# Patient Record
Sex: Male | Born: 1960 | Race: Black or African American | Hispanic: No | Marital: Married | Smoking: Never smoker
Health system: Southern US, Community
[De-identification: ages and names within clinical notes are randomized; demographics above are authoritative.]

## PROBLEM LIST (undated history)

## (undated) DIAGNOSIS — E7800 Pure hypercholesterolemia, unspecified: Secondary | ICD-10-CM

## (undated) DIAGNOSIS — I10 Essential (primary) hypertension: Secondary | ICD-10-CM

---

## 2006-08-16 ENCOUNTER — Ambulatory Visit: Payer: Self-pay | Admitting: Unknown Physician Specialty

## 2007-12-05 ENCOUNTER — Other Ambulatory Visit: Payer: Self-pay

## 2007-12-05 ENCOUNTER — Inpatient Hospital Stay: Payer: Self-pay | Admitting: Internal Medicine

## 2012-11-06 ENCOUNTER — Inpatient Hospital Stay: Payer: Self-pay | Admitting: Student

## 2012-11-06 LAB — CK TOTAL AND CKMB (NOT AT ARMC)
CK, Total: 162 U/L (ref 35–232)
CK, Total: 306 U/L — ABNORMAL HIGH (ref 35–232)
CK-MB: 3.1 ng/mL (ref 0.5–3.6)
CK-MB: 18.5 ng/mL — ABNORMAL HIGH (ref 0.5–3.6)
CK-MB: 23 ng/mL — ABNORMAL HIGH (ref 0.5–3.6)

## 2012-11-06 LAB — APTT
Activated PTT: 138.7 secs — ABNORMAL HIGH (ref 23.6–35.9)
Activated PTT: >160 secs (ref 23.6–35.9)

## 2012-11-06 LAB — CBC WITH DIFFERENTIAL/PLATELET
Basophil #: 0.1 10*3/uL (ref 0.0–0.1)
Basophil %: 1.2 %
Eosinophil %: 1.4 %
HGB: 15 g/dL (ref 13.0–18.0)
Lymphocyte %: 25.3 %
MCH: 30.4 pg (ref 26.0–34.0)
MCV: 88 fL (ref 80–100)
Monocyte #: 0.7 x10 3/mm (ref 0.2–1.0)
Monocyte %: 8.6 %
Neutrophil %: 63.5 %
Platelet: 221 10*3/uL (ref 150–440)
RBC: 4.93 10*6/uL (ref 4.40–5.90)
WBC: 8.5 10*3/uL (ref 3.8–10.6)

## 2012-11-06 LAB — COMPREHENSIVE METABOLIC PANEL
Albumin: 3.7 g/dL (ref 3.4–5.0)
Alkaline Phosphatase: 117 U/L (ref 50–136)
Anion Gap: 8 (ref 7–16)
BUN: 12 mg/dL (ref 7–18)
Chloride: 109 mmol/L — ABNORMAL HIGH (ref 98–107)
Co2: 25 mmol/L (ref 21–32)
EGFR (African American): >60
Glucose: 100 mg/dL — ABNORMAL HIGH (ref 65–99)
Osmolality: 283 (ref 275–301)
Potassium: 3.7 mmol/L (ref 3.5–5.1)
SGOT(AST): 28 U/L (ref 15–37)
SGPT (ALT): 24 U/L (ref 12–78)
Sodium: 142 mmol/L (ref 136–145)
Total Protein: 7.7 g/dL (ref 6.4–8.2)

## 2012-11-06 LAB — CBC
HGB: 15 g/dL (ref 13.0–18.0)
MCH: 29.8 pg (ref 26.0–34.0)
MCV: 88 fL (ref 80–100)
Platelet: 223 10*3/uL (ref 150–440)
RBC: 5.04 10*6/uL (ref 4.40–5.90)
RDW: 13.8 % (ref 11.5–14.5)
WBC: 8.4 10*3/uL (ref 3.8–10.6)

## 2012-11-06 LAB — TROPONIN I: Troponin-I: 2.4 ng/mL — ABNORMAL HIGH

## 2012-11-07 LAB — CBC WITH DIFFERENTIAL/PLATELET
Basophil #: 0.1 10*3/uL (ref 0.0–0.1)
Basophil %: 1.5 %
HCT: 42.1 % (ref 40.0–52.0)
HGB: 14.4 g/dL (ref 13.0–18.0)
Lymphocyte %: 36.8 %
MCHC: 34.1 g/dL (ref 32.0–36.0)
MCV: 90 fL (ref 80–100)
Monocyte %: 8.6 %
Neutrophil #: 4 10*3/uL (ref 1.4–6.5)
WBC: 7.8 10*3/uL (ref 3.8–10.6)

## 2012-11-07 LAB — LIPID PANEL
HDL Cholesterol: 42 mg/dL (ref 40–60)
Ldl Cholesterol, Calc: 149 mg/dL — ABNORMAL HIGH (ref 0–100)

## 2012-11-07 LAB — BASIC METABOLIC PANEL
Calcium, Total: 8.7 mg/dL (ref 8.5–10.1)
Co2: 21 mmol/L (ref 21–32)
Creatinine: 0.7 mg/dL (ref 0.60–1.30)
Glucose: 102 mg/dL — ABNORMAL HIGH (ref 65–99)
Osmolality: 282 (ref 275–301)
Potassium: 4.3 mmol/L (ref 3.5–5.1)
Sodium: 142 mmol/L (ref 136–145)

## 2012-11-07 LAB — APTT: Activated PTT: 98.6 secs — ABNORMAL HIGH (ref 23.6–35.9)

## 2012-11-08 LAB — CK TOTAL AND CKMB (NOT AT ARMC)
CK, Total: 143 U/L (ref 35–232)
CK-MB: 5.5 ng/mL — ABNORMAL HIGH (ref 0.5–3.6)

## 2012-11-08 LAB — BASIC METABOLIC PANEL
Anion Gap: 6 — ABNORMAL LOW (ref 7–16)
Calcium, Total: 8.6 mg/dL (ref 8.5–10.1)
Co2: 26 mmol/L (ref 21–32)
Creatinine: 0.85 mg/dL (ref 0.60–1.30)
EGFR (African American): >60
EGFR (Non-African Amer.): >60
Osmolality: 276 (ref 275–301)
Potassium: 3.7 mmol/L (ref 3.5–5.1)

## 2014-10-01 DIAGNOSIS — I2510 Atherosclerotic heart disease of native coronary artery without angina pectoris: Secondary | ICD-10-CM | POA: Diagnosis present

## 2014-12-10 DIAGNOSIS — E782 Mixed hyperlipidemia: Secondary | ICD-10-CM | POA: Insufficient documentation

## 2015-03-01 ENCOUNTER — Emergency Department: Payer: Self-pay | Admitting: Emergency Medicine

## 2015-04-13 NOTE — H&P (Signed)
PATIENT NAME:  Nathaniel Hobbs, Nathaniel Hobbs MR#:  [id number] DATE OF BIRTH:  1961-09-14  DATE OF ADMISSION:  11/06/2012  PRIMARY CARE PHYSICIAN: [facility], evaluated by [facility] in the past   CHIEF COMPLAINT: Chest pressure.   HISTORY OF PRESENT ILLNESS: The patient is a 54 year old pleasant African American male with a past medical history of acute myocardial infarction status post cardiac cath in the year 2008 by Dr. Gwen Pounds which revealed an ejection fraction of 60%, proximal left anterior descending 40% stenosis, first diagonal 80% stenosis regarding which medical management was recommended in the year 2008. The patient has other medical problems of hypertension and hyperlipidemia. He was in his usual state of health until 6 p.m. yesterday. While he was resting suddenly he started having chest pressure in the midsternal area close to the neck which radiated to right shoulder and shoulder started cramping at around 6 p.m. Eventually he felt like heartburn and he took antacid. Eventually he felt diffuse chest pressure both on the right and left side of the chest and felt like someone was sitting on his chest. This was not associated with any nausea, vomiting, dizziness, shortness of breath, or diaphoresis. Denies any headache or blurry vision. The patient took two 81 mg aspirin following which he felt a little better. The patient's wife drove him to the ER. In the ER the patient was given one sublingual nitroglycerin following which his chest pressure was significantly improved. The patient's cardiac enzymes have revealed a total CK of 162, CPK-MB 3.1, and Troponin-I at 0.35. Initial 12-lead EKG at 1:53 a.m. has revealed 1-1/2 small boxes of ST elevation from leads V1 through V5. The patient was given heparin bolus and heparin drip was initiated. Subsequently, the repeat EKG at 2:06 a.m. has revealed normal sinus rhythm at 72 beats per minute, normal PR interval at 160, and corrected QTc  of 407 ms. ST elevations which were noted on the prior EKG from leads V1 through V5 were resolved. Hospitalist team is called to admit the patient for acute MI. The patient was placed on oxygen. During my examination the patient is reporting that his chest pressure is significantly improved and he started feeling better. The patient felt comfortable to get admitted to [facility] for further management of his heart attack. I had called and notified Dr. Darrold Junker who is on call for [facility]. The patient's wife is at bedside during my examination.   PAST MEDICAL HISTORY:  1. Hypertension. 2. Hyperlipidemia. 3. Acute MI in the year 2008. At that time he had a cardiac cath and medical management was recommended.   PAST SURGICAL HISTORY: Cardiac cath in the year 2008 which was done by Dr. Gwen Pounds, showed ejection fraction of 50%, proximal left anterior descending 40% stenosis and first diagonal 80% stenosis. Medical management was recommended.   ALLERGIES: The patient has no known drug allergies.   HOME MEDICATION LIST:  1. Lisinopril 20 mg 1 tablet p.o. once daily.  2. Lipitor 10 mg once daily. 3. Aspirin 325 mg once daily.   PSYCHOSOCIAL HISTORY: Lives at home with wife. Denies smoking. Occasional intake of alcohol. Denies any illicit drug intake.    FAMILY HISTORY: Father had heart attack at age 63.  REVIEW OF SYSTEMS: CONSTITUTIONAL: Denies any fever, fatigue, weakness, pain, weight loss or weight gain. EYES: Denies any blurry vision, double vision, redness, tinnitus, postnasal drip. ENT: Denies postnasal drip, snoring, or difficulty in swallowing. RESPIRATORY: Denies coughing, wheezing, COPD, tuberculosis, or  pneumonia. CARDIOVASCULAR: Complained of chest pressure which is resolved after giving sublingual nitroglycerin. Denies any syncope, varicose veins, or palpitations. GI: Denies nausea, vomiting, diarrhea, abdominal pain, hematemesis, melena, ulcers, or  GERD. GENITOURINARY: Denies dysuria, hematuria. ENDOCRINE: Denies polyuria, polydipsia, nocturia. HEME/LYMPH: Denies anemia, easy bruising or bleeding. INTEGUMENTARY: Denies acne, rash, lesions. MUSCULOSKELETAL: The patient is complaining of right shoulder cramping but denies any knee pain, hip pain, swelling, gout. NEUROLOGIC: The patient denies any numbness, weakness, dysarthria, dementia, memory loss. PSYCH: The patient denies any insomnia, ADD, bipolar disorder, or depression.   PHYSICAL EXAMINATION:   VITAL SIGNS: Temperature 98.5, pulse 68, respiratory rate 18, blood pressure 156/92.   GENERAL APPEARANCE: Not in acute distress, well built and well nourished.   HEENT: Normocephalic, atraumatic. Pupils are equal and reactive to light and accommodation. Moist mucous membranes. No postnasal drip.   NECK: Supple. No JVD. No carotid bruits. No thyromegaly.   LUNGS: Clear to auscultation bilaterally. No wheezing. No crackles.   CARDIAC: S1, S2 normal. Regular rate and rhythm. No reproducible chest tenderness on palpation. Point of maximal index is intact. No thrills.   GI: Soft. Bowel sounds are positive in all four quadrants. Nontender, nondistended. No masses.   NEUROLOGIC: Alert and oriented x3. Cranial nerves II through XII are grossly intact. Motor and sensory intact. Reflexes are 2+.   SIGNIFICANT LABS AND IMAGING STUDIES: Serum glucose 100, sodium 142 potassium 3.7, chloride 109, CO2 25, BUN 12, creatinine 0.97, calcium 8.6, serum osmolality 283, anion gap 8, GFR greater than 60, total protein 7.7, albumin 3.7, AST 28, ALT 24. CK total 162. CK-MB 3.1. Troponin-I 0.35. WBC 8400, hemoglobin 15.0, hematocrit 44.5, platelet count 223,000, MCV 88.   Chest x-ray has revealed prominent vasculature and peribronchial cuffing.   12-lead EKG at 1:53 a.m. has revealed 1-1/2 small boxes, ST elevations from lead V1 through V5. Repeat EKG at 2:06 a.m. has revealed normal sinus rhythm at 72 beats per  minute. The ST elevations from V1 through V5 which were noted at 1:53 a.m. were resolved. PR interval is normal. Carter QTc at 407 ms.   ASSESSMENT AND PLAN: This is a 54 year old African American male presenting to the ER with a chief complaint of chest pressure with radiation to the right shoulder with cramping who will be admitted with the following assessment and plan: 1. Acute MI status post resolution of ST elevation/positive troponin with past medical history of coronary artery disease, acute MI in the year 2008, status post cardiac cath in 2008 and recommended medical management. Admit to Intensive Care Unit. Heparin bolus was given in the ER and will continue heparin drip. ACS protocol will be implemented with oxygen, nitroglycerin, aspirin, beta-blocker, statin, and ACE inhibitor. Will also provide him Plavix. Cardiology consult is placed and notified Dr. Darrold Junker. Discussed about diagnosis and lab results. Dr. Darrold Junker' group will see the patient as soon as possible. Will check fasting lipid panel.  2. Hypertension. Blood pressure is slightly elevated. Will resume his home medication, lisinopril, and restart metoprolol.  3. Hyperlipidemia. Check cholesterol panel in a.m. Will continue statin.  4. GI prophylaxis will be given with Protonix. 5. DVT prophylaxis is not needed as the patient is on heparin drip.   CODE STATUS: FULL CODE.   The diagnosis and plan of care was discussed in detail with the patient and his wife who is at bedside. They both voiced understanding of the plan.   CONDITION: Critical.   TOTAL CRITICAL CARE TIME SPENT: 60 minutes.  ____________________________ Ramonita LabAruna Wise Fees, MD ag:drc D: 11/06/2012 05:18:43 ET T: 11/06/2012 07:09:38 ET JOB#: [id number]  cc: Ramonita LabAruna Zebulin Siegel, MD, <Dictator> Marcina MillardAlexander Paraschos, MD Ramonita LabARUNA Braxtyn Bojarski MD ELECTRONICALLY SIGNED 11/16/2012 0:54

## 2015-04-13 NOTE — Consult Note (Signed)
General Aspect 54 yo male with history of cad by cardeiac cath in 2009 revealing normal lv function, 40% mid lad, 80% D1 wsmall veswsel treated medically. He also has hypertension and hyperlipidemia. He is now admitted iwth chest pain and has ruled in for a nstemi. He has not seen a cardiologist in 4 years. He states he has been compliant with his meds. He is currently hemodynamically stable. Mild chest tightness.   Physical Exam:   GEN no acute distress, obese    HEENT PERRL, hearing intact to voice    NECK supple    RESP normal resp effort    CARD Regular rate and rhythm  Normal, S1, S2  No murmur    ABD denies tenderness  normal BS    LYMPH negative neck    EXTR negative cyanosis/clubbing, negative edema    SKIN normal to palpation    NEURO cranial nerves intact, motor/sensory function intact    PSYCH A+O to time, place, person   Review of Systems:   Subjective/Chief Complaint chest pain    General: No Complaints    Skin: No Complaints    ENT: No Complaints    Eyes: No Complaints    Neck: No Complaints    Respiratory: No Complaints    Cardiovascular: Chest pain or discomfort  Tightness    Gastrointestinal: No Complaints    Genitourinary: No Complaints    Vascular: No Complaints    Musculoskeletal: No Complaints    Neurologic: No Complaints    Hematologic: No Complaints    Endocrine: No Complaints    Psychiatric: No Complaints    Review of Systems: All other systems were reviewed and found to be negative    Medications/Allergies Reviewed Medications/Allergies reviewed     Hypercholesterolemia:    MI:    Denies medical history:    arthroscopic R knee:   Home Medications: Medication Instructions Status  lisinopril tablet 20 mg 1 tab(s) orally once a day  Active  aspirin tablet 325 mg 1 tab(s) orally once a day  Active  Lipitor 10 mg oral tablet 1 tab(s) orally once a day (at bedtime) Active   EKG:   EKG NSR   Radiology  Results: XRay:    13-Nov-13 02:29, Chest PA and Lateral   Chest PA and Lateral    REASON FOR EXAM:    chest pain  COMMENTS:       PROCEDURE: DXR - DXR CHEST PA (OR AP) AND LATERAL  - Nov 06 2012  2:29AM     RESULT: The lungs are clear. The heart and pulmonary vessels are normal.   The bony and mediastinal structures are unremarkable. There is no   effusion. There is no pneumothorax or evidence of congestive failure.    IMPRESSION:  No acute cardiopulmonary disease.    Dictation Site: 2          Verified By: Elveria RoyalsGEOFFREY H. BROWNE, M.D., MD    No Known Allergies:     Impression 54 yo patient with complaints of chest pain who has ruled in for an nstemi with serum tropoinin of 3.1,. Had cardiac cath inn 2008 after nstemi which reveald mild lad disease with80% stenosis in small d1 treated medically. Now with recurrent chest pain. Hemodynaically stable. Had pain at rest prior to admission.    Plan 1. Continue current meds including asa, metoprolol, statin. Pt was put on plavix in er so will ontinue for now.  2. Risk and benefits of cardaic cath explained  to patient and he agrees to proceed.  3. Further recs after cath.   Electronic Signatures: Dalia Heading (MD)  (Signed [phone] 09:09)  Authored: General Aspect/Present Illness, History and Physical Exam, Review of System, Past Medical History, Home Medications, EKG , Radiology, Allergies, Impression/Plan   Last Updated: 13-Nov-13 09:09 by Dalia Heading (MD)

## 2015-04-13 NOTE — Discharge Summary (Signed)
PATIENT NAME:  Anastasio AuerbachDIGGINS, Markcus MR#:  [id number] DATE OF BIRTH:  [birth year]  DATE OF ADMISSION:  11/06/2012 DATE OF DISCHARGE:  11/08/2012  CHIEF COMPLAINT: Chest pain.   CONSULTANT: Dr. Lady GaryFath  [facility] PH[facility]IC[facility]  DISCHARGE DIAGNOSES:  1. Acute myocardial infarction status post staged catheter and stent to mid left anterior descending.   2. Hypertension.  3. Hyperlipidemia.  4. History of coronary artery disease status post myocardial infarction in the past.   DISCHARGE MEDICATIONS:  1. Plavix 75 mg daily.  2. Enalapril 10 mg daily.  3. Aspirin 81 mg daily.  4. Nitroglycerin sublingual 0.4 mg every five minutes x3 as needed for chest pain. 5. Simvastatin 40 mg daily.   6. Metoprolol tartrate 25 mg 2 times a day.   DIET: Low sodium.   ACTIVITY: As tolerated.   FOLLOW UP: Please follow with your primary care physician and cardiologist within 1 to 2 weeks and Dr. Lady GaryFath will give you an appointment and please follow with him.   DISPOSITION: Home.   HISTORY OF PRESENT ILLNESS: For full details of history and physical, please see the dictation on 11/06/2012 by Dr. Amado CoeGouru but briefly this is a 54 year old male with history of coronary artery disease status post catheterization and myocardial infarction in the past who presented with chest pain and on arrival had significant ST changes and was admitted to the hospitalist service with heparin drip, aspirin, Plavix and cardiology was consulted.   LABORATORY, DIAGNOSTIC AND RADIOLOGICAL DATA: Initial BUN 12, creatinine 0.97. Today creatinine is 0.85. LDL 149, total cholesterol 234, serum triglycerides 214. LFTs on arrival within normal limits. Initial troponin 0.35, next troponin 2.4, next troponin 3.8. Initial CK-MB 3.1, peak was 23, last CK-MB 5.5. WBC 8.4 on arrival, hemoglobin 15, platelets 223. Cardiac catheterization on 11/13 showing severe apical hypokinesia, 10% distal left main stenosis, mid LAD there was 95% stenosis,  first diagonal there was 100% stenosis, second diagonal there was 75% stenosis, proximal circ there was 75% stenosis, first obtuse marginal there was 75% stenosis.   HOSPITAL COURSE: Patient was admitted to the hospitalist service with heparin drip, aspirin, Plavix and cardiology was consulted. Patient had significant ST changes on arrival and symptoms improved with nitroglycerin. Patient was taken to the Cath Lab on 11/13 the result of which is above and the culprit vessel was the LAD lesion. He was taken back to the floor and per Dr. Lady GaryFath the images [facility] and decision to retake the patient to the Cath Lab for stenting of the mid LAD lesion was made. He underwent successful stenting on the 14th and currently is chest pain free. I had several conversations with the patient in regards to the progressive nature of his coronary artery disease and the possible need for CABG down the line. The CK-MB has trended down and he is chest pain free. He was instructed to follow with Dr. Lady GaryFath and his PCP. An echocardiogram done on 11/13 showed ejection fraction of 45% to 50%. This is likely cardiomyopathy and not acute congestive heart failure. He will be discharged with beta blocker and ACE inhibitor in addition to his other medications dictated above.   TOTAL TIME SPENT: 35 minutes.   CODE STATUS: Patient is FULL CODE.  ____________________________ Krystal EatonShayiq Janellie Tennison, MD sa:cms D: 11/08/2012 09:28:50 ET T: 11/08/2012 10:57:05 ET JOB#: [id number] cc: Krystal EatonShayiq Kesler Wickham, MD, <Dictator> [facility] Marcelle SmilingSHAYIQ [facility] MD ELECTRONICALLY SIGNED 12/05/2012 11:03

## 2015-10-28 DIAGNOSIS — I10 Essential (primary) hypertension: Secondary | ICD-10-CM | POA: Diagnosis present

## 2015-11-02 DIAGNOSIS — F411 Generalized anxiety disorder: Secondary | ICD-10-CM | POA: Insufficient documentation

## 2016-06-01 IMAGING — CR DG CHEST 2V
1 series · 2 of 2 positions shown · non-contrast
Comparison: 11/06/2012

CLINICAL DATA: Anterior chest pain for 1 week

EXAM:
CHEST  2 VIEW

[Series 1: dxr chest pa (or ap) and lateral · 0.14mm/px · 2 of 2 slices shown]
[im 1/2]
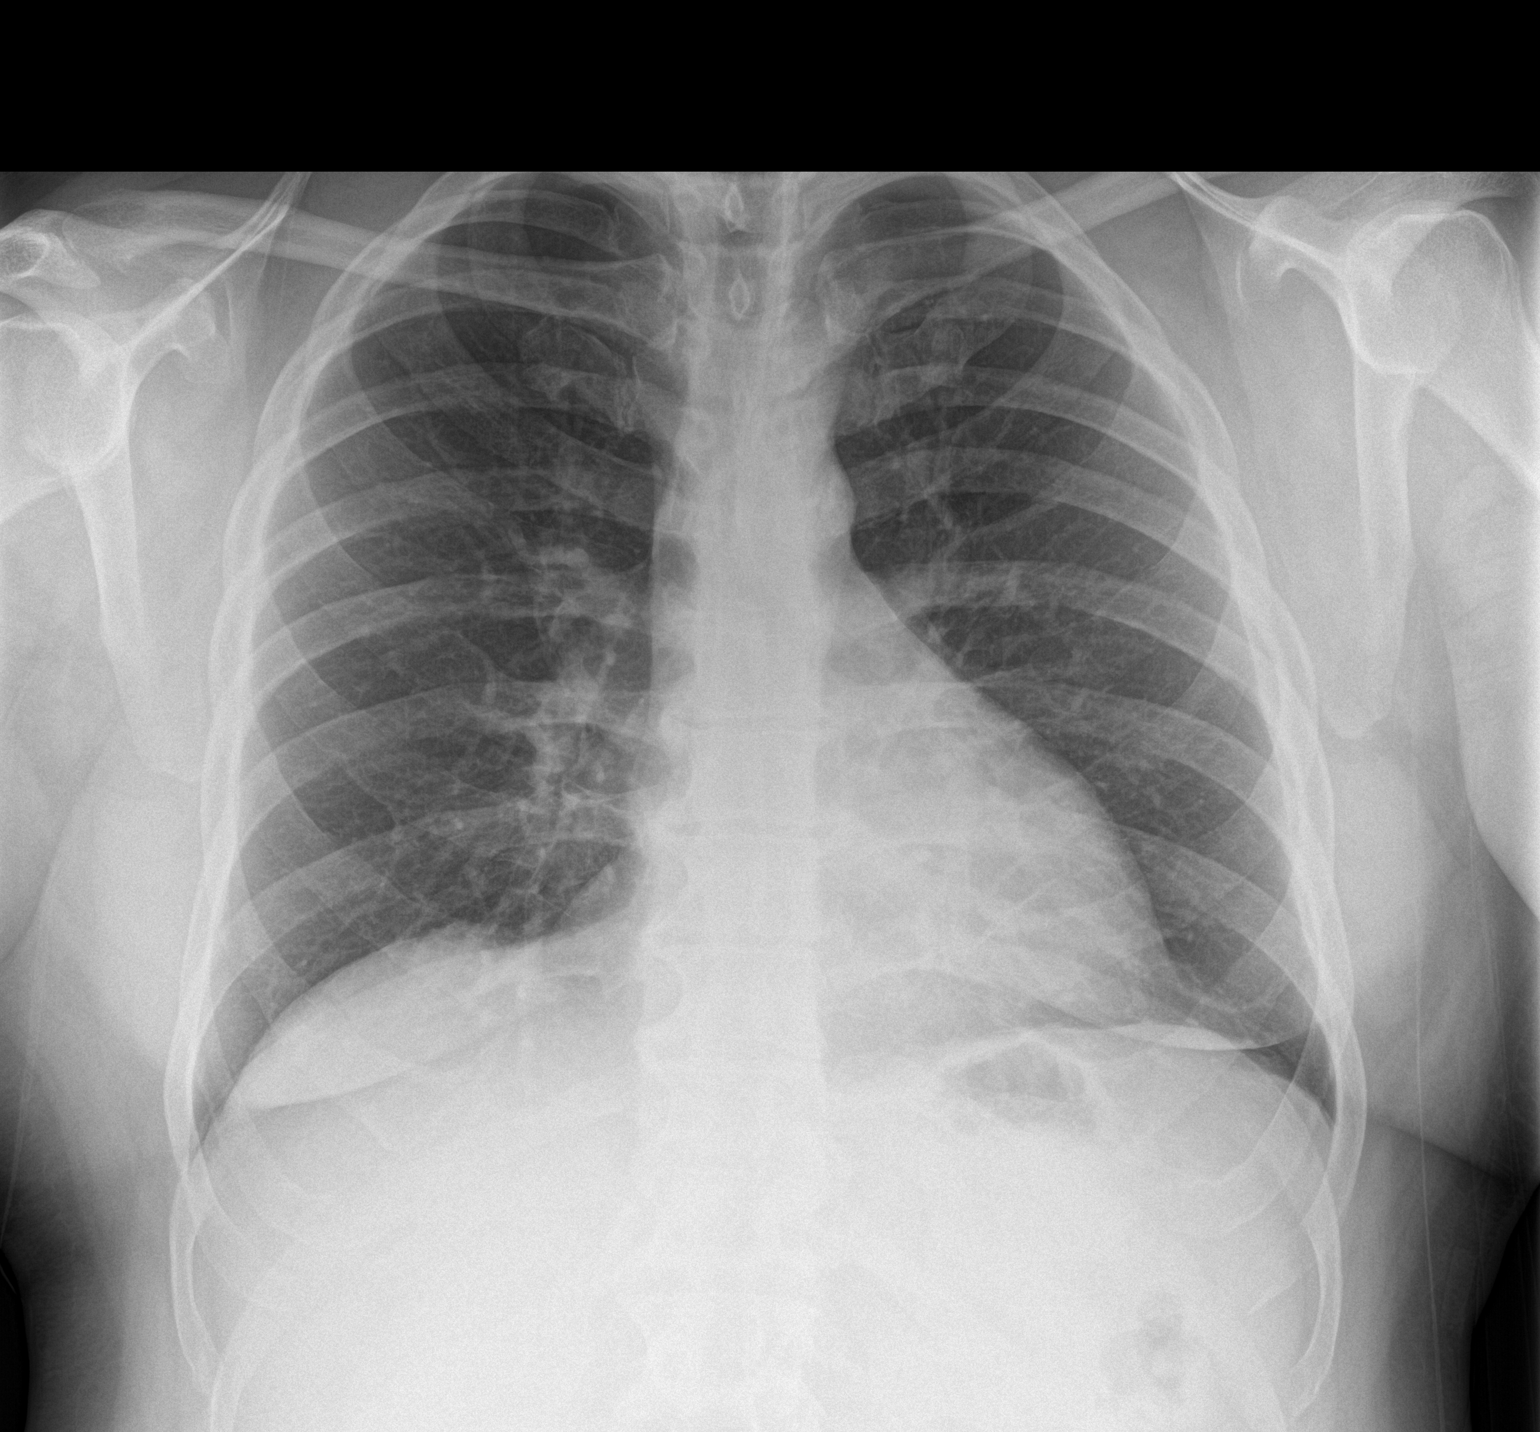
[im 2/2]
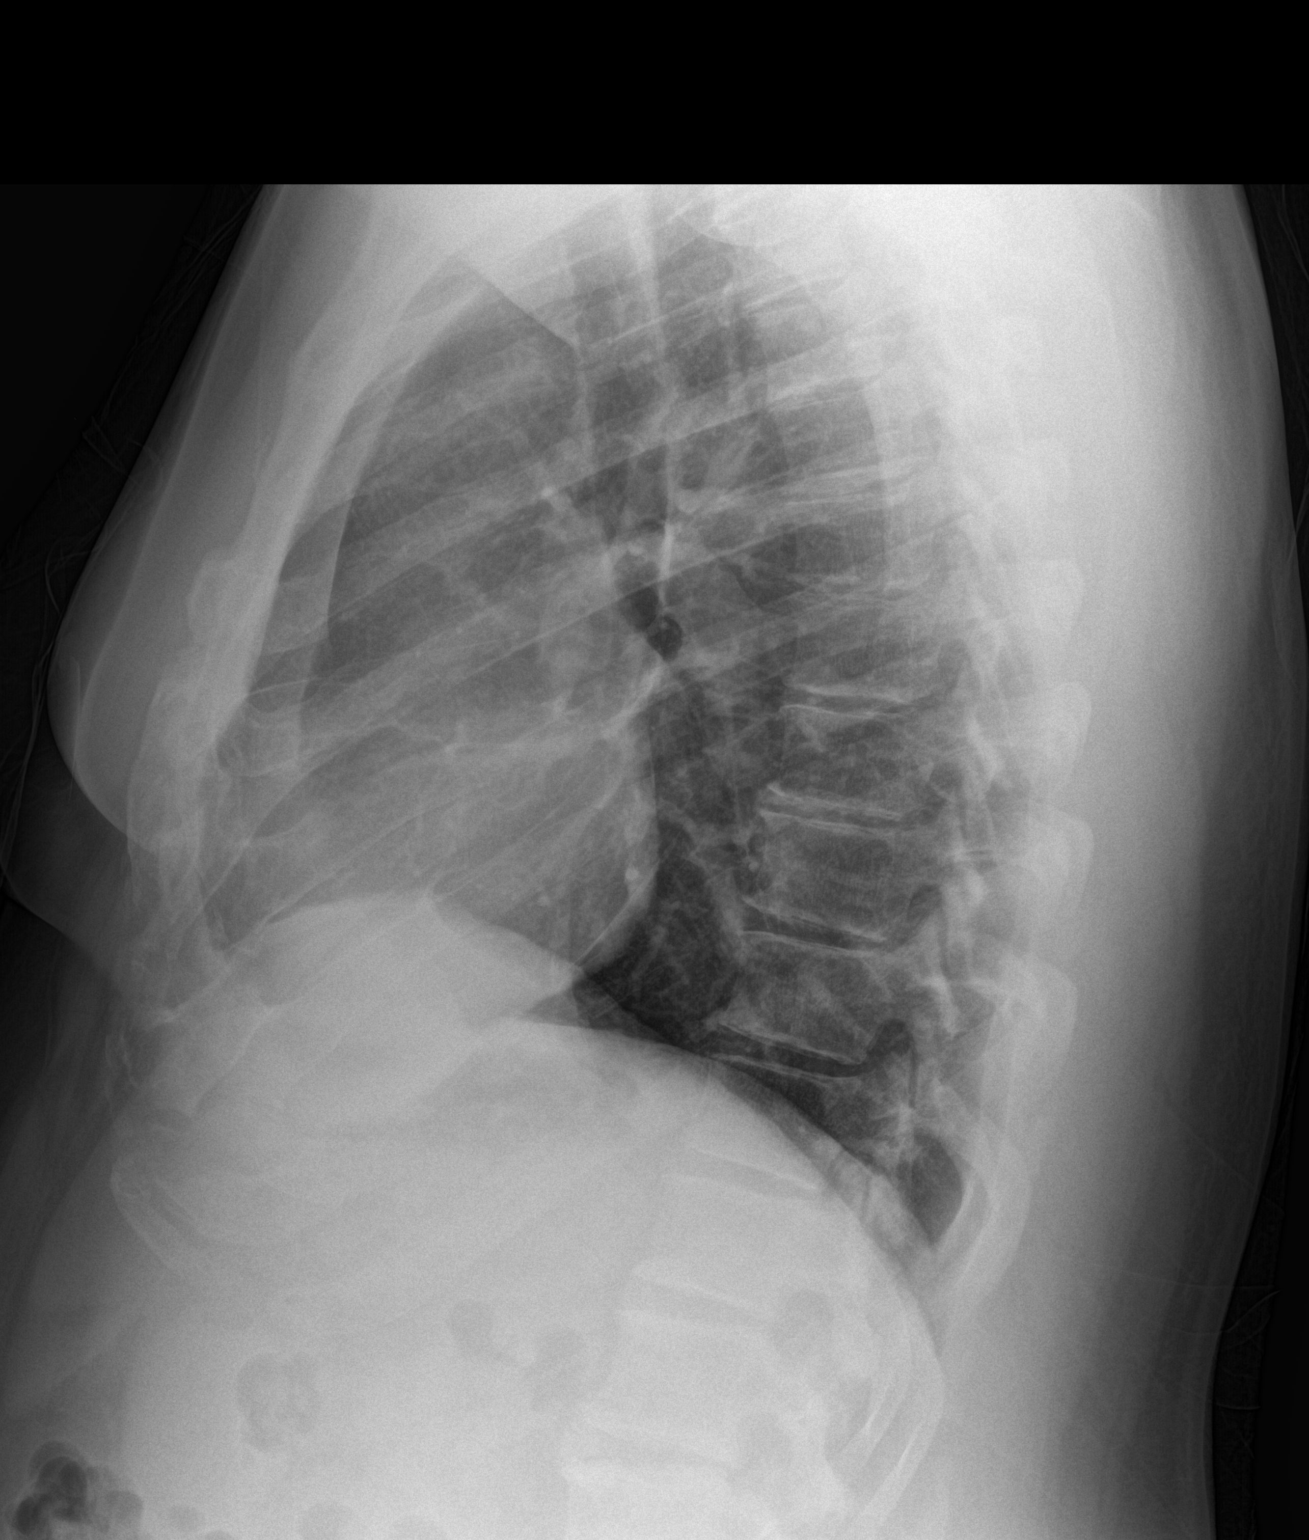

[2 of 2 positions shown; findings below may reference images not displayed]

FINDINGS: Cardiomediastinal silhouette is stable. No acute infiltrate or
pleural effusion. No pulmonary edema. Mild degenerative changes
lower thoracic spine.
IMPRESSION: No active cardiopulmonary disease.

## 2018-03-28 DIAGNOSIS — R739 Hyperglycemia, unspecified: Secondary | ICD-10-CM | POA: Insufficient documentation

## 2018-03-28 DIAGNOSIS — G8929 Other chronic pain: Secondary | ICD-10-CM | POA: Insufficient documentation

## 2022-01-25 DIAGNOSIS — Z0000 Encounter for general adult medical examination without abnormal findings: Secondary | ICD-10-CM | POA: Diagnosis not present

## 2022-01-25 DIAGNOSIS — I10 Essential (primary) hypertension: Secondary | ICD-10-CM | POA: Diagnosis not present

## 2022-01-25 DIAGNOSIS — E782 Mixed hyperlipidemia: Secondary | ICD-10-CM | POA: Diagnosis not present

## 2022-01-25 DIAGNOSIS — R200 Anesthesia of skin: Secondary | ICD-10-CM | POA: Diagnosis not present

## 2022-01-25 DIAGNOSIS — R7303 Prediabetes: Secondary | ICD-10-CM | POA: Diagnosis not present

## 2022-01-30 DIAGNOSIS — R200 Anesthesia of skin: Secondary | ICD-10-CM | POA: Diagnosis not present

## 2022-01-30 DIAGNOSIS — R208 Other disturbances of skin sensation: Secondary | ICD-10-CM | POA: Diagnosis not present

## 2022-01-30 DIAGNOSIS — R29898 Other symptoms and signs involving the musculoskeletal system: Secondary | ICD-10-CM | POA: Diagnosis not present

## 2022-02-20 DIAGNOSIS — R200 Anesthesia of skin: Secondary | ICD-10-CM | POA: Diagnosis not present

## 2022-03-06 DIAGNOSIS — R202 Paresthesia of skin: Secondary | ICD-10-CM | POA: Diagnosis not present

## 2022-03-06 DIAGNOSIS — R29898 Other symptoms and signs involving the musculoskeletal system: Secondary | ICD-10-CM | POA: Diagnosis not present

## 2022-03-06 DIAGNOSIS — R200 Anesthesia of skin: Secondary | ICD-10-CM | POA: Diagnosis not present

## 2022-03-06 DIAGNOSIS — G5603 Carpal tunnel syndrome, bilateral upper limbs: Secondary | ICD-10-CM | POA: Diagnosis not present

## 2022-05-10 DIAGNOSIS — M549 Dorsalgia, unspecified: Secondary | ICD-10-CM | POA: Diagnosis not present

## 2022-05-10 DIAGNOSIS — Z96643 Presence of artificial hip joint, bilateral: Secondary | ICD-10-CM | POA: Diagnosis not present

## 2022-05-10 DIAGNOSIS — M47816 Spondylosis without myelopathy or radiculopathy, lumbar region: Secondary | ICD-10-CM | POA: Diagnosis not present

## 2022-05-10 DIAGNOSIS — Z125 Encounter for screening for malignant neoplasm of prostate: Secondary | ICD-10-CM | POA: Diagnosis not present

## 2022-05-10 DIAGNOSIS — Z23 Encounter for immunization: Secondary | ICD-10-CM | POA: Diagnosis not present

## 2022-05-10 DIAGNOSIS — M545 Low back pain, unspecified: Secondary | ICD-10-CM | POA: Diagnosis not present

## 2022-05-10 DIAGNOSIS — M546 Pain in thoracic spine: Secondary | ICD-10-CM | POA: Diagnosis not present

## 2022-06-01 DIAGNOSIS — M458 Ankylosing spondylitis sacral and sacrococcygeal region: Secondary | ICD-10-CM | POA: Diagnosis not present

## 2022-08-01 DIAGNOSIS — Z791 Long term (current) use of non-steroidal anti-inflammatories (NSAID): Secondary | ICD-10-CM | POA: Diagnosis not present

## 2022-08-01 DIAGNOSIS — M533 Sacrococcygeal disorders, not elsewhere classified: Secondary | ICD-10-CM | POA: Diagnosis not present

## 2022-08-01 DIAGNOSIS — M47816 Spondylosis without myelopathy or radiculopathy, lumbar region: Secondary | ICD-10-CM | POA: Diagnosis not present

## 2022-11-01 DIAGNOSIS — M47816 Spondylosis without myelopathy or radiculopathy, lumbar region: Secondary | ICD-10-CM | POA: Diagnosis not present

## 2022-11-01 DIAGNOSIS — Z791 Long term (current) use of non-steroidal anti-inflammatories (NSAID): Secondary | ICD-10-CM | POA: Diagnosis not present

## 2022-11-02 ENCOUNTER — Emergency Department: Payer: 59

## 2022-11-02 ENCOUNTER — Other Ambulatory Visit: Payer: Self-pay

## 2022-11-02 ENCOUNTER — Emergency Department
Admission: EM | Admit: 2022-11-02 | Discharge: 2022-11-02 | Disposition: A | Discharge status: Home / Self Care | Payer: 59 | Attending: Emergency Medicine | Admitting: Emergency Medicine

## 2022-11-02 DIAGNOSIS — R0789 Other chest pain: Secondary | ICD-10-CM | POA: Insufficient documentation

## 2022-11-02 DIAGNOSIS — I10 Essential (primary) hypertension: Secondary | ICD-10-CM | POA: Insufficient documentation

## 2022-11-02 DIAGNOSIS — I2510 Atherosclerotic heart disease of native coronary artery without angina pectoris: Secondary | ICD-10-CM | POA: Diagnosis not present

## 2022-11-02 DIAGNOSIS — R079 Chest pain, unspecified: Secondary | ICD-10-CM | POA: Diagnosis not present

## 2022-11-02 LAB — BASIC METABOLIC PANEL
Anion gap: 6 (ref 5–15)
BUN: 13 mg/dL (ref 6–20)
CO2: 25 mmol/L (ref 22–32)
Calcium: 9 mg/dL (ref 8.9–10.3)
Chloride: 108 mmol/L (ref 98–111)
Creatinine, Ser: 0.9 mg/dL (ref 0.61–1.24)
GFR, Estimated: >60 mL/min (ref >60)
Glucose, Bld: 132 mg/dL — ABNORMAL HIGH (ref 70–99)
Potassium: 3.7 mmol/L (ref 3.5–5.1)
Sodium: 139 mmol/L (ref 135–145)

## 2022-11-02 LAB — CBC
HCT: 46.2 % (ref 39.0–52.0)
Hemoglobin: 15.8 g/dL (ref 13.0–17.0)
MCH: 29.7 pg (ref 26.0–34.0)
MCHC: 34.2 g/dL (ref 30.0–36.0)
MCV: 86.8 fL (ref 80.0–100.0)
Platelets: 224 10*3/uL (ref 150–400)
RBC: 5.32 MIL/uL (ref 4.22–5.81)
RDW: 12.7 % (ref 11.5–15.5)
WBC: 7.1 10*3/uL (ref 4.0–10.5)
nRBC: 0 % (ref 0.0–0.2)

## 2022-11-02 LAB — TROPONIN I (HIGH SENSITIVITY)
Troponin I (High Sensitivity): 9 ng/L (ref <18)
Troponin I (High Sensitivity): 9 ng/L (ref <18)

## 2022-11-02 NOTE — ED Triage Notes (Signed)
Pt states coming in for chest pressure. Pt states some fluttering 2 days ago in the chest as well. Pt reports a history of 2 MI's as well as a cardiac stent placement. Pt reports not taking his BP medications today.

## 2022-11-02 NOTE — ED Notes (Signed)
EKG completed and given to provider. As per provider, pt should come back to a room. Charge RN notified as well as first Engineer, civil (consulting).

## 2022-11-02 NOTE — Discharge Instructions (Signed)
Your EKG, chest x-ray, and lab tests were all normal today.  Please follow-up with your doctor for further evaluation of your symptoms.

## 2022-11-02 NOTE — ED Notes (Signed)
Pt reports no further chest pressure - remains on cardiac monitor. NO acute distress, vitals updated.

## 2022-11-02 NOTE — ED Provider Notes (Signed)
[facility] Provider Note    Event Date/Time   First MD Initiated Contact with Patient 11/02/22 1217     (approximate)   History   Chief Complaint: Chest Pain (Chest Pressure.)   HPI  Nathaniel Hobbs is a 61 y.o. male with a history of hypertension hyperlipidemia and CAD status post MI and stent placement who comes the ED complaining intermittent chest pressure for the past 2 days.  Not exertional.  Worse lying down, better with being upright and walking.  No shortness of breath diaphoresis or vomiting.  Last for a few seconds at a time.  Nonradiating.  Patient reports last episode of this symptom was 9:00 AM today, lasting a few seconds and then resolving, no recurrence, currently asymptomatic.     Physical Exam   Triage Vital Signs: ED Triage Vitals [11/02/22 1150]  Enc Vitals Group     BP (!) 167/102     Pulse Rate 75     Resp 18     Temp 98.4 F (36.9 C)     Temp src      SpO2 99 %     Weight 227 lb (103 kg)     Height 5\' 9"  (1.753 m)     Head Circumference      Peak Flow      Pain Score 0     Pain Loc      Pain Edu?      Excl. in [place]?     Most recent vital signs: Vitals:   11/02/22 1500 11/02/22 1525  BP: (!) 170/89 (!) 150/67  Pulse: (!) 56 67  Resp: 20 16  Temp:    SpO2: 98% 98%    General: Awake, no distress.  CV:  Good peripheral perfusion.  Regular rate and rhythm.  Normal distal pulses Resp:  Normal effort.  Clear to auscultation bilaterally Abd:  No distention.  Soft nontender Other:  No chest wall tenderness   ED Results / Procedures / Treatments   Labs (all labs ordered are listed, but only abnormal results are displayed) Labs Reviewed  BASIC METABOLIC PANEL - Abnormal; Notable for the following components:      Result Value   Glucose, Bld 132 (*)    All other components within normal limits  CBC  TROPONIN I (HIGH SENSITIVITY)  TROPONIN I (HIGH SENSITIVITY)     EKG Interpreted by me Normal sinus rhythm  rate of 71.  Normal axis, normal intervals.  Normal QRS.  Scooping of ST segments in inferior and lateral leads.  No ST elevation.  Normal T waves.  No recent prior EKGs for comparison.   RADIOLOGY Chest x-ray interpreted by me, appears normal.  Radiology report reviewed   PROCEDURES:  Procedures   MEDICATIONS ORDERED IN ED: Medications - No data to display   IMPRESSION / MDM / [place] / ED COURSE  I reviewed the triage vital signs and the nursing notes.                              Differential diagnosis includes, but is not limited to, GERD, non-STEMI, musculoskeletal pain, anxiety  Patient's presentation is most consistent with acute presentation with potential threat to life or bodily function.  Patient presents with atypical chest pain.  Low suspicion for acute cardiovascular pathology, but due to his medical history, lack of comparison EKGs, labs and x-ray were obtained.  This is all unremarkable,  he remains asymptomatic and stable for discharge home.   Considering the patient's symptoms, medical history, and physical examination today, I have low suspicion for ACS, PE, TAD, pneumothorax, carditis, mediastinitis, pneumonia, CHF, or sepsis.        FINAL CLINICAL IMPRESSION(S) / ED DIAGNOSES   Final diagnoses:  Atypical chest pain     Rx / DC Orders   ED Discharge Orders     None        Note:  This document was prepared using Dragon voice recognition software and may include unintentional dictation errors.   Sharman Cheek, MD 11/02/22 [phone]

## 2023-03-02 DIAGNOSIS — I10 Essential (primary) hypertension: Secondary | ICD-10-CM | POA: Diagnosis not present

## 2023-03-02 DIAGNOSIS — R7303 Prediabetes: Secondary | ICD-10-CM | POA: Diagnosis not present

## 2023-03-02 DIAGNOSIS — H269 Unspecified cataract: Secondary | ICD-10-CM | POA: Diagnosis not present

## 2023-03-02 DIAGNOSIS — Z1331 Encounter for screening for depression: Secondary | ICD-10-CM | POA: Diagnosis not present

## 2023-03-02 DIAGNOSIS — M47816 Spondylosis without myelopathy or radiculopathy, lumbar region: Secondary | ICD-10-CM | POA: Diagnosis not present

## 2023-03-02 DIAGNOSIS — M533 Sacrococcygeal disorders, not elsewhere classified: Secondary | ICD-10-CM | POA: Diagnosis not present

## 2023-03-02 DIAGNOSIS — E782 Mixed hyperlipidemia: Secondary | ICD-10-CM | POA: Diagnosis not present

## 2023-03-02 DIAGNOSIS — Z0000 Encounter for general adult medical examination without abnormal findings: Secondary | ICD-10-CM | POA: Diagnosis not present

## 2023-03-02 DIAGNOSIS — Z125 Encounter for screening for malignant neoplasm of prostate: Secondary | ICD-10-CM | POA: Diagnosis not present

## 2023-03-23 DIAGNOSIS — A64 Unspecified sexually transmitted disease: Secondary | ICD-10-CM | POA: Diagnosis not present

## 2023-03-23 DIAGNOSIS — Z113 Encounter for screening for infections with a predominantly sexual mode of transmission: Secondary | ICD-10-CM | POA: Diagnosis not present

## 2023-03-28 DIAGNOSIS — Z113 Encounter for screening for infections with a predominantly sexual mode of transmission: Secondary | ICD-10-CM | POA: Diagnosis not present

## 2023-05-25 DIAGNOSIS — G5603 Carpal tunnel syndrome, bilateral upper limbs: Secondary | ICD-10-CM | POA: Diagnosis not present

## 2023-05-25 DIAGNOSIS — I10 Essential (primary) hypertension: Secondary | ICD-10-CM | POA: Diagnosis not present

## 2024-04-03 ENCOUNTER — Other Ambulatory Visit: Payer: Self-pay

## 2024-04-03 ENCOUNTER — Emergency Department: Admitting: Registered Nurse

## 2024-04-03 ENCOUNTER — Inpatient Hospital Stay

## 2024-04-03 ENCOUNTER — Emergency Department

## 2024-04-03 ENCOUNTER — Encounter
Admission: EM | Disposition: A | Discharge status: Rehabilitation Facility | Payer: Self-pay | Source: Home / Self Care | Attending: Internal Medicine

## 2024-04-03 ENCOUNTER — Inpatient Hospital Stay
Admission: EM | Admit: 2024-04-03 | Discharge: 2024-04-08 | DRG: 029 | Disposition: A | Discharge status: Rehabilitation Facility | Attending: Internal Medicine | Admitting: Internal Medicine

## 2024-04-03 ENCOUNTER — Encounter: Payer: Self-pay | Admitting: Intensive Care

## 2024-04-03 DIAGNOSIS — I25810 Atherosclerosis of coronary artery bypass graft(s) without angina pectoris: Secondary | ICD-10-CM | POA: Diagnosis not present

## 2024-04-03 DIAGNOSIS — G959 Disease of spinal cord, unspecified: Principal | ICD-10-CM

## 2024-04-03 DIAGNOSIS — Z955 Presence of coronary angioplasty implant and graft: Secondary | ICD-10-CM

## 2024-04-03 DIAGNOSIS — K592 Neurogenic bowel, not elsewhere classified: Secondary | ICD-10-CM | POA: Diagnosis present

## 2024-04-03 DIAGNOSIS — I2510 Atherosclerotic heart disease of native coronary artery without angina pectoris: Secondary | ICD-10-CM

## 2024-04-03 DIAGNOSIS — M4712 Other spondylosis with myelopathy, cervical region: Secondary | ICD-10-CM | POA: Diagnosis present

## 2024-04-03 DIAGNOSIS — R7303 Prediabetes: Secondary | ICD-10-CM | POA: Diagnosis present

## 2024-04-03 DIAGNOSIS — Z6834 Body mass index (BMI) 34.0-34.9, adult: Secondary | ICD-10-CM | POA: Diagnosis not present

## 2024-04-03 DIAGNOSIS — R350 Frequency of micturition: Secondary | ICD-10-CM | POA: Diagnosis not present

## 2024-04-03 DIAGNOSIS — Z7982 Long term (current) use of aspirin: Secondary | ICD-10-CM

## 2024-04-03 DIAGNOSIS — I10 Essential (primary) hypertension: Secondary | ICD-10-CM

## 2024-04-03 DIAGNOSIS — M4802 Spinal stenosis, cervical region: Secondary | ICD-10-CM

## 2024-04-03 DIAGNOSIS — E66811 Obesity, class 1: Secondary | ICD-10-CM | POA: Diagnosis present

## 2024-04-03 DIAGNOSIS — E7800 Pure hypercholesterolemia, unspecified: Secondary | ICD-10-CM | POA: Diagnosis present

## 2024-04-03 DIAGNOSIS — K5900 Constipation, unspecified: Secondary | ICD-10-CM | POA: Diagnosis not present

## 2024-04-03 DIAGNOSIS — G9589 Other specified diseases of spinal cord: Secondary | ICD-10-CM | POA: Diagnosis present

## 2024-04-03 DIAGNOSIS — N319 Neuromuscular dysfunction of bladder, unspecified: Secondary | ICD-10-CM | POA: Diagnosis not present

## 2024-04-03 DIAGNOSIS — E669 Obesity, unspecified: Secondary | ICD-10-CM

## 2024-04-03 DIAGNOSIS — R252 Cramp and spasm: Secondary | ICD-10-CM | POA: Diagnosis present

## 2024-04-03 DIAGNOSIS — R609 Edema, unspecified: Secondary | ICD-10-CM | POA: Diagnosis not present

## 2024-04-03 DIAGNOSIS — Z79899 Other long term (current) drug therapy: Secondary | ICD-10-CM | POA: Diagnosis not present

## 2024-04-03 DIAGNOSIS — R531 Weakness: Secondary | ICD-10-CM

## 2024-04-03 DIAGNOSIS — K5909 Other constipation: Secondary | ICD-10-CM | POA: Diagnosis not present

## 2024-04-03 DIAGNOSIS — G8250 Quadriplegia, unspecified: Secondary | ICD-10-CM | POA: Diagnosis not present

## 2024-04-03 HISTORY — DX: Essential (primary) hypertension: I10

## 2024-04-03 HISTORY — DX: Pure hypercholesterolemia, unspecified: E78.00

## 2024-04-03 HISTORY — PX: ANTERIOR CERVICAL DECOMP/DISCECTOMY FUSION: SHX1161

## 2024-04-03 LAB — CBC WITH DIFFERENTIAL/PLATELET
Abs Immature Granulocytes: 0.01 10*3/uL (ref 0.00–0.07)
Basophils Absolute: 0.1 10*3/uL (ref 0.0–0.1)
Basophils Relative: 1 %
Eosinophils Absolute: 0.1 10*3/uL (ref 0.0–0.5)
Eosinophils Relative: 1 %
HCT: 43.8 % (ref 39.0–52.0)
Hemoglobin: 15.1 g/dL (ref 13.0–17.0)
Immature Granulocytes: 0 %
Lymphocytes Relative: 18 %
Lymphs Abs: 1.3 10*3/uL (ref 0.7–4.0)
MCH: 30.9 pg (ref 26.0–34.0)
MCHC: 34.5 g/dL (ref 30.0–36.0)
MCV: 89.8 fL (ref 80.0–100.0)
Monocytes Absolute: 0.5 10*3/uL (ref 0.1–1.0)
Monocytes Relative: 7 %
Neutro Abs: 5.2 10*3/uL (ref 1.7–7.7)
Neutrophils Relative %: 73 %
Platelets: 216 10*3/uL (ref 150–400)
RBC: 4.88 MIL/uL (ref 4.22–5.81)
RDW: 13.4 % (ref 11.5–15.5)
WBC: 7.1 10*3/uL (ref 4.0–10.5)
nRBC: 0 % (ref 0.0–0.2)

## 2024-04-03 LAB — COMPREHENSIVE METABOLIC PANEL WITH GFR
ALT: 16 U/L (ref 0–44)
AST: 21 U/L (ref 15–41)
Albumin: 4 g/dL (ref 3.5–5.0)
Alkaline Phosphatase: 111 U/L (ref 38–126)
Anion gap: 7 (ref 5–15)
BUN: 11 mg/dL (ref 6–20)
CO2: 24 mmol/L (ref 22–32)
Calcium: 8.7 mg/dL — ABNORMAL LOW (ref 8.9–10.3)
Chloride: 108 mmol/L (ref 98–111)
Creatinine, Ser: 0.82 mg/dL (ref 0.61–1.24)
GFR, Estimated: >60 mL/min (ref >60)
Glucose, Bld: 108 mg/dL — ABNORMAL HIGH (ref 70–99)
Potassium: 3.9 mmol/L (ref 3.5–5.1)
Sodium: 139 mmol/L (ref 135–145)
Total Bilirubin: 0.5 mg/dL (ref 0.0–1.2)
Total Protein: 7.3 g/dL (ref 6.5–8.1)

## 2024-04-03 SURGERY — ANTERIOR CERVICAL DECOMPRESSION/DISCECTOMY FUSION 2 LEVELS
Anesthesia: General

## 2024-04-03 MED ORDER — SURGIFLO WITH THROMBIN (HEMOSTATIC MATRIX KIT) OPTIME
TOPICAL | Status: DC | PRN
  Administered 2024-04-03: 1 via TOPICAL

## 2024-04-03 MED ORDER — METHOCARBAMOL 500 MG PO TABS
ORAL_TABLET | ORAL | Status: AC
Start: 2024-04-03 — End: ?
  Filled 2024-04-03: qty 1

## 2024-04-03 MED ORDER — KETOROLAC TROMETHAMINE 15 MG/ML IJ SOLN
15.0000 mg | Freq: Once | INTRAMUSCULAR | Status: AC
  Administered 2024-04-03: 15 mg via INTRAVENOUS

## 2024-04-03 MED ORDER — 0.9 % SODIUM CHLORIDE (POUR BTL) OPTIME
TOPICAL | Status: DC | PRN
  Administered 2024-04-03: 500 mL

## 2024-04-03 MED ORDER — ONDANSETRON HCL 4 MG/2ML IJ SOLN
INTRAMUSCULAR | Status: AC
  Filled 2024-04-03: qty 2

## 2024-04-03 MED ORDER — OXYCODONE HCL 5 MG PO TABS
5.0000 mg | ORAL_TABLET | Freq: Once | ORAL | Status: AC | PRN
  Administered 2024-04-03: 5 mg via ORAL

## 2024-04-03 MED ORDER — ONDANSETRON HCL 4 MG/2ML IJ SOLN
4.0000 mg | Freq: Four times a day (QID) | INTRAMUSCULAR | Status: DC | PRN
  Administered 2024-04-04: 4 mg via INTRAVENOUS
  Filled 2024-04-03: qty 2

## 2024-04-03 MED ORDER — REMIFENTANIL HCL 1 MG IV SOLR
INTRAVENOUS | Status: DC | PRN
  Administered 2024-04-03: .1 ug/kg/min via INTRAVENOUS

## 2024-04-03 MED ORDER — MIDAZOLAM HCL 2 MG/2ML IJ SOLN
INTRAMUSCULAR | Status: DC | PRN
  Administered 2024-04-03: 2 mg via INTRAVENOUS

## 2024-04-03 MED ORDER — HYDROCODONE-ACETAMINOPHEN 5-325 MG PO TABS
2.0000 | ORAL_TABLET | Freq: Once | ORAL | Status: AC
  Administered 2024-04-03: 2 via ORAL
  Filled 2024-04-03: qty 2

## 2024-04-03 MED ORDER — ENALAPRIL MALEATE 10 MG PO TABS
10.0000 mg | ORAL_TABLET | Freq: Two times a day (BID) | ORAL | Status: DC
  Administered 2024-04-04 – 2024-04-08 (×8): 10 mg via ORAL
  Filled 2024-04-03 (×9): qty 1

## 2024-04-03 MED ORDER — CEFAZOLIN SODIUM-DEXTROSE 2-3 GM-%(50ML) IV SOLR
INTRAVENOUS | Status: DC | PRN
  Administered 2024-04-03: 2 g via INTRAVENOUS

## 2024-04-03 MED ORDER — ACETAMINOPHEN 325 MG PO TABS
650.0000 mg | ORAL_TABLET | Freq: Four times a day (QID) | ORAL | Status: DC | PRN
  Administered 2024-04-05: 650 mg via ORAL
  Filled 2024-04-03 (×2): qty 2

## 2024-04-03 MED ORDER — SODIUM CHLORIDE 0.9 % IV SOLN: INTRAVENOUS | Status: DC

## 2024-04-03 MED ORDER — ATORVASTATIN CALCIUM 80 MG PO TABS
80.0000 mg | ORAL_TABLET | Freq: Every day | ORAL | Status: DC
  Administered 2024-04-04 – 2024-04-07 (×5): 80 mg via ORAL
  Filled 2024-04-03 (×5): qty 1

## 2024-04-03 MED ORDER — KETOROLAC TROMETHAMINE 15 MG/ML IJ SOLN
INTRAMUSCULAR | Status: AC
  Filled 2024-04-03: qty 1

## 2024-04-03 MED ORDER — SUCCINYLCHOLINE CHLORIDE 200 MG/10ML IV SOSY
PREFILLED_SYRINGE | INTRAVENOUS | Status: DC | PRN
  Administered 2024-04-03: 100 mg via INTRAVENOUS

## 2024-04-03 MED ORDER — SUCCINYLCHOLINE CHLORIDE 200 MG/10ML IV SOSY
PREFILLED_SYRINGE | INTRAVENOUS | Status: AC
  Filled 2024-04-03: qty 10

## 2024-04-03 MED ORDER — IRRISEPT - 450ML BOTTLE WITH 0.05% CHG IN STERILE WATER, USP 99.95% OPTIME
TOPICAL | Status: DC | PRN
  Administered 2024-04-03: 450 mL

## 2024-04-03 MED ORDER — LIDOCAINE HCL (PF) 2 % IJ SOLN
INTRAMUSCULAR | Status: AC
  Filled 2024-04-03: qty 5

## 2024-04-03 MED ORDER — PHENYLEPHRINE HCL (PRESSORS) 10 MG/ML IV SOLN
INTRAVENOUS | Status: AC
  Filled 2024-04-03: qty 1

## 2024-04-03 MED ORDER — BUPIVACAINE-EPINEPHRINE (PF) 0.5% -1:200000 IJ SOLN
INTRAMUSCULAR | Status: DC | PRN
  Administered 2024-04-03: 7 mL via PERINEURAL

## 2024-04-03 MED ORDER — GABAPENTIN 400 MG PO CAPS
400.0000 mg | ORAL_CAPSULE | Freq: Every day | ORAL | Status: DC
  Administered 2024-04-04 – 2024-04-08 (×5): 400 mg via ORAL
  Filled 2024-04-03 (×5): qty 1

## 2024-04-03 MED ORDER — DEXAMETHASONE SODIUM PHOSPHATE 10 MG/ML IJ SOLN
INTRAMUSCULAR | Status: AC
  Filled 2024-04-03: qty 1

## 2024-04-03 MED ORDER — MORPHINE SULFATE (PF) 4 MG/ML IV SOLN: 4.0000 mg | Freq: Once | INTRAVENOUS | Status: DC

## 2024-04-03 MED ORDER — PROPOFOL 10 MG/ML IV BOLUS
INTRAVENOUS | Status: AC
  Filled 2024-04-03: qty 20

## 2024-04-03 MED ORDER — FENTANYL CITRATE (PF) 100 MCG/2ML IJ SOLN
25.0000 ug | INTRAMUSCULAR | Status: DC | PRN
  Administered 2024-04-03 (×4): 50 ug via INTRAVENOUS

## 2024-04-03 MED ORDER — POLYETHYLENE GLYCOL 3350 17 G PO PACK: 17.0000 g | PACK | Freq: Every day | ORAL | Status: DC | PRN

## 2024-04-03 MED ORDER — DIAZEPAM 5 MG PO TABS
5.0000 mg | ORAL_TABLET | Freq: Once | ORAL | Status: AC
  Administered 2024-04-03: 5 mg via ORAL

## 2024-04-03 MED ORDER — HYDROMORPHONE HCL 1 MG/ML IJ SOLN
0.5000 mg | INTRAMUSCULAR | Status: DC | PRN
  Administered 2024-04-03 – 2024-04-06 (×10): 1 mg via INTRAVENOUS
  Filled 2024-04-03 (×12): qty 1

## 2024-04-03 MED ORDER — REMIFENTANIL HCL 1 MG IV SOLR
INTRAVENOUS | Status: AC
Start: 2024-04-03 — End: ?
  Filled 2024-04-03: qty 1000

## 2024-04-03 MED ORDER — FENTANYL CITRATE (PF) 100 MCG/2ML IJ SOLN
INTRAMUSCULAR | Status: AC
  Filled 2024-04-03: qty 2

## 2024-04-03 MED ORDER — BUPIVACAINE HCL (PF) 0.5 % IJ SOLN
INTRAMUSCULAR | Status: AC
  Filled 2024-04-03: qty 30

## 2024-04-03 MED ORDER — EPHEDRINE SULFATE-NACL 50-0.9 MG/10ML-% IV SOSY
PREFILLED_SYRINGE | INTRAVENOUS | Status: DC | PRN
  Administered 2024-04-03: 5 mg via INTRAVENOUS

## 2024-04-03 MED ORDER — DEXAMETHASONE SODIUM PHOSPHATE 10 MG/ML IJ SOLN
INTRAMUSCULAR | Status: DC | PRN
  Administered 2024-04-03: 8 mg via INTRAVENOUS

## 2024-04-03 MED ORDER — OXYCODONE HCL 5 MG PO TABS
5.0000 mg | ORAL_TABLET | Freq: Four times a day (QID) | ORAL | Status: DC | PRN
  Administered 2024-04-04 – 2024-04-08 (×12): 5 mg via ORAL
  Filled 2024-04-03 (×12): qty 1

## 2024-04-03 MED ORDER — EPHEDRINE 5 MG/ML INJ
INTRAVENOUS | Status: AC
  Filled 2024-04-03: qty 5

## 2024-04-03 MED ORDER — MIDAZOLAM HCL 2 MG/2ML IJ SOLN
INTRAMUSCULAR | Status: AC
  Filled 2024-04-03: qty 2

## 2024-04-03 MED ORDER — HYDRALAZINE HCL 20 MG/ML IJ SOLN
10.0000 mg | Freq: Once | INTRAMUSCULAR | Status: AC
  Administered 2024-04-03: 10 mg via INTRAVENOUS

## 2024-04-03 MED ORDER — SODIUM CHLORIDE 0.9% FLUSH
3.0000 mL | Freq: Two times a day (BID) | INTRAVENOUS | Status: DC
  Administered 2024-04-04 – 2024-04-08 (×9): 3 mL via INTRAVENOUS

## 2024-04-03 MED ORDER — ONDANSETRON HCL 4 MG PO TABS: 4.0000 mg | ORAL_TABLET | Freq: Four times a day (QID) | ORAL | Status: DC | PRN

## 2024-04-03 MED ORDER — PROPOFOL 10 MG/ML IV BOLUS
INTRAVENOUS | Status: DC | PRN
  Administered 2024-04-03: 160 mg via INTRAVENOUS

## 2024-04-03 MED ORDER — PHENYLEPHRINE 80 MCG/ML (10ML) SYRINGE FOR IV PUSH (FOR BLOOD PRESSURE SUPPORT)
PREFILLED_SYRINGE | INTRAVENOUS | Status: AC
Start: 2024-04-03 — End: ?
  Filled 2024-04-03: qty 10

## 2024-04-03 MED ORDER — LIDOCAINE HCL (CARDIAC) PF 100 MG/5ML IV SOSY
PREFILLED_SYRINGE | INTRAVENOUS | Status: DC | PRN
  Administered 2024-04-03: 100 mg via INTRAVENOUS

## 2024-04-03 MED ORDER — EPINEPHRINE PF 1 MG/ML IJ SOLN
INTRAMUSCULAR | Status: AC
  Filled 2024-04-03: qty 1

## 2024-04-03 MED ORDER — OXYCODONE HCL 5 MG PO TABS
ORAL_TABLET | ORAL | Status: AC
  Filled 2024-04-03: qty 1

## 2024-04-03 MED ORDER — KETOROLAC TROMETHAMINE 30 MG/ML IJ SOLN
INTRAMUSCULAR | Status: AC
  Filled 2024-04-03: qty 1

## 2024-04-03 MED ORDER — ACETAMINOPHEN 10 MG/ML IV SOLN
INTRAVENOUS | Status: AC
  Filled 2024-04-03: qty 100

## 2024-04-03 MED ORDER — OXYCODONE HCL 5 MG/5ML PO SOLN: 5.0000 mg | Freq: Once | ORAL | Status: AC | PRN

## 2024-04-03 MED ORDER — FENTANYL CITRATE (PF) 100 MCG/2ML IJ SOLN
INTRAMUSCULAR | Status: DC | PRN
  Administered 2024-04-03 (×2): 50 ug via INTRAVENOUS
  Administered 2024-04-03: 100 ug via INTRAVENOUS

## 2024-04-03 MED ORDER — HYDRALAZINE HCL 20 MG/ML IJ SOLN
INTRAMUSCULAR | Status: AC
  Filled 2024-04-03: qty 1

## 2024-04-03 MED ORDER — PHENYLEPHRINE HCL-NACL 20-0.9 MG/250ML-% IV SOLN
INTRAVENOUS | Status: DC | PRN
Start: 2024-04-03 — End: 2024-04-03
  Administered 2024-04-03: 30 ug/min via INTRAVENOUS

## 2024-04-03 MED ORDER — DIAZEPAM 5 MG PO TABS
ORAL_TABLET | ORAL | Status: AC
  Filled 2024-04-03: qty 1

## 2024-04-03 MED ORDER — ACETAMINOPHEN 650 MG RE SUPP: 650.0000 mg | Freq: Four times a day (QID) | RECTAL | Status: DC | PRN

## 2024-04-03 MED ORDER — REMIFENTANIL HCL 1 MG IV SOLR
INTRAVENOUS | Status: AC
  Filled 2024-04-03: qty 1000

## 2024-04-03 MED ORDER — METHOCARBAMOL 500 MG PO TABS
500.0000 mg | ORAL_TABLET | Freq: Four times a day (QID) | ORAL | Status: DC | PRN
  Administered 2024-04-03 – 2024-04-08 (×9): 500 mg via ORAL
  Filled 2024-04-03 (×8): qty 1

## 2024-04-03 SURGICAL SUPPLY — 43 items
BASIN KIT SINGLE STR (MISCELLANEOUS) ×1 IMPLANT
BUR NEURO DRILL SOFT 3.0X3.8M (BURR) ×1 IMPLANT
DERMABOND ADVANCED .7 DNX12 (GAUZE/BANDAGES/DRESSINGS) IMPLANT
DRAIN WOUND RND W/TROCAR (DRAIN) IMPLANT
DRAPE C ARM PK CFD 31 SPINE (DRAPES) ×1 IMPLANT
DRAPE LAPAROTOMY 77X122 PED (DRAPES) ×1 IMPLANT
DRAPE MICROSCOPE SPINE 48X150 (DRAPES) ×1 IMPLANT
DRSG TEGADERM 4X4.75 (GAUZE/BANDAGES/DRESSINGS) IMPLANT
DRSG TEGADERM 6X8 (GAUZE/BANDAGES/DRESSINGS) IMPLANT
ELECT REM PT RETURN 9FT ADLT (ELECTROSURGICAL) ×1 IMPLANT
ELECTRODE REM PT RTRN 9FT ADLT (ELECTROSURGICAL) ×1 IMPLANT
EVACUATOR SILICONE 100CC (DRAIN) IMPLANT
FEE INTRAOP CADWELL SUPPLY NCS (MISCELLANEOUS) IMPLANT
FEE INTRAOP MONITOR IMPULS NCS (MISCELLANEOUS) IMPLANT
GAUZE SPONGE 2X2 STRL 8-PLY (GAUZE/BANDAGES/DRESSINGS) IMPLANT
GLOVE SURG SYN 8.5 E (GLOVE) ×4 IMPLANT
GLOVE SURG SYN 8.5 PF PI (GLOVE) ×3 IMPLANT
GOWN SRG LRG LVL 4 IMPRV REINF (GOWNS) ×1 IMPLANT
GOWN SRG XL LVL 3 NONREINFORCE (GOWNS) ×1 IMPLANT
INTRAOP CADWELL SUPPLY FEE NCS (MISCELLANEOUS) ×1 IMPLANT
INTRAOP MONITOR FEE IMPULS NCS (MISCELLANEOUS) ×1 IMPLANT
JET LAVAGE IRRISEPT WOUND (IRRIGATION / IRRIGATOR) ×1 IMPLANT
KIT TURNOVER KIT A (KITS) ×1 IMPLANT
LAVAGE JET IRRISEPT WOUND (IRRIGATION / IRRIGATOR) IMPLANT
MANIFOLD NEPTUNE II (INSTRUMENTS) ×1 IMPLANT
NDL SAFETY ECLIPSE 18X1.5 (NEEDLE) ×1 IMPLANT
NS IRRIG 500ML POUR BTL (IV SOLUTION) ×1 IMPLANT
PACK LAMINECTOMY ARMC (PACKS) ×1 IMPLANT
PAD ARMBOARD POSITIONER FOAM (MISCELLANEOUS) ×2 IMPLANT
PIN CASPAR 14 (PIN) ×1 IMPLANT
PIN CASPAR 14MM (PIN) ×1 IMPLANT
PLATE ACP 1.6X38 2LVL (Plate) IMPLANT
SCREW ACP 3.5X17 S/D VARIA (Screw) IMPLANT
SPACER CERVICAL FRGE 12X14X7-7 (Spacer) IMPLANT
SPONGE KITTNER 5P (MISCELLANEOUS) ×1 IMPLANT
SURGIFLO W/THROMBIN 8M KIT (HEMOSTASIS) ×1 IMPLANT
SUT ETHILON 3-0 FS-10 30 BLK (SUTURE) ×1 IMPLANT
SUT STRATA 3-0 15 PS-2 (SUTURE) ×1 IMPLANT
SUT VIC AB 3-0 SH 8-18 (SUTURE) ×1 IMPLANT
SUTURE EHLN 3-0 FS-10 30 BLK (SUTURE) IMPLANT
SYR 20ML LL LF (SYRINGE) ×1 IMPLANT
TAPE CLOTH 3X10 WHT NS LF (GAUZE/BANDAGES/DRESSINGS) ×2 IMPLANT
TRAP FLUID SMOKE EVACUATOR (MISCELLANEOUS) ×1 IMPLANT

## 2024-04-03 NOTE — Assessment & Plan Note (Signed)
-   Resume home antihypertensives tomorrow 

## 2024-04-03 NOTE — H&P (View-Only) (Signed)
 Consult requested by:  Dr. Fanny Bien   Consult requested for:  Weakness, inability to walk, cervical stenosis  Primary Physician:  [facility]  History of Present Illness: 04/03/2024 Mr. Eugune Sine is here today with a chief complaint of inability to walk.     Over the past 2 to 4 weeks, he has begun having weakness in his left leg that has been worsening to the point where he has not been able to walk over the past few days.  He has had profound weakness in his left leg for the past 2 to 4 weeks.  He has difficulty using his left hand and has lost substantial dexterity in both of his hands.  He cannot feel either hand currently.  He does not have the strength to stand himself back up when he sits down.  He has substantial spasms in his legs currently.  His balance has been severely impacted by the symptoms.  His wife notes that he has been walking abnormally over the past several months and that his symptoms have been worsening dramatically.  His symptoms have been dramatically worsening to the point where he is no longer able to walk.  He can no longer move himself in bed due to his left arm and leg weakness.  Bowel/Bladder Dysfunction: none   Past Surgery: none  Quest Driggers has clear and progressive symptoms of cervical myelopathy.  The symptoms are causing a significant impact on the patient's life.   I have utilized the care everywhere function in epic to review the outside records available from external health systems.  Review of Systems:  A 10 point review of systems is negative, except for the pertinent positives and negatives detailed in the HPI.  Past Medical History: Past Medical History:  Diagnosis Date   High cholesterol    Hypertension     Past Surgical History: History reviewed. No pertinent surgical history.  Allergies: Allergies as of 04/03/2024   (No Known Allergies)    Medications: No outpatient medications have been marked  as taking for the 04/03/24 encounter [facility] Encounter).    Social History: Social History   Tobacco Use   Smoking status: Never   Smokeless tobacco: Never  Vaping Use   Vaping status: Never Used  Substance Use Topics   Alcohol use: Never   Drug use: Never    Family Medical History: History reviewed. No pertinent family history.  Physical Examination: Vitals:   04/03/24 1053  BP: (!) 192/82  Pulse: (!) 57  Resp: 16  Temp: 98.4 F (36.9 C)  SpO2: 99%    General: Patient is in no apparent distress. Attention to examination is appropriate.  Neck:   Limited range of motion.  Respiratory: Patient is breathing without any difficulty.   NEUROLOGICAL:     Awake, alert, oriented to person, place, and time.  Speech is clear and fluent.  Cranial Nerves: Pupils equal round and reactive to light.  Facial tone is symmetric.  Facial sensation is symmetric. Shoulder shrug is symmetric. Tongue protrusion is midline.  L pronator drift  Strength: Side Biceps Triceps Deltoid Interossei Grip Wrist Ext. Wrist Flex.  R 4+ 4+ 4+ 4 4 4+ 4+  L 4- 4 3 3 2 3 3    Side Iliopsoas Quads Hamstring PF DF EHL  R 5 5 5 5 5 5   L 4 4+ 4 4+ 5 5   Reflexes are 3+ and symmetric at the biceps, triceps, brachioradialis, patella and achilles.  Hoffman's is present bilaterally.   Bilateral upper and lower extremity sensation shows diminished light touch sensation with worse feeling on L than R throughout.  Has significant coordination deficits bilaterally, worse on left.  Gait is untested.  He cannot walk.   Medical Decision Making  Imaging: MRI C spine 04/03/2024 IMPRESSION: Degenerative changes of the cervical spine as above. Severe spinal canal stenosis at C3-4 and C4-5 with associated cord compression. Cord signal abnormality at C3-4 and C4-5 along with cord volume loss suggestive of myelomalacia.   Multilevel foraminal stenosis, greatest and severe bilaterally at C3-4, on the right  at C4-5, and bilaterally at C5-6. Additional moderate to severe foraminal stenosis on the left at C4-5.   Mild prevertebral edema from C2-C4 is likely related to prominent anterior endplate osteophytes.   Mild discogenic edema at C3-4 which may contribute to neck pain.     Electronically Signed   By: Emily Filbert M.D.   On: 04/03/2024 15:13  I have personally reviewed the images and agree with the above interpretation.  Assessment and Plan: Mr. Sebesta is a pleasant 63 y.o. 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  He has severe weakness and has had rapidly worsening condition having lost the ability to walk over the past couple of days.  He is rapidly losing neurologic function.  I have recommended emergency surgical intervention to try to prevent any further neurologic deficits.  Any further delay would not be ethical in my professional opinion.  I have recommended surgical intervention with a C3-5 anterior cervical discectomy and fusion.  I discussed the planned procedure at length with the patient, including the risks, benefits, alternatives, and indications. The risks discussed include but are not limited to bleeding, infection, need for reoperation, spinal fluid leak, stroke, vision loss, anesthetic complication, coma, paralysis, and even death. We also discussed the possibility of post-operative dysphagia, vocal cord paralysis, and the risk of adjacent segment disease in the future. I also described in detail that improvement was not guaranteed.  The patient expressed understanding of these risks, and asked that we proceed with surgery. I described the surgery in layman's terms, and gave ample opportunity for questions, which were answered to the best of my ability.     I have communicated my recommendations to the requesting physician and coordinated care to facilitate these  recommendations.     Aryan Bello K. Myer Haff MD, [facility] Neurosurgery

## 2024-04-03 NOTE — Anesthesia Postprocedure Evaluation (Signed)
 Anesthesia Post Note  Patient: Nathaniel Hobbs  Procedure(s) Performed: ANTERIOR CERVICAL DECOMPRESSION/DISCECTOMY FUSION 2 LEVELS  Patient location during evaluation: PACU Anesthesia Type: General Level of consciousness: awake and alert Pain management: pain level controlled Vital Signs Assessment: post-procedure vital signs reviewed and stable Respiratory status: spontaneous breathing, nonlabored ventilation, respiratory function stable and patient connected to nasal cannula oxygen Cardiovascular status: blood pressure returned to baseline and stable Postop Assessment: no apparent nausea or vomiting Anesthetic complications: no   No notable events documented.   Last Vitals:  Vitals:   04/03/24 2325 04/03/24 2330  BP: (!) 149/89   Pulse: 80 83  Resp: 14 16  Temp: (!) 36.2 C   SpO2: 95% 95%    Last Pain:  Vitals:   04/03/24 2325  TempSrc:   PainSc: 5                  Cleda Mccreedy Molleigh Huot

## 2024-04-03 NOTE — Assessment & Plan Note (Addendum)
 Patient presented with progressive bilateral upper extremity paresthesia and left-sided weakness with evidence of severe spinal stenosis with cord compression.  Neurosurgery took the patient urgently for cervical discectomy and fusion at C3/4 and C4/5 on the evening of 4/10.   Continue working with PT and OT.  Patient doing better with moving around and with coordination than when he came in.  Advised I increased oral oxycodone to 10mg  and can also take robaxin for spasm.  Patient to go to acute inpatient rehab today.

## 2024-04-03 NOTE — Anesthesia Preprocedure Evaluation (Signed)
 Anesthesia Evaluation  Patient identified by MRN, date of birth, ID band Patient awake    Reviewed: Allergy & Precautions, NPO status , Patient's Chart, lab work & pertinent test results  History of Anesthesia Complications Negative for: history of anesthetic complications  Airway Mallampati: III  TM Distance: >3 FB Neck ROM: full    Dental  (+) Chipped   Pulmonary neg pulmonary ROS, neg shortness of breath   Pulmonary exam normal        Cardiovascular Exercise Tolerance: Good hypertension, (-) angina + CAD, + Past MI and + Cardiac Stents  (-) DOE Normal cardiovascular exam     Neuro/Psych negative neurological ROS  negative psych ROS   GI/Hepatic negative GI ROS, Neg liver ROS,neg GERD  ,,  Endo/Other  negative endocrine ROS    Renal/GU      Musculoskeletal   Abdominal   Peds  Hematology negative hematology ROS (+)   Anesthesia Other Findings Past Medical History: No date: High cholesterol No date: Hypertension  History reviewed. No pertinent surgical history.  BMI    Body Mass Index: 32.34 kg/m      Reproductive/Obstetrics negative OB ROS                             Anesthesia Physical Anesthesia Plan  ASA: 3 and emergent  Anesthesia Plan: General ETT   Post-op Pain Management:    Induction: Intravenous  PONV Risk Score and Plan: Ondansetron, Dexamethasone, Midazolam and Treatment may vary due to age or medical condition  Airway Management Planned: Oral ETT and Video Laryngoscope Planned  Additional Equipment:   Intra-op Plan:   Post-operative Plan: Extubation in OR  Informed Consent: I have reviewed the patients History and Physical, chart, labs and discussed the procedure including the risks, benefits and alternatives for the proposed anesthesia with the patient or authorized representative who has indicated his/her understanding and acceptance.     Dental  Advisory Given  Plan Discussed with: Anesthesiologist, CRNA and Surgeon  Anesthesia Plan Comments: (Patient consented for risks of anesthesia including but not limited to:  - adverse reactions to medications - damage to eyes, teeth, lips or other oral mucosa - nerve damage due to positioning  - sore throat or hoarseness - Damage to heart, brain, nerves, lungs, other parts of body or loss of life  Patient voiced understanding and assent.)       Anesthesia Quick Evaluation

## 2024-04-03 NOTE — Anesthesia Procedure Notes (Signed)
 Procedure Name: Intubation Date/Time: 04/03/2024 6:59 PM  Performed by: Clydia Llano, CRNAPre-anesthesia Checklist: Patient identified, Patient being monitored, Timeout performed, Emergency Drugs available and Suction available Patient Re-evaluated:Patient Re-evaluated prior to induction Oxygen Delivery Method: Circle system utilized Preoxygenation: Pre-oxygenation with 100% oxygen Induction Type: IV induction Ventilation: Mask ventilation without difficulty Laryngoscope Size: 4 and McGrath Grade View: Grade I Tube type: Oral Tube size: 7.0 mm Number of attempts: 1 Airway Equipment and Method: Stylet Placement Confirmation: ETT inserted through vocal cords under direct vision, positive ETCO2 and breath sounds checked- equal and bilateral Secured at: 22 cm Tube secured with: Tape Dental Injury: Teeth and Oropharynx as per pre-operative assessment

## 2024-04-03 NOTE — ED Notes (Signed)
 NEUROSURGERY AT THE BEDSIDE

## 2024-04-03 NOTE — Interval H&P Note (Signed)
 History and Physical Interval Note:  04/03/2024 5:57 PM  [name]  has presented today for surgery, with the diagnosis of cervical myelopathy, weakness.  The various methods of treatment have been discussed with the patient and family. After consideration of risks, benefits and other options for treatment, the patient has consented to  Procedure(s): ANTERIOR CERVICAL DECOMPRESSION/DISCECTOMY FUSION 2 LEVELS (N/A) as a surgical intervention.  The patient's history has been reviewed, patient examined, no change in status, stable for surgery.  I have reviewed the patient's chart and labs.  Questions were answered to the patient's satisfaction.    Heart sounds normal no MRG. Chest Clear to Auscultation Bilaterally.   Nathaniel Hobbs

## 2024-04-03 NOTE — Transfer of Care (Signed)
 Immediate Anesthesia Transfer of Care Note  Patient: Asir Muro  Procedure(s) Performed: ANTERIOR CERVICAL DECOMPRESSION/DISCECTOMY FUSION 2 LEVELS  Patient Location: PACU  Anesthesia Type:General  Level of Consciousness: awake, alert , and oriented  Airway & Oxygen Therapy: Patient Spontanous Breathing  Post-op Assessment: Report given to RN and Post -op Vital signs reviewed and stable  Post vital signs: Reviewed and stable  Last Vitals:  Vitals Value Taken Time  BP 208/106 04/03/24 2220  Temp    Pulse 77 04/03/24 2220  Resp 15 04/03/24 2220  SpO2 90 % 04/03/24 2220  Vitals shown include unfiled device data.  Last Pain:  Vitals:   04/03/24 2220  TempSrc:   PainSc: 6          Complications: No notable events documented.

## 2024-04-03 NOTE — H&P (Signed)
 History and Physical    Patient: Nathaniel Hobbs YNW:[id number] DOB: September 09, 1961 DOA: 04/03/2024 DOS: the patient was seen and examined on 04/03/2024 PCP: [facility]  Patient coming from: Home  Chief Complaint:  Chief Complaint  Patient presents with   Back Pain   Groin Pain   HPI: Nathaniel Hobbs is a 63 y.o. male with medical history significant of Hypertension, hyperlipidemid, CAD s/p PCI (2009, 2013) with DES to mLAD (2013), prediabetes, who presents to the ED due to back pain.  Nathaniel Hobbs states that he has been experiencing back pain with difficulty getting up from a kneeling position for several months now, however over the last few weeks, he has noticed a significant worsening.  Then today, he knelt down onto the ground and was unable to stand up on his own and required 3 people to help him up.  During this time, he has noticed worsening paresthesia of his bilateral upper extremities.  He has noticed significant weakness of his left arm and left leg.  This is led to him having difficulty walking.  Nathaniel Hobbs states that otherwise, he feels at his baseline.  He does not experience any shortness of breath or chest pain.  He denies any recent illnesses including cough, rhinorrhea, nausea, vomiting, diarrhea, abdominal pain.  ED course: On arrival to the ED, patient was hypertensive at 192/82 with heart rate of 57.  He was saturating at 99% on room air.  She was afebrile at 98.4.  Initial workup notable for unremarkable CBC and CMP.  MRI of the brain with no acute intracranial abnormality.  MRI of the C-spine notable for degenerative changes with severe spinal stenosis with associated cord compression.  MRI of the L-spine with degenerative changes in addition to severe effacement of the thecal sac.  Neurosurgery was consulted with plan for the OR tonight.  [facility] contacted for admission.   Review of Systems: As mentioned in the history of present illness. All other systems reviewed  and are negative.  Past Medical History:  Diagnosis Date   High cholesterol    Hypertension    History reviewed. No pertinent surgical history. Social History:  reports that he has never smoked. He has never used smokeless tobacco. He reports that he does not drink alcohol and does not use drugs.  No Known Allergies  History reviewed. No pertinent family history.  Prior to Admission medications   Medication Sig Start Date End Date Taking? Authorizing Provider  atorvastatin (LIPITOR) 80 MG tablet Take 80 mg by mouth daily.   Yes [provider]  celecoxib (CELEBREX) 200 MG capsule Take 200 mg by mouth 2 (two) times daily.   Yes [provider]  cyclobenzaprine (FLEXERIL) 10 MG tablet Take 10 mg by mouth 2 (two) times daily as needed. 01/22/24  Yes [provider]  enalapril (VASOTEC) 10 MG tablet Take 1 tablet by mouth 2 (two) times daily. 03/02/24 03/02/25 Yes [provider]  gabapentin (NEURONTIN) 400 MG capsule Take 400 mg by mouth daily. 03/07/24 03/07/25 Yes [provider]  metaxalone (SKELAXIN) 800 MG tablet Take 800 mg by mouth 3 (three) times daily. 03/07/24 06/05/24 Yes [provider]  nabumetone (RELAFEN) 500 MG tablet Take 500 mg by mouth 2 (two) times daily. 02/29/24  Yes [provider]    Physical Exam: Vitals:   04/03/24 1053 04/03/24 1054 04/03/24 1812  BP: (!) 192/82  (!) 173/99  Pulse: (!) 57  (!) 55  Resp: 16  16  Temp: 98.4 F (36.9 C)  97.7 F (36.5 C)  TempSrc: Oral  Temporal  SpO2: 99%  99%  Weight:  99.3 kg   Height:  5\' 9"  (1.753 m)    Physical Exam Vitals and nursing note reviewed.  Constitutional:      General: He is in acute distress (2/2 pain).     Appearance: He is obese. He is not toxic-appearing.  HENT:     Head: Normocephalic and atraumatic.     Mouth/Throat:     Mouth: Mucous membranes are moist.     Pharynx: Oropharynx is clear.  Eyes:     Conjunctiva/sclera: Conjunctivae normal.      Pupils: Pupils are equal, round, and reactive to light.  Cardiovascular:     Rate and Rhythm: Normal rate and regular rhythm.     Heart sounds: No murmur heard.    No gallop.  Pulmonary:     Effort: Pulmonary effort is normal. No respiratory distress.     Breath sounds: Normal breath sounds. No wheezing or rales.  Abdominal:     General: Bowel sounds are normal. There is no distension.     Palpations: Abdomen is soft.     Tenderness: There is no abdominal tenderness. There is no guarding.  Musculoskeletal:     Right lower leg: No edema.     Left lower leg: No edema.  Skin:    General: Skin is warm and dry.  Neurological:     Mental Status: He is alert and oriented to person, place, and time.  Psychiatric:        Mood and Affect: Mood normal.        Behavior: Behavior normal.    Data Reviewed: CBC with WBC of 7.1, hemoglobin of 15.1, and platelets of 216 CMP with sodium of 139, potassium 3.9, bicarb 24, glucose 108, BUN 11, creatinine 0.82, AST 21, ALT 16, GFR above 60  MR LUMBAR SPINE WO CONTRAST Result Date: 04/03/2024 CLINICAL DATA:  Lower back pain, fall in November 2024, weakness of left leg. EXAM: MRI LUMBAR SPINE WITHOUT CONTRAST TECHNIQUE: Multiplanar, multisequence MR imaging of the lumbar spine was performed. No intravenous contrast was administered. COMPARISON:  Same day MRI cervical spine and MRI head. FINDINGS: Segmentation:  Standard. Alignment: Lumbar lordosis is maintained. No significant listhesis. Vertebrae: Slight irregularity of the L2 superior endplate with discogenic edema along the right aspect of the superior endplate without significant height loss, most likely related to chronic degenerative changes. Schmorl's nodes at multiple levels. No suspicious osseous lesion. Conus medullaris and cauda equina: Conus extends to the L1 level. Conus and cauda equina appear normal. Paraspinal and other soft tissues: Mild atrophy of the paraspinal musculature. The  paraspinal soft tissues are otherwise unremarkable. Disc levels: T12-L1: Minimal disc bulge. Left foraminal disc protrusion. Mild-to-moderate facet arthrosis. No significant spinal canal stenosis. No significant foraminal stenosis. L1-2: Diffuse disc bulge which indents the ventral thecal sac. Moderate facet arthrosis. There is additional mild prominence of the dorsal epidural fat. Associated mild-to-moderate spinal canal stenosis with slight crowding of the cauda equina nerve roots. No significant foraminal stenosis. L2-3: Diffuse disc bulge which indents the ventral thecal sac. Moderate facet arthrosis. Mild prominence of the dorsal epidural fat. There is severe effacement of the thecal sac with crowding of the cauda equina nerve roots and complete effacement of CSF at this level within the thecal sac. Mild bilateral foraminal stenosis. L3-4: Diffuse disc bulge which indents the ventral thecal sac. Prominence of epidural fat  surrounding the thecal sac. Moderate facet arthrosis. Severe effacement of the thecal sac at this level with complete effacement of CSF. Moderate right and mild left foraminal stenosis. L4-5: Diffuse disc bulge. Moderate facet arthrosis. Prominence of the epidural fat. Severe effacement of the thecal sac with crowding of the cauda equina nerve roots and complete effacement of CSF. Moderate to severe right and mild left foraminal stenosis. Disc bulge and facet osteophytes likely contact the exiting right L4 nerve root. L5-S1: Diffuse disc bulge. Central annular fissure. Prominence of the epidural fat. Mild to moderate effacement of the thecal sac. Moderate facet arthrosis. Mild foraminal stenosis on the right. IMPRESSION: Mild irregularity of the L2 superior endplate with adjacent edema likely reflecting discogenic edema in the setting of degenerative changes. No height loss. Recommend correlation with history of trauma and tenderness at this level. Degenerative changes of the lumbar spine as  above along with prominence of the epidural fat at multiple levels. There is severe effacement of the thecal sac at L2-3 through L4-5 as above. Multilevel foraminal stenosis, greatest and moderate to severe on the right at L4-5. Disc bulge and facet osteophytes at L4-5 contact the exiting right L4 nerve root. Small central annular fissure at L5-S1. Electronically Signed   By: Emily Filbert M.D.   On: 04/03/2024 15:22   MR Cervical Spine Wo Contrast Result Date: 04/03/2024 CLINICAL DATA:  Cervical radiculopathy, fall in November 2024, weakness. EXAM: MRI CERVICAL SPINE WITHOUT CONTRAST TECHNIQUE: Multiplanar, multisequence MR imaging of the cervical spine was performed. No intravenous contrast was administered. COMPARISON:  Same day MRI head. FINDINGS: Alignment: Cervical lordosis is maintained. Trace retrolisthesis of C4 on C5. Vertebrae: Degenerative endplate changes at multiple levels. There is mild discogenic edema at C3-4. Mild discogenic height loss of the C4 and C5 vertebral bodies. No evidence of acute fracture. Cord: There is cord compression at the C3-4 and C4-5 levels with diminished caliber of the cord also noted at the C5-6 level. Increased signal within the cervical cord at C3-4 and C4-5 most likely reflecting myelomalacia. Hemangioma in the T2 vertebral body. Posterior Fossa, vertebral arteries, paraspinal tissues: The visualized posterior fossa structures are unremarkable. See same day MRI brain for complete evaluation intracranial findings. Prevertebral edema from C2-C4 likely related to prominent anterior endplate osteophytes. No significant edema within the paraspinal musculature posteriorly. No evidence of ligamentous injury. Disc levels: C2-3: Disc osteophyte complex slightly eccentric to the left which indents the ventral thecal sac without contacting the spinal cord. Uncovertebral hypertrophy more pronounced on the left. Bilateral facet arthrosis. Mild right and moderate left foraminal  stenosis. C3-4: Mild disc height loss. Large disc osteophyte complex which compresses the ventral cervical cord. Mild thickening of the ligamentum flavum which indents the dorsal thecal sac. Severe spinal canal stenosis and cord compression with cord signal abnormality at this level. Bilateral facet arthrosis and uncovertebral hypertrophy. Severe bilateral foraminal stenosis. C4-5: Mild disc height loss. Large disc osteophyte complex eccentric to the right which compresses the ventral cervical cord. Thickening of the ligamentum flavum which indents the dorsal thecal sac. Severe spinal canal stenosis and cord compression with cord signal abnormality at this level. Uncovertebral hypertrophy more pronounced on the right. Bilateral facet arthrosis. Severe right and moderate to severe left foraminal stenosis. C5-6: Disc osteophyte complex and right subarticular disc protrusion which indents the ventral thecal sac. There is possible subtle flattening of the ventral cervical cord. Decreased cord caliber at this level with subtle cord signal abnormality without definite cord compression. Uncovertebral  hypertrophy and facet arthrosis. Severe bilateral foraminal stenosis. C6-7: Disc osteophyte complex and left paracentral disc protrusion which indents the ventral thecal sac without contacting the spinal cord. Bilateral facet arthrosis. Uncovertebral hypertrophy more pronounced on the left. Mild-to-moderate right and moderate left foraminal stenosis. C7-T1: Small central disc protrusion. No significant spinal canal stenosis. Bilateral facet arthrosis. No significant foraminal stenosis. IMPRESSION: Degenerative changes of the cervical spine as above. Severe spinal canal stenosis at C3-4 and C4-5 with associated cord compression. Cord signal abnormality at C3-4 and C4-5 along with cord volume loss suggestive of myelomalacia. Multilevel foraminal stenosis, greatest and severe bilaterally at C3-4, on the right at C4-5, and  bilaterally at C5-6. Additional moderate to severe foraminal stenosis on the left at C4-5. Mild prevertebral edema from C2-C4 is likely related to prominent anterior endplate osteophytes. Mild discogenic edema at C3-4 which may contribute to neck pain. Electronically Signed   By: Emily Filbert M.D.   On: 04/03/2024 15:13   MR BRAIN WO CONTRAST Result Date: 04/03/2024 CLINICAL DATA:  Paresthesias, numbness or tingling for 1 month in the left leg and associated weakness. Carpal tunnel like symptoms of both upper extremities. EXAM: MRI HEAD WITHOUT CONTRAST TECHNIQUE: Multiplanar, multiecho pulse sequences of the brain and surrounding structures were obtained without intravenous contrast. COMPARISON:  None Available. FINDINGS: Brain: No acute infarct. No evidence of intracranial hemorrhage. Nonspecific scattered T2/FLAIR hyperintensity in the periventricular and subcortical white matter. Possible small remote infarct in the left cerebellum. No mass lesion or midline shift. Normal appearance of midline structures. The basilar cisterns are patent. No extra-axial fluid collections. Ventricles: Normal size and configuration of the ventricles. Vascular: Skull base flow voids are visualized. 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 Skull and upper cervical spine: Degenerative changes in the visualized upper cervical spine. There is suggestion of a large disc bulge at C3-4 better evaluated on same day MRI cervical spine. Visualized calvarium is unremarkable. Sinuses/Orbits: Orbits are symmetric. Paranasal sinuses are clear. Other: Mastoid air cells are clear. IMPRESSION: No acute intracranial abnormality. Nonspecific white matter signal abnormality which may reflect mild chronic microvascular ischemic changes. Additional considerations include sequelae of migraine headaches, prior  infection/inflammatory process, or demyelination. 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 Electronically Signed   By: Emily Filbert M.D.   On: 04/03/2024 15:00   There are no new results to review at this time.  Assessment and Plan:  * Cervical myelopathy (HCC) Patient is presenting with progressive bilateral upper extremity paresthesia and left-sided weakness with evidence of severe spinal stenosis with cord compression.  Neurosurgery has evaluated and will take emergently to the OR this evening.  - Neurosurgery consulted; appreciate their recommendations - N.p.o. - Pain control with Tylenol, oxycodone and Dilaudid - PT/OT postop per neurosurgery  Benign essential hypertension - Resume home antihypertensives tomorrow  CAD (coronary artery disease) Patient has a distant history of obstructive CAD with PCI in both 2008 and 2013.  He had a DES placed in the mid LAD in 2013.  Since then, he states he has been chest pain-free both with exertion and at rest.  - Resume home aspirin postoperatively once cleared by neurosurgery - Continue home statin  Advance Care Planning:   Code Status: Full Code   Consults: Neurosurgery  Family Communication: Patient's wife and daughter updated at bedside  Severity of Illness: The appropriate patient status for this patient is INPATIENT. Inpatient status is judged to be reasonable and necessary in order to provide the required intensity of service to ensure the patient's safety. The patient's presenting symptoms, physical exam findings, and initial radiographic and laboratory data in the context of their chronic comorbidities is felt to place them at high risk for further clinical deterioration. Furthermore, it is not anticipated that the patient will be medically stable for discharge from the hospital within 2 midnights  of admission.   * I certify that at the point of admission it is my clinical judgment that the patient will require inpatient hospital care spanning beyond 2 midnights from the point of admission due to high intensity of service, high risk for further deterioration and high frequency of surveillance required.*  Author: Verdene Lennert, MD 04/03/2024 7:02 PM  For on call review [email or web address].

## 2024-04-03 NOTE — ED Triage Notes (Signed)
 PAtient fell around Thanksgiving 2024 onto bricks at his house outside/on landscaping. About two weeks after that he was reaching down to get something and legs got very weak and gave out. EMS helped patient get up and then was not seen.  Another episode of legs collapsing happened a few weeks later.  Today patient went to kneel down on one knee to get something he dropped in the floor and could not get back up. EMS was called and it took three assist to help patient get around due to weakness in legs. Reports a dull pain in lower back that has been present for years. States he does have a sharp pain in right groin area when trying to lift leg.    Also reports he has carpal tunnel and numbness in bilateral arms that has been ongoing and has current referral

## 2024-04-03 NOTE — Assessment & Plan Note (Addendum)
 Patient has a distant history of obstructive CAD with PCI in both 2008 and 2013.  He had a DES placed in the mid LAD in 2013.  Continue home statin, restart aspirin tomorrow

## 2024-04-03 NOTE — Op Note (Signed)
 Indications: [name] is a 64 y.o. 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  He presented with rapidly progressive myelopathy with loss of the ability to walk.  Due to his worsening neurologic deficits and profound weakness, urgent surgery was recommended.  Findings: stenosis, successful decompression  Preoperative Diagnosis: Cervical myelopathy, cervical stenosis, myelomalacia, weakness Postoperative Diagnosis: same   EBL: 100 ml IVF: see AR Drains: one Disposition: Extubated and Stable to PACU Complications: none  No foley catheter was placed.   Preoperative Note:    Risks of surgery discussed include: infection, bleeding, stroke, coma, death, paralysis, CSF leak, nerve/spinal cord injury, numbness, tingling, weakness, complex regional pain syndrome, recurrent stenosis and/or disc herniation, vascular injury, development of instability, neck/back pain, need for further surgery, persistent symptoms, development of deformity, and the risks of anesthesia. The patient understood these risks and agreed to proceed.   Procedure:  1) Anterior cervical diskectomy and fusion at C3/4 and C4/5 2) Anterior cervical instrumentation at C3 - 5 using Nuvasive ACP 3) Placement of structural allograft ([place] [place]) 4) Use of operative microscope 5) Use of flouroscopy   Procedure: After obtaining informed consent, the patient taken to the operating room, placed in supine position, general anesthesia induced.  The patient had a small shoulder roll placed behind their shoulders.  The patient received preop antibiotics and IV Decadron.  A timeout was performed. The patient had a neck incision outlined, was prepped and draped in usual sterile fashion. The incision was injected with local anesthetic.   An incision was opened, dissection taken down medial to the carotid artery and jugular vein, lateral to the trachea and esophagus.  The prevertebral fascia  identified and a localizing x-ray demonstrated the correct level.  The longus colli were dissected laterally, and self-retaining retractors placed to open the operative field. The microscope was then brought into the field.  With this complete, distractor pins were placed in the vertebral bodies of C3 and C5. The distractor was placed.  Very large anterior osteophytes were noted.  These were carefully drilled while protecting the soft tissues.  The annuli at C3-4 and C4-5 were then opened. Curettes and pituitary rongeurs used to remove the majority of disk, then the drill was used to remove the posterior osteophyte and begin the foraminotomies. The nerve hook was used to elevate the posterior longitudinal ligament, which was then removed with Kerrison rongeurs. The microblunt nerve hook could be passed out the foramina bilaterally at each level.   Meticulous hemostasis was obtained.  Structural allograft was tapped behind the anterior lip of the vertebral body at C3/4 (7 mm) and C4/5 (7 mm).    Please note that the procedure included removal of the disc, removal of the posterior osteophytes, and removal of the posterior longitudinal ligament to ensure decompression of the spinal cord.  Additionally, foraminotomies were performed on both sides of the spinal canal to decompress the nerve roots. This was performed at each level.  The caspar distractor was removed, and bone wax used for hemostasis. A separate, 3 segment Nuvasive ACP plate was chosen.  Two screws placed in each vertebral body, respectively making sure the screws were behind the locking mechanism.  Final AP and lateral radiographs were taken.   Please note that the plate is not inclusive to the interbody structural allograft.  The anchoring mechanism of the plate is completely separate from the allograft.  A drain was placed.  With everything in good position, the wound was irrigated copiously  and meticulous hemostasis obtained.  Wound was  closed in 2 layers using interrupted inverted 3-0 Vicryl sutures.  The wound was dressed with dermabond, the head of bed at 30 degrees, taken to recovery room in stable condition.  No new postop neurological deficits were identified.  Sponge and pattie counts were correct at the end of the procedure.   Monitoring was stable throughout.   I performed the entire procedure.  Venetia Night MD

## 2024-04-03 NOTE — Consult Note (Addendum)
 Consult requested by:  Dr. Fanny Bien   Consult requested for:  Weakness, inability to walk, cervical stenosis  Primary Physician:  [facility]  History of Present Illness: 04/03/2024 [name] is here today with a chief complaint of inability to walk.     Over the past 2 to 4 weeks, he has begun having weakness in his left leg that has been worsening to the point where he has not been able to walk over the past few days.  He has had profound weakness in his left leg for the past 2 to 4 weeks.  He has difficulty using his left hand and has lost substantial dexterity in both of his hands.  He cannot feel either hand currently.  He does not have the strength to stand himself back up when he sits down.  He has substantial spasms in his legs currently.  His balance has been severely impacted by the symptoms.  His wife notes that he has been walking abnormally over the past several months and that his symptoms have been worsening dramatically.  His symptoms have been dramatically worsening to the point where he is no longer able to walk.  He can no longer move himself in bed due to his left arm and leg weakness.  Bowel/Bladder Dysfunction: none   Past Surgery: none  Nathaniel Hobbs has clear and progressive symptoms of cervical myelopathy.  The symptoms are causing a significant impact on the patient's life.   I have utilized the care everywhere function in epic to review the outside records available from external health systems.  Review of Systems:  A 10 point review of systems is negative, except for the pertinent positives and negatives detailed in the HPI.  Past Medical History: Past Medical History:  Diagnosis Date   High cholesterol    Hypertension     Past Surgical History: History reviewed. No pertinent surgical history.  Allergies: Allergies as of 04/03/2024   (No Known Allergies)    Medications: No outpatient medications have been marked  as taking for the 04/03/24 encounter [facility] Encounter).    Social History: Social History   Tobacco Use   Smoking status: Never   Smokeless tobacco: Never  Vaping Use   Vaping status: Never Used  Substance Use Topics   Alcohol use: Never   Drug use: Never    Family Medical History: History reviewed. No pertinent family history.  Physical Examination: Vitals:   04/03/24 1053  BP: (!) 192/82  Pulse: (!) 57  Resp: 16  Temp: 98.4 F (36.9 C)  SpO2: 99%    General: Patient is in no apparent distress. Attention to examination is appropriate.  Neck:   Limited range of motion.  Respiratory: Patient is breathing without any difficulty.   NEUROLOGICAL:     Awake, alert, oriented to person, place, and time.  Speech is clear and fluent.  Cranial Nerves: Pupils equal round and reactive to light.  Facial tone is symmetric.  Facial sensation is symmetric. Shoulder shrug is symmetric. Tongue protrusion is midline.  L pronator drift  Strength: Side Biceps Triceps Deltoid Interossei Grip Wrist Ext. Wrist Flex.  R 4+ 4+ 4+ 4 4 4+ 4+  L 4- 4 3 3 2 3 3    Side Iliopsoas Quads Hamstring PF DF EHL  R 5 5 5 5 5 5   L 4 4+ 4 4+ 5 5   Reflexes are 3+ and symmetric at the biceps, triceps, brachioradialis, patella and achilles.  Hoffman's is present bilaterally.   Bilateral upper and lower extremity sensation shows diminished light touch sensation with worse feeling on L than R throughout.  Has significant coordination deficits bilaterally, worse on left.  Gait is untested.  He cannot walk.   Medical Decision Making  Imaging: MRI C spine 04/03/2024 IMPRESSION: Degenerative changes of the cervical spine as above. Severe spinal canal stenosis at C3-4 and C4-5 with associated cord compression. Cord signal abnormality at C3-4 and C4-5 along with cord volume loss suggestive of myelomalacia.   Multilevel foraminal stenosis, greatest and severe bilaterally at C3-4, on the right  at C4-5, and bilaterally at C5-6. Additional moderate to severe foraminal stenosis on the left at C4-5.   Mild prevertebral edema from C2-C4 is likely related to prominent anterior endplate osteophytes.   Mild discogenic edema at C3-4 which may contribute to neck pain.     Electronically Signed   By: Emily Filbert M.D.   On: 04/03/2024 15:13  I have personally reviewed the images and agree with the above interpretation.  Assessment and Plan: [name] is a pleasant 63 y.o. 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  He has severe weakness and has had rapidly worsening condition having lost the ability to walk over the past couple of days.  He is rapidly losing neurologic function.  I have recommended emergency surgical intervention to try to prevent any further neurologic deficits.  Any further delay would not be ethical in my professional opinion.  I have recommended surgical intervention with a C3-5 anterior cervical discectomy and fusion.  I discussed the planned procedure at length with the patient, including the risks, benefits, alternatives, and indications. The risks discussed include but are not limited to bleeding, infection, need for reoperation, spinal fluid leak, stroke, vision loss, anesthetic complication, coma, paralysis, and even death. We also discussed the possibility of post-operative dysphagia, vocal cord paralysis, and the risk of adjacent segment disease in the future. I also described in detail that improvement was not guaranteed.  The patient expressed understanding of these risks, and asked that we proceed with surgery. I described the surgery in layman's terms, and gave ample opportunity for questions, which were answered to the best of my ability.  Due to his rapid decline, I do not think it is reasonable to wait longer to proceed with surgical intervention.  As such, I recommend  proceeding with emergency surgical intervention even though he ate at noon.   I have communicated my recommendations to the requesting physician and coordinated care to facilitate these recommendations.     [name], [facility]

## 2024-04-03 NOTE — ED Provider Notes (Signed)
 [facility] Provider Note    Event Date/Time   First MD Initiated Contact with Patient 04/03/24 1144     (approximate)   History   Back Pain and Groin Pain   HPI  Nathaniel Hobbs is a 63 y.o. male history of previous hypertension hyperlipidemia and coronary disease  Patient reports that he started having several months of lower back pain, and about a month ago he started having weakness and numbness in his left leg and feeling like the muscles are tight in his left leg off and on.  This happened once before and the symptoms gone away.  He has had about 3 falls in the last month his daughter is helping him up on 2 of those occasions but today is similar happen.  When he "falls" he does not get injured.  Rather he bends over and he feels like he does not have the strength to get himself back up.  He denies any injury and did not actually fall, but rather reports even more has leg weakness to the point he cannot get himself back up  He relates that this started a few months ago when he had a back injury.  He is also noted that he is having some numbness in both hands, he sought [facility]     Physical Exam   Triage Vital Signs: ED Triage Vitals  Encounter Vitals Group     BP 04/03/24 1053 (!) 192/82     Systolic BP Percentile --      Diastolic BP Percentile --      Pulse Rate 04/03/24 1053 (!) 57     Resp 04/03/24 1053 16     Temp 04/03/24 1053 98.4 F (36.9 C)     Temp Source 04/03/24 1053 Oral     SpO2 04/03/24 1053 99 %     Weight 04/03/24 1054 219 lb (99.3 kg)     Height 04/03/24 1054 5\' 9"  (1.753 m)     Head Circumference --      Peak Flow --      Pain Score 04/03/24 1054 0     Pain Loc --      Pain Education --      Exclude from Growth Chart --     Most recent vital signs: Vitals:   04/03/24 1053 04/03/24 1812  BP: (!) 192/82 (!) 173/99  Pulse: (!) 57 (!) 55  Resp: 16 16  Temp: 98.4 F (36.9 C) 97.7 F (36.5 C)  SpO2: 99% 99%      General: Awake, no distress.  Very pleasant.  Cranial nerve exam normal ocular movements normal.  Facial expressions normal and symmetric. CV:  Good peripheral perfusion.  Normal tones and rate.  Strong palpable dorsalis pedis and posterior tibial pulses in both feet warm and well-perfused distally. Resp:  Normal effort.  Clear lungs Abd:  No distention.  Soft nontender nondistended Other:  No obvious acute focal muscular weakness involving lower extremities with exception to somewhat tensed are almost spasm-like tightness in his left lower foot.  He holds it in a slightly plantarflexed disposition.  He is able to demonstrate 5/5 strength at the hip flexors extensors bilateral.  No weakness in abduction abduction at the shoulders.  Reports the numbness or tingling runs out from his neck down towards his left upper shoulder and left mid forearm.  Able discriminate touch but reports it feels dulled over the left arm.  Additionally reports sense of dullness over the  left lower shin.  No obvious reproducible cervical thoracic or lumbar tenderness.  No obvious deformities.   ED Results / Procedures / Treatments   Labs (all labs ordered are listed, but only abnormal results are displayed) Labs Reviewed  COMPREHENSIVE METABOLIC PANEL WITH GFR - Abnormal; Notable for the following components:      Result Value   Glucose, Bld 108 (*)    Calcium 8.7 (*)    All other components within normal limits  CBC WITH DIFFERENTIAL/PLATELET  HIV ANTIBODY (ROUTINE TESTING W REFLEX)  CBC  BASIC METABOLIC PANEL WITH GFR    RADIOLOGY  MR LUMBAR SPINE WO CONTRAST Result Date: 04/03/2024 CLINICAL DATA:  Lower back pain, fall in November 2024, weakness of left leg. EXAM: MRI LUMBAR SPINE WITHOUT CONTRAST TECHNIQUE: Multiplanar, multisequence MR imaging of the lumbar spine was performed. No intravenous contrast was administered. COMPARISON:  Same day MRI cervical spine and MRI head. FINDINGS: Segmentation:   Standard. Alignment: Lumbar lordosis is maintained. No significant listhesis. Vertebrae: Slight irregularity of the L2 superior endplate with discogenic edema along the right aspect of the superior endplate without significant height loss, most likely related to chronic degenerative changes. Schmorl's nodes at multiple levels. No suspicious osseous lesion. Conus medullaris and cauda equina: Conus extends to the L1 level. Conus and cauda equina appear normal. Paraspinal and other soft tissues: Mild atrophy of the paraspinal musculature. The paraspinal soft tissues are otherwise unremarkable. Disc levels: T12-L1: Minimal disc bulge. Left foraminal disc protrusion. Mild-to-moderate facet arthrosis. No significant spinal canal stenosis. No significant foraminal stenosis. L1-2: Diffuse disc bulge which indents the ventral thecal sac. Moderate facet arthrosis. There is additional mild prominence of the dorsal epidural fat. Associated mild-to-moderate spinal canal stenosis with slight crowding of the cauda equina nerve roots. No significant foraminal stenosis. L2-3: Diffuse disc bulge which indents the ventral thecal sac. Moderate facet arthrosis. Mild prominence of the dorsal epidural fat. There is severe effacement of the thecal sac with crowding of the cauda equina nerve roots and complete effacement of CSF at this level within the thecal sac. Mild bilateral foraminal stenosis. L3-4: Diffuse disc bulge which indents the ventral thecal sac. Prominence of epidural fat surrounding the thecal sac. Moderate facet arthrosis. Severe effacement of the thecal sac at this level with complete effacement of CSF. Moderate right and mild left foraminal stenosis. L4-5: Diffuse disc bulge. Moderate facet arthrosis. Prominence of the epidural fat. Severe effacement of the thecal sac with crowding of the cauda equina nerve roots and complete effacement of CSF. Moderate to severe right and mild left foraminal stenosis. Disc bulge and  facet osteophytes likely contact the exiting right L4 nerve root. L5-S1: Diffuse disc bulge. Central annular fissure. Prominence of the epidural fat. Mild to moderate effacement of the thecal sac. Moderate facet arthrosis. Mild foraminal stenosis on the right. IMPRESSION: Mild irregularity of the L2 superior endplate with adjacent edema likely reflecting discogenic edema in the setting of degenerative changes. No height loss. Recommend correlation with history of trauma and tenderness at this level. Degenerative changes of the lumbar spine as above along with prominence of the epidural fat at multiple levels. There is severe effacement of the thecal sac at L2-3 through L4-5 as above. Multilevel foraminal stenosis, greatest and moderate to severe on the right at L4-5. Disc bulge and facet osteophytes at L4-5 contact the exiting right L4 nerve root. Small central annular fissure at L5-S1. Electronically Signed   By: Emily Filbert M.D.   On: 04/03/2024 15:22  MR Cervical Spine Wo Contrast Result Date: 04/03/2024 CLINICAL DATA:  Cervical radiculopathy, fall in November 2024, weakness. EXAM: MRI CERVICAL SPINE WITHOUT CONTRAST TECHNIQUE: Multiplanar, multisequence MR imaging of the cervical spine was performed. No intravenous contrast was administered. COMPARISON:  Same day MRI head. FINDINGS: Alignment: Cervical lordosis is maintained. Trace retrolisthesis of C4 on C5. Vertebrae: Degenerative endplate changes at multiple levels. There is mild discogenic edema at C3-4. Mild discogenic height loss of the C4 and C5 vertebral bodies. No evidence of acute fracture. Cord: There is cord compression at the C3-4 and C4-5 levels with diminished caliber of the cord also noted at the C5-6 level. Increased signal within the cervical cord at C3-4 and C4-5 most likely reflecting myelomalacia. Hemangioma in the T2 vertebral body. Posterior Fossa, vertebral arteries, paraspinal tissues: The visualized posterior fossa structures are  unremarkable. See same day MRI brain for complete evaluation intracranial findings. Prevertebral edema from C2-C4 likely related to prominent anterior endplate osteophytes. No significant edema within the paraspinal musculature posteriorly. No evidence of ligamentous injury. Disc levels: C2-3: Disc osteophyte complex slightly eccentric to the left which indents the ventral thecal sac without contacting the spinal cord. Uncovertebral hypertrophy more pronounced on the left. Bilateral facet arthrosis. Mild right and moderate left foraminal stenosis. C3-4: Mild disc height loss. Large disc osteophyte complex which compresses the ventral cervical cord. Mild thickening of the ligamentum flavum which indents the dorsal thecal sac. Severe spinal canal stenosis and cord compression with cord signal abnormality at this level. Bilateral facet arthrosis and uncovertebral hypertrophy. Severe bilateral foraminal stenosis. C4-5: Mild disc height loss. Large disc osteophyte complex eccentric to the right which compresses the ventral cervical cord. Thickening of the ligamentum flavum which indents the dorsal thecal sac. Severe spinal canal stenosis and cord compression with cord signal abnormality at this level. Uncovertebral hypertrophy more pronounced on the right. Bilateral facet arthrosis. Severe right and moderate to severe left foraminal stenosis. C5-6: Disc osteophyte complex and right subarticular disc protrusion which indents the ventral thecal sac. There is possible subtle flattening of the ventral cervical cord. Decreased cord caliber at this level with subtle cord signal abnormality without definite cord compression. Uncovertebral hypertrophy and facet arthrosis. Severe bilateral foraminal stenosis. C6-7: Disc osteophyte complex and left paracentral disc protrusion which indents the ventral thecal sac without contacting the spinal cord. Bilateral facet arthrosis. Uncovertebral hypertrophy more pronounced on the left.  Mild-to-moderate right and moderate left foraminal stenosis. C7-T1: Small central disc protrusion. No significant spinal canal stenosis. Bilateral facet arthrosis. No significant foraminal stenosis. IMPRESSION: Degenerative changes of the cervical spine as above. Severe spinal canal stenosis at C3-4 and C4-5 with associated cord compression. Cord signal abnormality at C3-4 and C4-5 along with cord volume loss suggestive of myelomalacia. Multilevel foraminal stenosis, greatest and severe bilaterally at C3-4, on the right at C4-5, and bilaterally at C5-6. Additional moderate to severe foraminal stenosis on the left at C4-5. Mild prevertebral edema from C2-C4 is likely related to prominent anterior endplate osteophytes. Mild discogenic edema at C3-4 which may contribute to neck pain. Electronically Signed   By: Emily Filbert M.D.   On: 04/03/2024 15:13   MR BRAIN WO CONTRAST Result Date: 04/03/2024 CLINICAL DATA:  Paresthesias, numbness or tingling for 1 month in the left leg and associated weakness. Carpal tunnel like symptoms of both upper extremities. EXAM: MRI HEAD WITHOUT CONTRAST TECHNIQUE: Multiplanar, multiecho pulse sequences of the brain and surrounding structures were obtained without intravenous contrast. COMPARISON:  None Available. FINDINGS: Brain:  No acute infarct. No evidence of intracranial hemorrhage. Nonspecific scattered T2/FLAIR hyperintensity in the periventricular and subcortical white matter. Possible small remote infarct in the left cerebellum. No mass lesion or midline shift. Normal appearance of midline structures. The basilar cisterns are patent. No extra-axial fluid collections. Ventricles: Normal size and configuration of the ventricles. Vascular: Skull base flow voids are visualized. 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 Skull and upper  cervical spine: Degenerative changes in the visualized upper cervical spine. There is suggestion of a large disc bulge at C3-4 better evaluated on same day MRI cervical spine. Visualized calvarium is unremarkable. Sinuses/Orbits: Orbits are symmetric. Paranasal sinuses are clear. Other: Mastoid air cells are clear. IMPRESSION: No acute intracranial abnormality. Nonspecific white matter signal abnormality which may reflect mild chronic microvascular ischemic changes. Additional considerations include sequelae of migraine headaches, prior infection/inflammatory process, or demyelination. 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 Electronically Signed   By: Emily Filbert M.D.   On: 04/03/2024 15:00      PROCEDURES:  Critical Care performed: Yes, see critical care procedure note(s)   Procedures   MEDICATIONS ORDERED IN ED: Medications  morphine (PF) 4 MG/ML injection 4 mg ( Intramuscular MAR Hold 04/03/24 1855)  sodium chloride flush (NS) 0.9 % injection 3 mL ( Intravenous Automatically Held 04/11/24 2200)  acetaminophen (TYLENOL) tablet 650 mg ( Oral MAR Hold 04/03/24 1855)    Or  acetaminophen (TYLENOL) suppository 650 mg ( Rectal MAR Hold 04/03/24 1855)  oxyCODONE (Oxy IR/ROXICODONE) immediate release tablet 5 mg ( Oral MAR Hold 04/03/24 1855)  HYDROmorphone (DILAUDID) injection 0.5-1 mg ( Intravenous MAR Hold 04/03/24 1855)  ondansetron (ZOFRAN) tablet 4 mg ( Oral MAR Hold 04/03/24 1855)    Or  ondansetron (ZOFRAN) injection 4 mg ( Intravenous MAR Hold 04/03/24 1855)  polyethylene glycol (MIRALAX / GLYCOLAX) packet 17 g ( Oral MAR Hold 04/03/24 1855)  atorvastatin (LIPITOR) tablet 80 mg (has no administration in time range)  enalapril (VASOTEC) tablet 10 mg (has no administration in time range)  gabapentin (NEURONTIN) capsule 400 mg (has no administration in time  range)  bupivacaine-epinephrine (PF) (MARCAINE W/ EPI) 0.5% -1:200000 injection (7 mLs Peri-NEURAL Given 04/03/24 1930)  Surgiflo with Thrombin (Hemostatic Matrix Kit) Optime (1 Application Topical Given 04/03/24 1939)  0.9 % irrigation (POUR BTL) (500 mLs Irrigation Given 04/03/24 1940)  HYDROcodone-acetaminophen (NORCO/VICODIN) 5-325 MG per tablet 2 tablet (2 tablets Oral Given 04/03/24 1232)     IMPRESSION / MDM / ASSESSMENT AND PLAN / ED COURSE  I reviewed the triage vital signs and the nursing notes.                              Differential diagnosis includes, but is not limited to, radiculopathy, spinal cord compression, central neurologic condition, stroke, herniated nucleus pulposus cauda equina etc.  Symptoms seem to initiate with paresthesias in both hands some across the left shoulder radiating from the mid cervical spine.  Additionally reports weakness in his left leg, and some tightness in the muscles there.  Reports some chronic low back pain as well after a fall 2 to 3 months ago but this seems to be more chronic in nature.  Denies any acute bowel or bladder changes or incontinence.  He does have  findings of subtle central neurologic cause or potentially compressive/radicular symptoms.  It is somewhat hard to delineate if all the symptoms initially correlate to 1 etiology.  Given the patient's history he is hemodynamics are stable afebrile status reassuring lab work I think MRI would be very helpful in ruling out central cause.  Given the chronicity of his symptoms, if his MRIs are normal anticipate he can be discharged with close follow-up.  He has already engaged with [facility], they have recommended and appears have been arranging for EMG testing  Patient's presentation is most consistent with acute complicated illness / injury requiring diagnostic workup.   Upon reviewing the patient's MRI of the brain and cervical spine, [place]urn for severe hide can neurologic process  including spinal cord compressive.  I consulted with our Dr. Marcell Barlow of neurosurgery who advises he will see and evaluate the patient.  Patient kept n.p.o. at this time, discussed with the patient and his wife they are understanding of plan for neurosurgical consultation.       FINAL CLINICAL IMPRESSION(S) / ED DIAGNOSES   Final diagnoses:  Myelopathy (HCC)     Rx / DC Orders   ED Discharge Orders     None        Note:  This document was prepared using Dragon voice recognition software and may include unintentional dictation errors.   Sharyn Creamer, MD 04/03/24 2047

## 2024-04-04 ENCOUNTER — Encounter: Payer: Self-pay | Admitting: Neurosurgery

## 2024-04-04 DIAGNOSIS — M4802 Spinal stenosis, cervical region: Secondary | ICD-10-CM | POA: Diagnosis not present

## 2024-04-04 DIAGNOSIS — G959 Disease of spinal cord, unspecified: Secondary | ICD-10-CM | POA: Diagnosis not present

## 2024-04-04 DIAGNOSIS — I2510 Atherosclerotic heart disease of native coronary artery without angina pectoris: Secondary | ICD-10-CM | POA: Diagnosis not present

## 2024-04-04 DIAGNOSIS — R531 Weakness: Secondary | ICD-10-CM | POA: Diagnosis not present

## 2024-04-04 DIAGNOSIS — E669 Obesity, unspecified: Secondary | ICD-10-CM

## 2024-04-04 LAB — BASIC METABOLIC PANEL WITH GFR
Anion gap: 8 (ref 5–15)
BUN: 9 mg/dL (ref 6–20)
CO2: 22 mmol/L (ref 22–32)
Calcium: 8.2 mg/dL — ABNORMAL LOW (ref 8.9–10.3)
Chloride: 105 mmol/L (ref 98–111)
Creatinine, Ser: 0.6 mg/dL — ABNORMAL LOW (ref 0.61–1.24)
GFR, Estimated: >60 mL/min (ref >60)
Glucose, Bld: 140 mg/dL — ABNORMAL HIGH (ref 70–99)
Potassium: 3.4 mmol/L — ABNORMAL LOW (ref 3.5–5.1)
Sodium: 135 mmol/L (ref 135–145)

## 2024-04-04 LAB — CBC
HCT: 41.2 % (ref 39.0–52.0)
Hemoglobin: 13.9 g/dL (ref 13.0–17.0)
MCH: 30.8 pg (ref 26.0–34.0)
MCHC: 33.7 g/dL (ref 30.0–36.0)
MCV: 91.2 fL (ref 80.0–100.0)
Platelets: 179 10*3/uL (ref 150–400)
RBC: 4.52 MIL/uL (ref 4.22–5.81)
RDW: 13.4 % (ref 11.5–15.5)
WBC: 8.6 10*3/uL (ref 4.0–10.5)
nRBC: 0 % (ref 0.0–0.2)

## 2024-04-04 LAB — HIV ANTIBODY (ROUTINE TESTING W REFLEX): HIV Screen 4th Generation wRfx: NONREACTIVE

## 2024-04-04 LAB — HEMOGLOBIN A1C
Hgb A1c MFr Bld: 5.6 % (ref 4.8–5.6)
Mean Plasma Glucose: 114 mg/dL

## 2024-04-04 NOTE — Assessment & Plan Note (Signed)
-  As above.

## 2024-04-04 NOTE — Plan of Care (Signed)

## 2024-04-04 NOTE — Discharge Instructions (Signed)
 Your surgeon has performed an operation on your cervical spine (neck) to relieve pressure on the spinal cord and/or nerves. This involved making an incision in the front of your neck and removing one or more of the discs that support your spine. Next, a small piece of bone, a titanium plate, and screws were used to fuse two or more of the vertebrae (bones) together.  The following are instructions to help in your recovery once you have been discharged from the hospital. Even if you feel well, it is important that you follow these activity guidelines. If you do not let your neck heal properly from the surgery, you can increase the chance of return of your symptoms and other complications.  * Do not take anti-inflammatory medications for 3 months after surgery (naproxen [Aleve], ibuprofen [Advil, Motrin], etc.). These medications can prevent your bones from healing properly.  Celebrex, if prescribed, is ok to take.  Activity    No bending, lifting, or twisting ("BLT"). Avoid lifting objects heavier than 10 pounds (gallon milk jug).  Where possible, avoid household activities that involve lifting, bending, reaching, pushing, or pulling such as laundry, vacuuming, grocery shopping, and childcare. Try to arrange for help from friends and family for these activities while your back heals.  Increase physical activity slowly as tolerated.  Taking short walks is encouraged, but avoid strenuous exercise. Do not jog, run, bicycle, lift weights, or participate in any other exercises unless specifically allowed by your doctor.  Talk to your doctor before resuming sexual activity.  You should not drive until cleared by your doctor.  Until released by your doctor, you should not return to work or school.  You should rest at home and let your body heal.   You may shower three days after your surgery.  After showering, lightly dab your incision dry. Do not take a tub bath or go swimming until approved by your  doctor at your follow-up appointment.  If your doctor ordered a cervical collar (neck brace) for you, you should wear it whenever you are out of bed. You may remove it when lying down or sleeping, but you should wear it at all other times. Not all neck surgeries require a cervical collar.  If you smoke, we strongly recommend that you quit.  Smoking has been proven to interfere with normal bone healing and will dramatically reduce the success rate of your surgery. Please contact [facility] (800-QUIT-NOW) and use the resources at www.[facility].com for assistance in stopping smoking.  Surgical Incision   If you have a dressing on your incision, you may remove it two days after your surgery. Keep your incision area clean and dry.  If you have staples or stitches on your incision, you should have a follow up scheduled for removal. If you do not have staples or stitches, you will have steri-strips (small pieces of surgical tape) or Dermabond glue. The steri-strips/glue should begin to peel away within about a week (it is fine if the steri-strips fall off before then). If the strips are still in place one week after your surgery, you may gently remove them.  Diet           You may return to your usual diet. However, you may experience discomfort when swallowing in the first month after your surgery. This is normal. You may find that softer foods are more comfortable for you to swallow. Be sure to stay hydrated.  When to Contact us  You may experience pain in your  neck and/or pain between your shoulder blades. This is normal and should improve in the next few weeks with the help of pain medication, muscle relaxers, and rest. Some patients report that a warm compress on the back of the neck or between the shoulder blades helps.  However, should you experience any of the following, contact us immediately: New numbness or weakness Pain that is progressively getting worse, and is not relieved by your pain  medication, muscle relaxers, rest, and warm compresses Bleeding, redness, swelling, pain, or drainage from surgical incision Chills or flu-like symptoms Fever greater than 101.0 F (38.3 C) Inability to eat, drink fluids, or take medications Problems with bowel or bladder functions Difficulty breathing or shortness of breath Warmth, tenderness, or swelling in your calf Contact Information How to contact us:  If you have any questions/concerns before or after surgery, you can reach [place] at [phone], or you can send a mychart message. We can be reached by phone or mychart 8am-4pm, Monday-Friday.  *Please note: Calls after 4pm are forwarded to a third party answering service. Mychart messages are not routinely monitored during evenings, weekends, and holidays. Please call our office to contact the answering service for urgent concerns during non-business hours.

## 2024-04-04 NOTE — Hospital Course (Addendum)
 63 y.o. male with medical history significant of Hypertension, hyperlipidemid, CAD s/p PCI (2009, 2013) with DES to mLAD (2013), prediabetes, who presents to the ED due to back pain.   Experiencing back pain with difficulty getting up from a kneeling position for several months now, however over the last few weeks, he has noticed a significant worsening.  Then today, he knelt down onto the ground and was unable to stand up on his own and required 3 people to help him up.  During this time, he has noticed worsening paresthesia of his bilateral upper extremities.  He has noticed significant weakness of his left arm and left leg.  This is led to him having difficulty walking.  MRI of the brain with no acute intracranial abnormality.  MRI of the C-spine notable for degenerative changes with severe spinal stenosis with associated cord compression.  MRI of the L-spine with degenerative changes in addition to severe effacement of the thecal sac.  Neurosurgery was consulted with plan for the OR tonight.  [facility] contacted for admission.  Patient was taken urgently to the operating room by Dr. Mont Antis on 4/10 for cervical myelopathy and cervical stenosis with weakness.  Patient had an anterior cervical discectomy and fusion at  C3/4 and C4/5.  4/11.  Patient feeling okay.  Moving his arms and legs a little bit better than what he previously was doing.  Able to straight leg raise today. 4/12.  Patient feeling better today.  He thinks he will be able to walk. 4/13.  Patient has not had a bowel movement in a couple days.

## 2024-04-04 NOTE — Progress Notes (Signed)
   Neurosurgery Progress Note  History: Nathaniel Hobbs is s/p C3-5 ACDF  POD1: Feels improved movement in his legs this morning and improved sensation of his arms. Overall feels pain is reasonably controlled on medications.   Physical Exam: Vitals:   04/04/24 0549 04/04/24 0807  BP: (!) 162/83 (!) 155/83  Pulse: (!) 59 (!) 55  Resp:  16  Temp:  98.1 F (36.7 C)  SpO2: 98% 97%    AA Ox3 CNI  Strength: Side Biceps Triceps Deltoid Interossei Grip Wrist Ext. Wrist Flex.  R 4+ 4+ 4+ 4 4 4+ 4+  L 4- 4 3 3 3 3 3     Side Iliopsoas Quads Hamstring PF DF EHL  R 5 5 5 5 5 5   L 4 4+ 4 4+ 5 5   Jp drain output 10 since surgery  Data:  Other tests/results: see results reviewed  Assessment/Plan:  Nathaniel Hobbs is a 63 y.o presenting with rapid neurologic decline found to have severe cervical stenosis s/p C3-5 ACDF   - mobilize - pain control - DVT prophylaxis - will continue JP for this morning due to late nature of case - PTOT  Manning Charity PA-C Department of Neurosurgery

## 2024-04-04 NOTE — Assessment & Plan Note (Addendum)
Continue working with PT and OT.

## 2024-04-04 NOTE — Assessment & Plan Note (Signed)
 Class I with a BMI of 34.18

## 2024-04-04 NOTE — Progress Notes (Signed)
 Progress Note   Patient: Nathaniel Hobbs ZOX:[id number] DOB: December 01, 1961 DOA: 04/03/2024     1 DOS: the patient was seen and examined on 04/04/2024   Brief hospital course: 63 y.o. male with medical history significant of Hypertension, hyperlipidemid, CAD s/p PCI (2009, 2013) with DES to mLAD (2013), prediabetes, who presents to the ED due to back pain.   Experiencing back pain with difficulty getting up from a kneeling position for several months now, however over the last few weeks, he has noticed a significant worsening.  Then today, he knelt down onto the ground and was unable to stand up on his own and required 3 people to help him up.  During this time, he has noticed worsening paresthesia of his bilateral upper extremities.  He has noticed significant weakness of his left arm and left leg.  This is led to him having difficulty walking.  MRI of the brain with no acute intracranial abnormality.  MRI of the C-spine notable for degenerative changes with severe spinal stenosis with associated cord compression.  MRI of the L-spine with degenerative changes in addition to severe effacement of the thecal sac.  Neurosurgery was consulted with plan for the OR tonight.  [facility] contacted for admission.  Patient was taken urgently to the operating room by Dr. Myer Haff on 4/10 for cervical myelopathy and cervical stenosis with weakness.  Patient had an anterior cervical discectomy and fusion at  C3/4 and C4/5.  4/11.  Patient feeling okay.  Moving his arms and legs a little bit better than what he previously was doing.  Able to straight leg raise today.  Assessment and Plan: * Cervical myelopathy (HCC) Patient is presenting with progressive bilateral upper extremity paresthesia and left-sided weakness with evidence of severe spinal stenosis with cord compression.  Neurosurgery took the patient urgently for cervical discectomy and fusion at C3/4 and C4/5 on the evening of 4/10.  Patient able to straight leg  raise.  Ordered PT and OT evaluations.  Cervical spinal stenosis As above  Weakness generalized PT and OT evaluation  CAD (coronary artery disease) Patient has a distant history of obstructive CAD with PCI in both 2008 and 2013.  He had a DES placed in the mid LAD in 2013.  Since then, he states he has been chest pain-free both with exertion and at rest.  - Resume home aspirin postoperatively once cleared by neurosurgery - Continue home statin  Benign essential hypertension Continue enalapril  Obesity (BMI 30-39.9) Class I with a BMI of 34.18        Subjective: Patient feeling okay.  Had neurosurgical procedure yesterday for cervical stenosis and cervical myelopathy  Physical Exam: Vitals:   04/03/24 2348 04/04/24 0058 04/04/24 0549 04/04/24 0807  BP: (!) 160/86 (!) 179/91 (!) 162/83 (!) 155/83  Pulse: 80 77 (!) 59 (!) 55  Resp: 18 18  16   Temp: 98.1 F (36.7 C) 98.6 F (37 C)  98.1 F (36.7 C)  TempSrc:    Oral  SpO2: 96% 97% 98% 97%  Weight: 105 kg     Height: 5\' 9"  (1.753 m)      Physical Exam HENT:     Head: Normocephalic.     Mouth/Throat:     Pharynx: No oropharyngeal exudate.  Eyes:     General: Lids are normal.     Conjunctiva/sclera: Conjunctivae normal.  Cardiovascular:     Rate and Rhythm: Normal rate and regular rhythm.     Heart sounds: Normal heart sounds, S1 normal  and S2 normal.  Pulmonary:     Breath sounds: No decreased breath sounds, wheezing, rhonchi or rales.  Abdominal:     Palpations: Abdomen is soft.     Tenderness: There is no abdominal tenderness.  Musculoskeletal:     Right lower leg: No swelling.     Left lower leg: No swelling.  Skin:    General: Skin is warm.     Findings: No rash.  Neurological:     Mental Status: He is alert and oriented to person, place, and time.     Comments: Patient able to straight leg raise bilaterally.     Data Reviewed: Potassium 3.4, creatinine 0.6, CBC normal range  Family Communication:  Updated wife on the phone  Disposition: Status is: Inpatient Remains inpatient appropriate because: Postoperative day 1 for neurosurgical procedure on cervical spine.  PT and OT consultations.  Planned Discharge Destination: Home with Home Health    Time spent: 28 minutes  Author: Alford Highland, MD 04/04/2024 2:26 PM  For on call review [email or web address].

## 2024-04-05 DIAGNOSIS — R531 Weakness: Secondary | ICD-10-CM | POA: Diagnosis not present

## 2024-04-05 DIAGNOSIS — K5909 Other constipation: Secondary | ICD-10-CM

## 2024-04-05 DIAGNOSIS — M4802 Spinal stenosis, cervical region: Secondary | ICD-10-CM | POA: Diagnosis not present

## 2024-04-05 DIAGNOSIS — I2510 Atherosclerotic heart disease of native coronary artery without angina pectoris: Secondary | ICD-10-CM | POA: Diagnosis not present

## 2024-04-05 DIAGNOSIS — K5900 Constipation, unspecified: Secondary | ICD-10-CM | POA: Insufficient documentation

## 2024-04-05 DIAGNOSIS — G959 Disease of spinal cord, unspecified: Secondary | ICD-10-CM | POA: Diagnosis not present

## 2024-04-05 MED ORDER — POLYETHYLENE GLYCOL 3350 17 G PO PACK
17.0000 g | PACK | Freq: Two times a day (BID) | ORAL | Status: DC
  Filled 2024-04-05 (×3): qty 1

## 2024-04-05 NOTE — Progress Notes (Signed)
 Progress Note   Patient: Nathaniel Hobbs VZD:[id number] DOB: March 16, 1961 DOA: 04/03/2024     2 DOS: the patient was seen and examined on 04/05/2024   Brief hospital course: 63 y.o. male with medical history significant of Hypertension, hyperlipidemid, CAD s/p PCI (2009, 2013) with DES to mLAD (2013), prediabetes, who presents to the ED due to back pain.   Experiencing back pain with difficulty getting up from a kneeling position for several months now, however over the last few weeks, he has noticed a significant worsening.  Then today, he knelt down onto the ground and was unable to stand up on his own and required 3 people to help him up.  During this time, he has noticed worsening paresthesia of his bilateral upper extremities.  He has noticed significant weakness of his left arm and left leg.  This is led to him having difficulty walking.  MRI of the brain with no acute intracranial abnormality.  MRI of the C-spine notable for degenerative changes with severe spinal stenosis with associated cord compression.  MRI of the L-spine with degenerative changes in addition to severe effacement of the thecal sac.  Neurosurgery was consulted with plan for the OR tonight.  [facility] contacted for admission.  Patient was taken urgently to the operating room by Dr. Mont Antis on 4/10 for cervical myelopathy and cervical stenosis with weakness.  Patient had an anterior cervical discectomy and fusion at  C3/4 and C4/5.  4/11.  Patient feeling okay.  Moving his arms and legs a little bit better than what he previously was doing.  Able to straight leg raise today.  Assessment and Plan: * Cervical myelopathy (HCC) Patient is presenting with progressive bilateral upper extremity paresthesia and left-sided weakness with evidence of severe spinal stenosis with cord compression.  Neurosurgery took the patient urgently for cervical discectomy and fusion at C3/4 and C4/5 on the evening of 4/10.  Patient able to straight leg  raise.  Continue working with PT and OT.  Possible candidate for acute inpatient rehab.  Cervical spinal stenosis As above  Weakness generalized Continue working with PT and OT  CAD (coronary artery disease) Patient has a distant history of obstructive CAD with PCI in both 2008 and 2013.  He had a DES placed in the mid LAD in 2013.  Since then, he states he has been chest pain-free both with exertion and at rest.  Resume home aspirin postoperatively once cleared by neurosurgery Continue home statin  Benign essential hypertension Continue enalapril  Obesity (BMI 30-39.9) Class I with a BMI of 34.18  Constipation Will give MiraLAX        Subjective: Patient seen this morning.  He states he is doing better.  He thinks he will be able to walk today.  Patient did walk 30 feet with physical therapy.  Still has some weakness in his legs and his arms but doing a little bit better after the surgery.  Admitted with cervical myelopathy.  Physical Exam: Vitals:   04/04/24 2213 04/04/24 2300 04/05/24 0417 04/05/24 0749  BP: (!) 125/59 (!) 125/59 (!) 128/56 (!) 153/74  Pulse: 71  (!) 58 (!) 57  Resp: 17  18 16   Temp: 98.3 F (36.8 C)  98 F (36.7 C) 98.8 F (37.1 C)  TempSrc: Oral  Oral Oral  SpO2: 95%  92% 91%  Weight:      Height:       Physical Exam HENT:     Head: Normocephalic.     Mouth/Throat:  Pharynx: No oropharyngeal exudate.  Eyes:     General: Lids are normal.     Conjunctiva/sclera: Conjunctivae normal.  Cardiovascular:     Rate and Rhythm: Normal rate and regular rhythm.     Heart sounds: Normal heart sounds, S1 normal and S2 normal.  Pulmonary:     Breath sounds: No decreased breath sounds, wheezing, rhonchi or rales.  Abdominal:     Palpations: Abdomen is soft.     Tenderness: There is no abdominal tenderness.  Musculoskeletal:     Right lower leg: No swelling.     Left lower leg: No swelling.  Skin:    General: Skin is warm.     Findings: No  rash.  Neurological:     Mental Status: He is alert and oriented to person, place, and time.     Comments: Patient able to straight leg raise bilaterally.  Power 4 out of 5 bilateral upper extremities and lower extremities     Data Reviewed: Creatinine 0.6, potassium 3.4, CBC normal range  Disposition: Status is: Inpatient Remains inpatient appropriate because: Continue working with physical therapy.  Planned Discharge Destination: Possible acute inpatient rehab    Time spent: 27 minutes  Author: Verla Glaze, MD 04/05/2024 1:44 PM  For on call review [email or web address].

## 2024-04-05 NOTE — Evaluation (Signed)
 Physical Therapy Evaluation Patient Details Name: Nathaniel Hobbs MRN: [id number] DOB: 08-13-1961 Today's Date: 04/05/2024  History of Present Illness  63 y/o male presented to ED on 04/03/24 for weakness in BLEs and inability to ambulate. 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 S/p ACDF C3-5 on 4/10. PMH: HTN, CAD  Clinical Impression  Patient admitted with the above. PTA, patient lives with wife and daughter and reports he was independent but having trouble completing mobility and ADLs due to weakness. Patient demonstrates BLE weakness (L>R), BLE stiffness/spasticity, impaired sensation, impaired coordintaion, and decreased activity tolerance. Patient required maxA+2 bed mobility and modA+2 sit to stand transfer. Ambulated ~30' with RW and minA+2 for balance and RW management with knees flexed throughout. Patient with slight decreased insight into deficits and decreased safety awareness. Patient will benefit from skilled PT services during acute stay to address listed deficits. Patient will benefit from ongoing therapy at discharge to maximize functional independence and safety.         If plan is discharge home, recommend the following: A little help with walking and/or transfers;A lot of help with bathing/dressing/bathroom;Assistance with cooking/housework;Assist for transportation;Help with stairs or ramp for entrance   Can travel by private vehicle        Equipment Recommendations Rolling Umaiza Matusik (2 wheels);BSC/3in1  Recommendations for Other Services  Rehab consult    Functional Status Assessment Patient has had a recent decline in their functional status and demonstrates the ability to make significant improvements in function in a reasonable and predictable amount of time.     Precautions / Restrictions Precautions Precautions: Fall;Cervical Precaution Booklet Issued: No Recall of Precautions/Restrictions:  Intact Restrictions Weight Bearing Restrictions Per Provider Order: No      Mobility  Bed Mobility Overal bed mobility: Needs Assistance Bed Mobility: Rolling, Sidelying to Sit Rolling: +2 for physical assistance, +2 for safety/equipment, Min assist Sidelying to sit: Max assist, +2 for physical assistance, +2 for safety/equipment            Transfers Overall transfer level: Needs assistance   Transfers: Sit to/from Stand Sit to Stand: Mod assist, +2 physical assistance, +2 safety/equipment                Ambulation/Gait Ambulation/Gait assistance: Min assist, +2 physical assistance, +2 safety/equipment Gait Distance (Feet): 30 Feet Assistive device: Rolling Eyanna Mcgonagle (2 wheels) Gait Pattern/deviations: Step-through pattern, Decreased stride length, Knee flexed in stance - right, Knee flexed in stance - left Gait velocity: decreased     General Gait Details: assist for RW management and balance. B knees flexed throughout. After ~15', patient with trembling knees but lacks awareness of need to return to recliner in room  Stairs            Wheelchair Mobility     Tilt Bed    Modified Rankin (Stroke Patients Only)       Balance Overall balance assessment: Needs assistance Sitting-balance support: Feet supported, No upper extremity supported Sitting balance-Leahy Scale: Poor Sitting balance - Comments: initially with posterior lean with inability to correct. Once feet on ground, patient with improved sitting balance   Standing balance support: Bilateral upper extremity supported, During functional activity Standing balance-Leahy Scale: Poor                               Pertinent Vitals/Pain Pain Assessment Pain Assessment: Faces Faces Pain Scale: Hurts a little bit  Pain Location: neck Pain Descriptors / Indicators: Grimacing Pain Intervention(s): Limited activity within patient's tolerance, Monitored during session, Repositioned     Home Living Family/patient expects to be discharged to:: Private residence Living Arrangements: Spouse/significant other Available Help at Discharge: Family;Available 24 hours/day Type of Home: House Home Access: Stairs to enter   [employer] of Steps: 1   Home Layout: One level Home Equipment: Agricultural consultant (2 wheels);Cane - single point      Prior Function Prior Level of Function : Independent/Modified Independent             Mobility Comments: difficulty completing basic tasks, however independent       Extremity/Trunk Assessment   Upper Extremity Assessment Upper Extremity Assessment: Defer to OT evaluation    Lower Extremity Assessment Lower Extremity Assessment: Generalized weakness (L>R; impaired sensation in B LEs, spasticity in L>R)    Cervical / Trunk Assessment Cervical / Trunk Assessment: Neck Surgery  Communication   Communication Communication: No apparent difficulties    Cognition Arousal: Alert Behavior During Therapy: WFL for tasks assessed/performed   PT - Cognitive impairments: No apparent impairments                       PT - Cognition Comments: decreased safety awareness at times and decreased insight into full extent of deficits Following commands: Intact       Cueing       General Comments      Exercises     Assessment/Plan    PT Assessment Patient needs continued PT services  PT Problem List Decreased strength;Decreased activity tolerance;Decreased balance;Decreased mobility;Decreased coordination;Decreased knowledge of use of DME;Decreased safety awareness;Decreased knowledge of precautions;Impaired sensation       PT Treatment Interventions DME instruction;Gait training;Functional mobility training;Stair training;Therapeutic activities;Therapeutic exercise;Balance training;Neuromuscular re-education;Patient/family education    PT Goals (Current goals can be found in the Care Plan section)  Acute  Rehab PT Goals Patient Stated Goal: to get back to normal PT Goal Formulation: With patient Time For Goal Achievement: 04/19/24 Potential to Achieve Goals: Good    Frequency Min 3X/week     Co-evaluation PT/OT/SLP Co-Evaluation/Treatment: Yes Reason for Co-Treatment: For patient/therapist safety;To address functional/ADL transfers PT goals addressed during session: Mobility/safety with mobility;Balance;Proper use of DME         AM-PAC PT "6 Clicks" Mobility  Outcome Measure Help needed turning from your back to your side while in a flat bed without using bedrails?: Total Help needed moving from lying on your back to sitting on the side of a flat bed without using bedrails?: Total Help needed moving to and from a bed to a chair (including a wheelchair)?: Total Help needed standing up from a chair using your arms (e.g., wheelchair or bedside chair)?: Total Help needed to walk in hospital room?: Total Help needed climbing 3-5 steps with a railing? : Total 6 Click Score: 6    End of Session   Activity Tolerance: Patient tolerated treatment well Patient left: in chair;with call bell/phone within reach;with chair alarm set Nurse Communication: Mobility status PT Visit Diagnosis: Unsteadiness on feet (R26.81);Muscle weakness (generalized) (M62.81);Other abnormalities of gait and mobility (R26.89);Other symptoms and signs involving the nervous system (R29.898)    Time: 0940-1004 PT Time Calculation (min) (ACUTE ONLY): 24 min   Charges:   PT Evaluation $PT Eval Moderate Complexity: 1 Mod   PT General Charges $$ ACUTE PT VISIT: 1 Visit         Janine Melbourne, PT,  DPT Physical Therapist - [facility]  [facility]   Dwane Andres A Terica Yogi 04/05/2024, 11:41 AM

## 2024-04-05 NOTE — Assessment & Plan Note (Signed)
 Will give MiraLAX

## 2024-04-05 NOTE — Evaluation (Signed)
 Occupational Therapy Evaluation Patient Details Name: Nathaniel Hobbs MRN: [id number] DOB: [birth year] Today's Date: 04/05/2024   History of Present Illness   63 y/o male presented to ED on 04/03/24 for weakness in BLEs and inability to ambulate. 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 S/p ACDF C3-5 on 4/10. PMH: HTN, CAD     Clinical Impressions Patient presenting with decreased Ind in self care,balance, functional mobility/transfers, endurance, and safety awareness.  Patient reports being Mod I at baseline and working full time. He does endorse that things had been getting more difficult and he was experiencing UE and LE numbness prior to surgery. Patient currently functioning at max A of 3 for bed mobility and mod A of 2 to stand from standard bed height. Pt needing min A of 2 for ambulation to bathroom with RW. Pt stands to urinate but needing assistance for balance and clothing management for safety. Pt is far from baseline and very motivated. Patient will benefit from acute OT to increase overall independence in the areas of ADLs, functional mobility, and safety awareness in order to safely discharge.     If plan is discharge home, recommend the following:   A lot of help with walking and/or transfers;A lot of help with bathing/dressing/bathroom;Assistance with cooking/housework;Assist for transportation;Help with stairs or ramp for entrance     Functional Status Assessment   Patient has had a recent decline in their functional status and demonstrates the ability to make significant improvements in function in a reasonable and predictable amount of time.     Equipment Recommendations   Other (comment) (defer to next venue of care)      Precautions/Restrictions   Precautions Precautions: Fall;Cervical Precaution Booklet Issued: No Recall of Precautions/Restrictions: Intact Restrictions Weight  Bearing Restrictions Per Provider Order: No     Mobility Bed Mobility Overal bed mobility: Needs Assistance Bed Mobility: Rolling, Sidelying to Sit Rolling: +2 for physical assistance, +2 for safety/equipment, Min assist Sidelying to sit: Max assist, +2 for physical assistance, +2 for safety/equipment            Transfers Overall transfer level: Needs assistance   Transfers: Sit to/from Stand Sit to Stand: Mod assist, +2 physical assistance, +2 safety/equipment                  Balance Overall balance assessment: Needs assistance Sitting-balance support: Feet supported, No upper extremity supported Sitting balance-Leahy Scale: Poor Sitting balance - Comments: initially with posterior lean with inability to correct. Once feet on ground, patient with improved sitting balance   Standing balance support: Bilateral upper extremity supported, During functional activity Standing balance-Leahy Scale: Poor                             ADL either performed or assessed with clinical judgement   ADL Overall ADL's : Needs assistance/impaired                                       General ADL Comments: Total A to don B socks and pt stands to urinate with min A for standing balance and assistance to manage clothing.     Vision Patient Visual Report: No change from baseline              Pertinent Vitals/Pain Pain Assessment Pain Assessment: Faces Faces  Pain Scale: Hurts a little bit Pain Location: neck Pain Descriptors / Indicators: Grimacing Pain Intervention(s): Limited activity within patient's tolerance, Monitored during session, Repositioned     Extremity/Trunk Assessment Upper Extremity Assessment Upper Extremity Assessment: Generalized weakness (B UE numbness and pain radiating down from neck. FMC and strength deficits. Pt able to feed self but unable to open containers.)   Lower Extremity Assessment Lower Extremity Assessment:  Generalized weakness   Cervical / Trunk Assessment Cervical / Trunk Assessment: Neck Surgery   Communication Communication Communication: No apparent difficulties   Cognition Arousal: Alert Behavior During Therapy: [facility] for tasks assessed/performed                                 Following commands: Intact                  Home Living Family/patient expects to be discharged to:: Private residence Living Arrangements: Spouse/significant other Available Help at Discharge: Family;Available 24 hours/day Type of Home: House Home Access: Stairs to enter [employer] of Steps: 1   Home Layout: One level     Bathroom Shower/Tub: Tub/shower unit         Home Equipment: Agricultural consultant (2 wheels);Cane - single point          Prior Functioning/Environment Prior Level of Function : Independent/Modified Independent             Mobility Comments: difficulty completing basic tasks, however independent      OT Problem List: Decreased strength;Decreased coordination;Pain;Decreased range of motion;Decreased activity tolerance;Decreased safety awareness;Impaired sensation;Impaired balance (sitting and/or standing);Decreased knowledge of use of DME or AE;Decreased knowledge of precautions;Impaired UE functional use   OT Treatment/Interventions: Self-care/ADL training;Therapeutic exercise;Therapeutic activities;DME and/or AE instruction;Patient/family education;Balance training      OT Goals(Current goals can be found in the care plan section)   Acute Rehab OT Goals Patient Stated Goal: to go home OT Goal Formulation: With patient Time For Goal Achievement: 04/19/24 Potential to Achieve Goals: Fair ADL Goals Pt Will Perform Grooming: with supervision;standing Pt Will Perform Lower Body Dressing: with supervision;sit to/from stand;with adaptive equipment Pt Will Transfer to Toilet: with supervision;ambulating Pt Will Perform Toileting - Clothing  Manipulation and hygiene: with supervision;sit to/from stand   OT Frequency:  Min 2X/week    Co-evaluation   Reason for Co-Treatment: For patient/therapist safety;To address functional/ADL transfers PT goals addressed during session: Mobility/safety with mobility;Balance;Proper use of DME        AM-PAC OT "6 Clicks" Daily Activity     Outcome Measure Help from another person eating meals?: A Little Help from another person taking care of personal grooming?: A Little Help from another person toileting, which includes using toliet, bedpan, or urinal?: A Lot Help from another person bathing (including washing, rinsing, drying)?: A Lot Help from another person to put on and taking off regular upper body clothing?: A Lot Help from another person to put on and taking off regular lower body clothing?: A Lot 6 Click Score: 14   End of Session Equipment Utilized During Treatment: Rolling walker (2 wheels) Nurse Communication: Mobility status  Activity Tolerance: Patient tolerated treatment well Patient left: in bed;with call bell/phone within reach;with bed alarm set  OT Visit Diagnosis: Unsteadiness on feet (R26.81);Repeated falls (R29.6);Muscle weakness (generalized) (M62.81)                Time: 0940-1005 OT Time Calculation (min): 25 min Charges:  OT  General Charges $OT Visit: 1 Visit OT Evaluation $OT Eval Moderate Complexity: 1 [address] [place], OTR/L , CBIS ascom [phone]  04/05/24, 1:56 PM

## 2024-04-05 NOTE — Plan of Care (Signed)
  Problem: Pain Managment: Goal: General experience of comfort will improve and/or be controlled Outcome: Progressing   Problem: Safety: Goal: Ability to remain free from injury will improve Outcome: Progressing   Problem: Skin Integrity: Goal: Risk for impaired skin integrity will decrease Outcome: Progressing

## 2024-04-05 NOTE — Progress Notes (Signed)
 Neurosurgery Progress Note  History: Nathaniel Hobbs is s/p C3-5 ACDF. Currently 2 Days Post-Op.   POD2: Nathaniel Hobbs is feeling better.  Notices improvement in his arms and legs.  Otherwise feels quite stiff. POD1: Feels improved movement in his legs this morning and improved sensation of his arms. Overall feels pain is reasonably controlled on medications.   Physical Exam: Vitals:   04/05/24 0417 04/05/24 0749  BP: (!) 128/56 (!) 153/74  Pulse: (!) 58 (!) 57  Resp: 18 16  Temp: 98 F (36.7 C) 98.8 F (37.1 C)  SpO2: 92% 91%    AA Ox3 CNI  Strength: Side Biceps Triceps Deltoid Interossei Grip Wrist Ext. Wrist Flex.  R 4+ 4+ 4+ 4 4 4+ 4+  L 4- 4 3 3 3 3 3     Side Iliopsoas Quads Hamstring PF DF EHL  R 5 5 5 5 5 5   L 4 4+ 4 4+ 5 5   Jp drain output 50 yesterday update not reported for today's output.  Data: DG Cervical Spine 2-3 Views Result Date: 04/03/2024 CLINICAL DATA:  Cervical myelopathy, intraoperative exam EXAM: CERVICAL SPINE - 2-3 VIEW COMPARISON:  04/03/2024 FINDINGS: Four fluoroscopic images are obtained during the performance of the procedure and are submitted for interpretation only. Initial image demonstrates instrument overlying the C4 vertebral body. Subsequent images demonstrate ACDF at C3-4 and C4-5. Alignment is anatomic. Please refer to the operative report. Fluoroscopy time: 7 seconds, 1.4534 mGy IMPRESSION: 1. Intraoperative exam as above.  Please refer to operative report. Electronically Signed   By: Bobbye Burrow M.D.   On: 04/03/2024 23:38   DG C-Arm 1-60 Min-No Report Result Date: 04/03/2024 Fluoroscopy was utilized by the requesting physician.  No radiographic interpretation.   DG C-Arm 1-60 Min-No Report Result Date: 04/03/2024 Fluoroscopy was utilized by the requesting physician.  No radiographic interpretation.   DG C-Arm 1-60 Min-No Report Result Date: 04/03/2024 Fluoroscopy was utilized by the requesting physician.  No radiographic interpretation.    MR LUMBAR SPINE WO CONTRAST Result Date: 04/03/2024 CLINICAL DATA:  Lower back pain, fall in November 2024, weakness of left leg. EXAM: MRI LUMBAR SPINE WITHOUT CONTRAST TECHNIQUE: Multiplanar, multisequence MR imaging of the lumbar spine was performed. No intravenous contrast was administered. COMPARISON:  Same day MRI cervical spine and MRI head. FINDINGS: Segmentation:  Standard. Alignment: Lumbar lordosis is maintained. No significant listhesis. Vertebrae: Slight irregularity of the L2 superior endplate with discogenic edema along the right aspect of the superior endplate without significant height loss, most likely related to chronic degenerative changes. Schmorl's nodes at multiple levels. No suspicious osseous lesion. Conus medullaris and cauda equina: Conus extends to the L1 level. Conus and cauda equina appear normal. Paraspinal and other soft tissues: Mild atrophy of the paraspinal musculature. The paraspinal soft tissues are otherwise unremarkable. Disc levels: T12-L1: Minimal disc bulge. Left foraminal disc protrusion. Mild-to-moderate facet arthrosis. No significant spinal canal stenosis. No significant foraminal stenosis. L1-2: Diffuse disc bulge which indents the ventral thecal sac. Moderate facet arthrosis. There is additional mild prominence of the dorsal epidural fat. Associated mild-to-moderate spinal canal stenosis with slight crowding of the cauda equina nerve roots. No significant foraminal stenosis. L2-3: Diffuse disc bulge which indents the ventral thecal sac. Moderate facet arthrosis. Mild prominence of the dorsal epidural fat. There is severe effacement of the thecal sac with crowding of the cauda equina nerve roots and complete effacement of CSF at this level within the thecal sac. Mild bilateral foraminal stenosis. L3-4: Diffuse  disc bulge which indents the ventral thecal sac. Prominence of epidural fat surrounding the thecal sac. Moderate facet arthrosis. Severe effacement of the  thecal sac at this level with complete effacement of CSF. Moderate right and mild left foraminal stenosis. L4-5: Diffuse disc bulge. Moderate facet arthrosis. Prominence of the epidural fat. Severe effacement of the thecal sac with crowding of the cauda equina nerve roots and complete effacement of CSF. Moderate to severe right and mild left foraminal stenosis. Disc bulge and facet osteophytes likely contact the exiting right L4 nerve root. L5-S1: Diffuse disc bulge. Central annular fissure. Prominence of the epidural fat. Mild to moderate effacement of the thecal sac. Moderate facet arthrosis. Mild foraminal stenosis on the right. IMPRESSION: Mild irregularity of the L2 superior endplate with adjacent edema likely reflecting discogenic edema in the setting of degenerative changes. No height loss. Recommend correlation with history of trauma and tenderness at this level. Degenerative changes of the lumbar spine as above along with prominence of the epidural fat at multiple levels. There is severe effacement of the thecal sac at L2-3 through L4-5 as above. Multilevel foraminal stenosis, greatest and moderate to severe on the right at L4-5. Disc bulge and facet osteophytes at L4-5 contact the exiting right L4 nerve root. Small central annular fissure at L5-S1. Electronically Signed   By: Denny Flack M.D.   On: 04/03/2024 15:22   MR Cervical Spine Wo Contrast Result Date: 04/03/2024 CLINICAL DATA:  Cervical radiculopathy, fall in November 2024, weakness. EXAM: MRI CERVICAL SPINE WITHOUT CONTRAST TECHNIQUE: Multiplanar, multisequence MR imaging of the cervical spine was performed. No intravenous contrast was administered. COMPARISON:  Same day MRI head. FINDINGS: Alignment: Cervical lordosis is maintained. Trace retrolisthesis of C4 on C5. Vertebrae: Degenerative endplate changes at multiple levels. There is mild discogenic edema at C3-4. Mild discogenic height loss of the C4 and C5 vertebral bodies. No evidence of  acute fracture. Cord: There is cord compression at the C3-4 and C4-5 levels with diminished caliber of the cord also noted at the C5-6 level. Increased signal within the cervical cord at C3-4 and C4-5 most likely reflecting myelomalacia. Hemangioma in the T2 vertebral body. Posterior Fossa, vertebral arteries, paraspinal tissues: The visualized posterior fossa structures are unremarkable. See same day MRI brain for complete evaluation intracranial findings. Prevertebral edema from C2-C4 likely related to prominent anterior endplate osteophytes. No significant edema within the paraspinal musculature posteriorly. No evidence of ligamentous injury. Disc levels: C2-3: Disc osteophyte complex slightly eccentric to the left which indents the ventral thecal sac without contacting the spinal cord. Uncovertebral hypertrophy more pronounced on the left. Bilateral facet arthrosis. Mild right and moderate left foraminal stenosis. C3-4: Mild disc height loss. Large disc osteophyte complex which compresses the ventral cervical cord. Mild thickening of the ligamentum flavum which indents the dorsal thecal sac. Severe spinal canal stenosis and cord compression with cord signal abnormality at this level. Bilateral facet arthrosis and uncovertebral hypertrophy. Severe bilateral foraminal stenosis. C4-5: Mild disc height loss. Large disc osteophyte complex eccentric to the right which compresses the ventral cervical cord. Thickening of the ligamentum flavum which indents the dorsal thecal sac. Severe spinal canal stenosis and cord compression with cord signal abnormality at this level. Uncovertebral hypertrophy more pronounced on the right. Bilateral facet arthrosis. Severe right and moderate to severe left foraminal stenosis. C5-6: Disc osteophyte complex and right subarticular disc protrusion which indents the ventral thecal sac. There is possible subtle flattening of the ventral cervical cord. Decreased cord caliber at  this level  with subtle cord signal abnormality without definite cord compression. Uncovertebral hypertrophy and facet arthrosis. Severe bilateral foraminal stenosis. C6-7: Disc osteophyte complex and left paracentral disc protrusion which indents the ventral thecal sac without contacting the spinal cord. Bilateral facet arthrosis. Uncovertebral hypertrophy more pronounced on the left. Mild-to-moderate right and moderate left foraminal stenosis. C7-T1: Small central disc protrusion. No significant spinal canal stenosis. Bilateral facet arthrosis. No significant foraminal stenosis. IMPRESSION: Degenerative changes of the cervical spine as above. Severe spinal canal stenosis at C3-4 and C4-5 with associated cord compression. Cord signal abnormality at C3-4 and C4-5 along with cord volume loss suggestive of myelomalacia. Multilevel foraminal stenosis, greatest and severe bilaterally at C3-4, on the right at C4-5, and bilaterally at C5-6. Additional moderate to severe foraminal stenosis on the left at C4-5. Mild prevertebral edema from C2-C4 is likely related to prominent anterior endplate osteophytes. Mild discogenic edema at C3-4 which may contribute to neck pain. Electronically Signed   By: Denny Flack M.D.   On: 04/03/2024 15:13   MR BRAIN WO CONTRAST Result Date: 04/03/2024 CLINICAL DATA:  Paresthesias, numbness or tingling for 1 month in the left leg and associated weakness. Carpal tunnel like symptoms of both upper extremities. EXAM: MRI HEAD WITHOUT CONTRAST TECHNIQUE: Multiplanar, multiecho pulse sequences of the brain and surrounding structures were obtained without intravenous contrast. COMPARISON:  None Available. FINDINGS: Brain: No acute infarct. No evidence of intracranial hemorrhage. Nonspecific scattered T2/FLAIR hyperintensity in the periventricular and subcortical white matter. Possible small remote infarct in the left cerebellum. No mass lesion or midline shift. Normal appearance of midline structures. The  basilar cisterns are patent. No extra-axial fluid collections. Ventricles: Normal size and configuration of the ventricles. Vascular: Skull base flow voids are visualized. 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 Skull and upper cervical spine: Degenerative changes in the visualized upper cervical spine. There is suggestion of a large disc bulge at C3-4 better evaluated on same day MRI cervical spine. Visualized calvarium is unremarkable. Sinuses/Orbits: Orbits are symmetric. Paranasal sinuses are clear. Other: Mastoid air cells are clear. IMPRESSION: No acute intracranial abnormality. Nonspecific white matter signal abnormality which may reflect mild chronic microvascular ischemic changes. Additional considerations include sequelae of migraine headaches, prior infection/inflammatory process, or demyelination. 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 Electronically Signed   By: Denny Flack M.D.   On: 04/03/2024 15:00    Other tests/results: see results reviewed  Assessment/Plan:  Jordon Kristiansen is a 63 y.o presenting with rapid neurologic decline found to have severe cervical stenosis s/p C3-5 ACDF   - Continue to mobilize - pain control, wean IV medications as able - DVT prophylaxis - will continue JP, likely removal tomorrow morning given continued output. - PTOT  Carroll Clamp, MD Department of Neurosurgery

## 2024-04-06 DIAGNOSIS — I2510 Atherosclerotic heart disease of native coronary artery without angina pectoris: Secondary | ICD-10-CM | POA: Diagnosis not present

## 2024-04-06 DIAGNOSIS — R531 Weakness: Secondary | ICD-10-CM | POA: Diagnosis not present

## 2024-04-06 DIAGNOSIS — M4802 Spinal stenosis, cervical region: Secondary | ICD-10-CM | POA: Diagnosis not present

## 2024-04-06 DIAGNOSIS — G959 Disease of spinal cord, unspecified: Secondary | ICD-10-CM | POA: Diagnosis not present

## 2024-04-06 LAB — CBC
HCT: 39.6 % (ref 39.0–52.0)
Hemoglobin: 13.3 g/dL (ref 13.0–17.0)
MCH: 30.6 pg (ref 26.0–34.0)
MCHC: 33.6 g/dL (ref 30.0–36.0)
MCV: 91.2 fL (ref 80.0–100.0)
Platelets: 177 10*3/uL (ref 150–400)
RBC: 4.34 MIL/uL (ref 4.22–5.81)
RDW: 13.4 % (ref 11.5–15.5)
WBC: 7.4 10*3/uL (ref 4.0–10.5)
nRBC: 0 % (ref 0.0–0.2)

## 2024-04-06 LAB — BASIC METABOLIC PANEL WITH GFR
Anion gap: 8 (ref 5–15)
BUN: 13 mg/dL (ref 6–20)
CO2: 27 mmol/L (ref 22–32)
Calcium: 8.4 mg/dL — ABNORMAL LOW (ref 8.9–10.3)
Chloride: 104 mmol/L (ref 98–111)
Creatinine, Ser: 0.74 mg/dL (ref 0.61–1.24)
GFR, Estimated: >60 mL/min (ref >60)
Glucose, Bld: 93 mg/dL (ref 70–99)
Potassium: 3.8 mmol/L (ref 3.5–5.1)
Sodium: 139 mmol/L (ref 135–145)

## 2024-04-06 MED ORDER — BISACODYL 10 MG RE SUPP: 10.0000 mg | Freq: Every day | RECTAL | Status: DC | PRN

## 2024-04-06 MED ORDER — ENOXAPARIN SODIUM 40 MG/0.4ML IJ SOSY
40.0000 mg | PREFILLED_SYRINGE | INTRAMUSCULAR | Status: DC
  Administered 2024-04-06 – 2024-04-07 (×2): 40 mg via SUBCUTANEOUS
  Filled 2024-04-06 (×2): qty 0.4

## 2024-04-06 MED ORDER — LACTULOSE 10 GM/15ML PO SOLN
30.0000 g | Freq: Every day | ORAL | Status: DC | PRN
  Administered 2024-04-06: 30 g via ORAL
  Filled 2024-04-06: qty 60

## 2024-04-06 MED ORDER — SENNA 8.6 MG PO TABS
2.0000 | ORAL_TABLET | Freq: Every day | ORAL | Status: DC
  Administered 2024-04-06: 17.2 mg via ORAL
  Filled 2024-04-06 (×2): qty 2

## 2024-04-06 MED ORDER — HYDROMORPHONE HCL 1 MG/ML IJ SOLN
0.5000 mg | INTRAMUSCULAR | Status: DC | PRN
  Administered 2024-04-06 – 2024-04-07 (×7): 0.5 mg via INTRAVENOUS
  Filled 2024-04-06 (×7): qty 0.5

## 2024-04-06 NOTE — Progress Notes (Signed)
 Neurosurgery Progress Note  History: Nathaniel Hobbs is s/p C3-5 ACDF. Currently 3 Days Post-Op.   POD 3: Drain Removed, continues to have stiffness and some urinary frequency without burning  POD2: States that he is feeling better.  Notices improvement in his arms and legs.  Otherwise feels quite stiff. POD1: Feels improved movement in his legs this morning and improved sensation of his arms. Overall feels pain is reasonably controlled on medications.   Physical Exam: Vitals:   04/06/24 0412 04/06/24 0823  BP: (!) 163/78 (!) 142/75  Pulse: 62 (!) 57  Resp: 18 18  Temp: 99.5 F (37.5 C) 98.8 F (37.1 C)  SpO2: 96% 97%    AA Ox3 CNI  Strength: Side Biceps Triceps Deltoid Interossei Grip Wrist Ext. Wrist Flex.  R 4+ 4+ 4+ 4 4 4+ 4+  L 4- 4 3 3 3 3 3     Side Iliopsoas Quads Hamstring PF DF EHL  R 5 5 5 5 5 5   L 4 4+ 4 4+ 5 5   Jp drain output 50 yesterday update not reported for today's output.  Data: No results found.   Other tests/results: see results reviewed  Assessment/Plan:  Nathaniel Hobbs is a 63 y.o presenting with rapid neurologic decline found to have severe cervical stenosis s/p C3-5 ACDF   - Continue to mobilize - pain control, wean IV medications as able - DVT prophylaxis - will continue JP, likely removal tomorrow morning given continued output. - PTOT  Carroll Clamp, MD Department of Neurosurgery

## 2024-04-06 NOTE — Plan of Care (Signed)

## 2024-04-06 NOTE — Progress Notes (Signed)
 Progress Note   Patient: Nathaniel Hobbs BJY:[id number] DOB: [birth year] DOA: 04/03/2024     3 DOS: the patient was seen and examined on 04/06/2024   Brief hospital course: 63 y.o. male with medical history significant of Hypertension, hyperlipidemid, CAD s/p PCI (2009, 2013) with DES to mLAD (2013), prediabetes, who presents to the ED due to back pain.   Experiencing back pain with difficulty getting up from a kneeling position for several months now, however over the last few weeks, he has noticed a significant worsening.  Then today, he knelt down onto the ground and was unable to stand up on his own and required 3 people to help him up.  During this time, he has noticed worsening paresthesia of his bilateral upper extremities.  He has noticed significant weakness of his left arm and left leg.  This is led to him having difficulty walking.  MRI of the brain with no acute intracranial abnormality.  MRI of the C-spine notable for degenerative changes with severe spinal stenosis with associated cord compression.  MRI of the L-spine with degenerative changes in addition to severe effacement of the thecal sac.  Neurosurgery was consulted with plan for the OR tonight.  [facility] contacted for admission.  Patient was taken urgently to the operating room by Dr. Mont Antis on 4/10 for cervical myelopathy and cervical stenosis with weakness.  Patient had an anterior cervical discectomy and fusion at  C3/4 and C4/5.  4/11.  Patient feeling okay.  Moving his arms and legs a little bit better than what he previously was doing.  Able to straight leg raise today. 4/12.  Patient feeling better today.  He thinks he will be able to walk. 4/13.  Patient has not had a bowel movement in a couple days.  Assessment and Plan: * Cervical myelopathy (HCC) Patient is presenting with progressive bilateral upper extremity paresthesia and left-sided weakness with evidence of severe spinal stenosis with cord compression.  Neurosurgery  took the patient urgently for cervical discectomy and fusion at C3/4 and C4/5 on the evening of 4/10.  Patient able to straight leg raise.  Continue working with PT and OT.  Possible candidate for acute inpatient rehab.  Wean IV pain medications and use oral.  Cervical spinal stenosis As above  Weakness generalized Continue working with PT and OT  CAD (coronary artery disease) Patient has a distant history of obstructive CAD with PCI in both 2008 and 2013.  He had a DES placed in the mid LAD in 2013.  Since then, he states he has been chest pain-free both with exertion and at rest. Continue home statin  Benign essential hypertension Continue enalapril  Obesity (BMI 30-39.9) Class I with a BMI of 34.18  Constipation MiraLAX twice daily and lactulose and Dulcolax suppository as needed.        Subjective: Patient feeling okay.  Able to straight leg raise.  States he is urinating well.  Physical Exam: Vitals:   04/05/24 1529 04/05/24 2138 04/06/24 0412 04/06/24 0823  BP: 125/63 137/68 (!) 163/78 (!) 142/75  Pulse: (!) 57 (!) 58 62 (!) 57  Resp: 17 18 18 18   Temp: 97.8 F (36.6 C) 98.4 F (36.9 C) 99.5 F (37.5 C) 98.8 F (37.1 C)  TempSrc:      SpO2: 94% 97% 96% 97%  Weight:      Height:       Physical Exam HENT:     Head: Normocephalic.     Mouth/Throat:  Pharynx: No oropharyngeal exudate.  Eyes:     General: Lids are normal.     Conjunctiva/sclera: Conjunctivae normal.  Cardiovascular:     Rate and Rhythm: Normal rate and regular rhythm.     Heart sounds: Normal heart sounds, S1 normal and S2 normal.  Pulmonary:     Breath sounds: No decreased breath sounds, wheezing, rhonchi or rales.  Abdominal:     Palpations: Abdomen is soft.     Tenderness: There is no abdominal tenderness.  Musculoskeletal:     Right lower leg: No swelling.     Left lower leg: No swelling.  Skin:    General: Skin is warm.     Findings: No rash.  Neurological:     Mental  Status: He is alert and oriented to person, place, and time.     Comments: Patient able to straight leg raise bilaterally.  Power 4 out of 5 bilateral upper extremities and lower extremities     Data Reviewed: Potassium 3.8, creatinine 0.74, CBC normal range  Family Communication: Updated wife on the phone  Disposition: Status is: Inpatient Remains inpatient appropriate because: Neurosurgery to evaluate for drain removal potentially tomorrow.  TOC to look into acute inpatient rehab possibilities  Planned Discharge Destination: Potentially acute inpatient rehab    Time spent: 28 minutes  Author: Verla Glaze, MD 04/06/2024 1:20 PM  For on call review [email or web address].

## 2024-04-06 NOTE — Progress Notes (Signed)
 Mobility Specialist - Progress Note   04/06/24 0827  Mobility  Activity Stood at bedside  Level of Assistance +2 (takes two people)  Location manager Ambulated (ft) 0 ft  Activity Response Tolerated well  Mobility Referral Yes  Mobility visit 1 Mobility  Mobility Specialist Start Time (ACUTE ONLY) [id number]  Mobility Specialist Stop Time (ACUTE ONLY) [id number]  Mobility Specialist Time Calculation (min) (ACUTE ONLY) 14 min    Pt sitting in recliner requesting to stand to use urinal. Pt STS MinA +2 and able to maintain balance standing CGA for 1~2 minutes. Pt returns to recliner with needs in reach and NT present.   Wash Hack  Mobility Specialist  04/06/24 8:28 AM

## 2024-04-07 DIAGNOSIS — G959 Disease of spinal cord, unspecified: Secondary | ICD-10-CM | POA: Diagnosis not present

## 2024-04-07 DIAGNOSIS — I2510 Atherosclerotic heart disease of native coronary artery without angina pectoris: Secondary | ICD-10-CM | POA: Diagnosis not present

## 2024-04-07 DIAGNOSIS — M4802 Spinal stenosis, cervical region: Secondary | ICD-10-CM | POA: Diagnosis not present

## 2024-04-07 DIAGNOSIS — R531 Weakness: Secondary | ICD-10-CM | POA: Diagnosis not present

## 2024-04-07 MED ORDER — ASPIRIN 81 MG PO TBEC
81.0000 mg | DELAYED_RELEASE_TABLET | Freq: Every day | ORAL | Status: DC
  Administered 2024-04-08: 81 mg via ORAL
  Filled 2024-04-07: qty 1

## 2024-04-07 MED ORDER — ORAL CARE MOUTH RINSE: 15.0000 mL | OROMUCOSAL | Status: DC | PRN

## 2024-04-07 MED ORDER — POLYETHYLENE GLYCOL 3350 17 G PO PACK: 17.0000 g | PACK | Freq: Every day | ORAL | Status: DC | PRN

## 2024-04-07 NOTE — PMR Pre-admission (Signed)
 PMR Admission Coordinator Pre-Admission Assessment  Patient: Nathaniel Hobbs is an 63 y.o., male MRN: [id number] DOB: Nov 27, 1961 Height: 5\' 9"  (175.3 cm) Weight: 105 kg              Insurance Information HMO: yes    PPO:      PCP:      IPA:      80/20:      OTHER:  PRIMARY: [employer] QHP      Policy#: [id number]      Subscriber: pt CM Name: faxed approval, name not provided      Phone#: [phone]     Fax#: [phone] Pre-Cert#:  [id number] auth for CIR via fax with updates due to fax listed above on 4/21      Employer:  Benefits:  Phone #: [phone]     Name:  Eff. Date: 12/26/23     Deduct: $7500 (met $342.89)      Out of Pocket Max: $9200 (met $345.37)      Life Max: n/a  CIR: 50%      SNF: 50% Outpatient: 50%     Co-Pay: 50% Home Health: 50%      Co-Pay: 50% DME: 50%     Co-Pay: 50% Providers:  SECONDARY:       Policy#:       Phone#:   Artist:       Phone#:   The Engineer, materials Information Summary" for patients in Inpatient Rehabilitation Facilities with attached "Privacy Act Statement-Health Care Records" was provided and verbally reviewed with: Patient and Family  Emergency Contact Information Contact Information     Name Relation Home Work Mobile   [place] D Spouse [phone]        Other Contacts   None on File    Current Medical History  Patient Admitting Diagnosis: cervical myelopathy  History of Present Illness: Pt is a 63 y/o male with PMH of HTN, CAD s/p PCI (09 and 13), DM, who presented to [facility] on 04/03/24 with worsening back pain and progressive weakness/paresthesias in all 4 extremities.  MRI spine showed degenerative changes with severe spinal stenosis and associated cord compression in the c-spine, as well as degenerative changes and severe effacement of the thecal sac in the lumbar spine.  Neurosurgery consulted and pt was taken emergently to the OR for ACDF of C3-5.  Post op course pain management and bowel/bladder  management.  Therapy evaluations completed and pt was recommended for CIR.     Glasgow Coma Scale Score: 15  Patient's medical record from [facility] has been reviewed by the rehabilitation admission coordinator and physician.  Past Medical History  Past Medical History:  Diagnosis Date   High cholesterol    Hypertension     Has the patient had major surgery during 100 days prior to admission? Yes  Family History  family history is not on file.   Current Medications   Current Facility-Administered Medications:    acetaminophen (TYLENOL) tablet 650 mg, 650 mg, Oral, Q6H PRN, 650 mg at 04/05/24 1445 **OR** acetaminophen (TYLENOL) suppository 650 mg, 650 mg, Rectal, Q6H PRN, Jodeen Munch, MD   atorvastatin (LIPITOR) tablet 80 mg, 80 mg, Oral, QHS, Jodeen Munch, MD, 80 mg at 04/06/24 2206   bisacodyl (DULCOLAX) suppository 10 mg, 10 mg, Rectal, Daily PRN, Clelia Current, Richard, MD   enalapril (VASOTEC) tablet 10 mg, 10 mg, Oral, BID, Jodeen Munch, MD, 10 mg at 04/07/24 0853   enoxaparin (LOVENOX) injection 40 mg, 40 mg, Subcutaneous, Q24H,  Alford Highland, MD, 40 mg at 04/06/24 1342   gabapentin (NEURONTIN) capsule 400 mg, 400 mg, Oral, Daily, Venetia Night, MD, 400 mg at 04/07/24 4098   HYDROmorphone (DILAUDID) injection 0.5 mg, 0.5 mg, Intravenous, Q3H PRN, Alford Highland, MD, 0.5 mg at 04/07/24 0855   lactulose (CHRONULAC) 10 GM/15ML solution 30 g, 30 g, Oral, Daily PRN, Renae Gloss, Richard, MD, 30 g at 04/06/24 1342   methocarbamol (ROBAXIN) tablet 500 mg, 500 mg, Oral, Q6H PRN, Venetia Night, MD, 500 mg at 04/07/24 0852   ondansetron (ZOFRAN) tablet 4 mg, 4 mg, Oral, Q6H PRN **OR** ondansetron (ZOFRAN) injection 4 mg, 4 mg, Intravenous, Q6H PRN, Venetia Night, MD, 4 mg at 04/04/24 1819   Oral care mouth rinse, 15 mL, Mouth Rinse, PRN, Wieting, Richard, MD   oxyCODONE (Oxy IR/ROXICODONE) immediate release tablet 5 mg, 5 mg, Oral, Q6H PRN, Venetia Night, MD,  5 mg at 04/07/24 0603   polyethylene glycol (MIRALAX / GLYCOLAX) packet 17 g, 17 g, Oral, Daily PRN, Renae Gloss, Richard, MD   senna (SENOKOT) tablet 17.2 mg, 2 tablet, Oral, Daily, Wieting, Richard, MD, 17.2 mg at 04/06/24 0857   sodium chloride flush (NS) 0.9 % injection 3 mL, 3 mL, Intravenous, Q12H, Venetia Night, MD, 3 mL at 04/07/24 1191  Patients Current Diet:  Diet Order             Diet regular Room service appropriate? Yes; Fluid consistency: Thin  Diet effective now                   Precautions / Restrictions Precautions Precautions: Fall, Cervical Precaution Booklet Issued: No Restrictions Weight Bearing Restrictions Per Provider Order: No   Has the patient had 2 or more falls or a fall with injury in the past year?Yes  Prior Activity Level Community (5-7x/wk): independent prior to admission, working FT, driving  Prior Functional Level Prior Function Prior Level of Function : Independent/Modified Independent Mobility Comments: difficulty completing basic tasks, however independent  Self Care: Did the patient need help bathing, dressing, using the toilet or eating?  Independent  Indoor Mobility: Did the patient need assistance with walking from room to room (with or without device)? Independent  Stairs: Did the patient need assistance with internal or external stairs (with or without device)? Independent  Functional Cognition: Did the patient need help planning regular tasks such as shopping or remembering to take medications? Independent  Patient Information Are you of Hispanic, Latino/a,or Spanish origin?: A. No, not of Hispanic, Latino/a, or Spanish origin What is your race?: B. Black or African American Do you need or want an interpreter to communicate with a doctor or health care staff?: 0. No  Patient's Response To:  Health Literacy and Transportation Is the patient able to respond to health literacy and transportation needs?: Yes Health  Literacy - How often do you need to have someone help you when you read instructions, pamphlets, or other written material from your doctor or pharmacy?: Never In the past 12 months, has lack of transportation kept you from medical appointments or from getting medications?: No In the past 12 months, has lack of transportation kept you from meetings, work, or from getting things needed for daily living?: No  Home Assistive Devices / Equipment Home Equipment: Agricultural consultant (2 wheels), [employer] - single point  Prior Device Use: Indicate devices/aids used by the patient prior to current illness, exacerbation or injury? None of the above  Current Functional Level Cognition  Orientation Level: Oriented X4  Extremity Assessment (includes Sensation/Coordination)  Upper Extremity Assessment: Generalized weakness (B UE numbness and pain radiating down from neck. FMC and strength deficits. Pt able to feed self but unable to open containers.)  Lower Extremity Assessment: Generalized weakness    ADLs  Overall ADL's : Needs assistance/impaired General ADL Comments: Total A to don B socks and pt stands to urinate with min A for standing balance and assistance to manage clothing.    Mobility  Overal bed mobility: Needs Assistance Bed Mobility: Rolling, Sidelying to Sit Rolling: +2 for physical assistance, +2 for safety/equipment, Min assist Sidelying to sit: Max assist, +2 for physical assistance, +2 for safety/equipment    Transfers  Overall transfer level: Needs assistance Transfers: Sit to/from Stand Sit to Stand: Mod assist, +2 physical assistance, +2 safety/equipment    Ambulation / Gait / Stairs / Wheelchair Mobility  Ambulation/Gait Ambulation/Gait assistance: Min assist, +2 physical assistance, +2 safety/equipment Gait Distance (Feet): 30 Feet Assistive device: Rolling walker (2 wheels) Gait Pattern/deviations: Step-through pattern, Decreased stride length, Knee flexed in stance - right,  Knee flexed in stance - left General Gait Details: assist for RW management and balance. B knees flexed throughout. After ~15', patient with trembling knees but lacks awareness of need to return to recliner in room Gait velocity: decreased    Posture / Balance Dynamic Sitting Balance Sitting balance - Comments: initially with posterior lean with inability to correct. Once feet on ground, patient with improved sitting balance Balance Overall balance assessment: Needs assistance Sitting-balance support: Feet supported, No upper extremity supported Sitting balance-Leahy Scale: Poor Sitting balance - Comments: initially with posterior lean with inability to correct. Once feet on ground, patient with improved sitting balance Standing balance support: Bilateral upper extremity supported, During functional activity Standing balance-Leahy Scale: Poor    Special needs/care consideration Skin surgical incision to anterior neck,  and Diabetic management yes     Previous Home Environment (from acute therapy documentation) Living Arrangements: Spouse/significant other Available Help at Discharge: Family, Available 24 hours/day Type of Home: House Home Layout: One level Home Access: Stairs to enter Secretary/administrator of Steps: 1 Bathroom Shower/Tub: Tub/shower unit Home Care Services: No  Discharge Living Setting Plans for Discharge Living Setting: Patient's home, Lives with (comment) (spouse and daughter) Type of Home at Discharge: House Discharge Home Layout: One level Discharge Home Access: Stairs to enter Entrance Stairs-Rails: None Entrance Stairs-Number of Steps: 1 onto deck Discharge Bathroom Shower/Tub: Tub/shower unit Discharge Bathroom Toilet: Standard Discharge Bathroom Accessibility: Yes How Accessible: Accessible via walker Does the patient have any problems obtaining your medications?: No  Social/Family/Support Systems Patient Roles: Spouse Anticipated Caregiver:  spouse, Adriene Chervenak Anticipated Caregiver's Contact Information: [phone] Ability/Limitations of Caregiver: works [employer] but can take FMLA if he needs 24/7 Caregiver Availability: 24/7 Discharge Plan Discussed with Primary Caregiver: Yes Is Caregiver In Agreement with Plan?: Yes   Goals Patient/Family Goal for Rehab: PT/OT supervision to mod I, SLP n/a Expected length of stay: 14-16 days Additional Information: Discharge plan: plan for intermittent mod I goals.  Spouse works [employer], and daughter leaves for work at [employer] (leaving ~2 hours where pt would be alone).  If patient is not safe to be alone for 2 hours, spouse can take FMLA to cover 24/7. Pt/Family Agrees to Admission and willing to participate: Yes Program Orientation Provided & Reviewed with Pt/Caregiver Including Roles  & Responsibilities: Yes   Decrease burden of Care through IP rehab admission: n/a   Possible need for SNF placement upon discharge:  Not anticipated.  Plan to d/c to pt's home at intermittent mod I level.  If he is able to attain this, he will only be alone for about 2 hrs/day.  If he requires 24/7 supervision his spouse can take FMLA and provide supervision.    Patient Condition: This patient's condition remains as documented in the consult dated 04/07/24, in which the Rehabilitation Physician determined and documented that the patient's condition is appropriate for intensive rehabilitative care in an inpatient rehabilitation facility. Will admit to inpatient rehab today.  Preadmission Screen Completed By:  Caitlin E Warren, PT, DPT 04/07/2024 10:30 AM ______________________________________________________________________   Discussed status with Dr. Rayleen Cal on 04/08/24 at 10:34 AM  and received approval for admission today.  Admission Coordinator:  Caitlin E Warren, PT, DPT time 10:34 AM Alanna Hu 04/08/24

## 2024-04-07 NOTE — Progress Notes (Signed)
 Progress Note   Patient: Nathaniel Hobbs ZOX:[id number] DOB: 16-Sep-1961 DOA: 04/03/2024     4 DOS: the patient was seen and examined on 04/07/2024   Brief hospital course: 63 y.o. male with medical history significant of Hypertension, hyperlipidemid, CAD s/p PCI (2009, 2013) with DES to mLAD (2013), prediabetes, who presents to the ED due to back pain.   Experiencing back pain with difficulty getting up from a kneeling position for several months now, however over the last few weeks, he has noticed a significant worsening.  Then today, he knelt down onto the ground and was unable to stand up on his own and required 3 people to help him up.  During this time, he has noticed worsening paresthesia of his bilateral upper extremities.  He has noticed significant weakness of his left arm and left leg.  This is led to him having difficulty walking.  MRI of the brain with no acute intracranial abnormality.  MRI of the C-spine notable for degenerative changes with severe spinal stenosis with associated cord compression.  MRI of the L-spine with degenerative changes in addition to severe effacement of the thecal sac.  Neurosurgery was consulted with plan for the OR tonight.  TRH contacted for admission.  Patient was taken urgently to the operating room by Dr. Myer Haff on 4/10 for cervical myelopathy and cervical stenosis with weakness.  Patient had an anterior cervical discectomy and fusion at  C3/4 and C4/5.  4/11.  Patient feeling okay.  Moving his arms and legs a little bit better than what he previously was doing.  Able to straight leg raise today. 4/12.  Patient feeling better today.  He thinks he will be able to walk. 4/13.  Patient had bowel movements with multiple medications. 4/14.  Neurosurgery took out the drain.  Cleared to get out of the hospital whenever insurance authorizes the acute inpatient rehab.  Assessment and Plan: * Cervical myelopathy (HCC) Patient is presenting with progressive  bilateral upper extremity paresthesia and left-sided weakness with evidence of severe spinal stenosis with cord compression.  Neurosurgery took the patient urgently for cervical discectomy and fusion at C3/4 and C4/5 on the evening of 4/10.   Continue working with PT and OT.  Patient doing better with moving around and with coordination than when he came in.  Wean IV pain medications and use oral.  Insurance authorization needed for acute inpatient rehab.  Cervical spinal stenosis As above  Weakness generalized Continue working with PT and OT  CAD (coronary artery disease) Patient has a distant history of obstructive CAD with PCI in both 2008 and 2013.  He had a DES placed in the mid LAD in 2013.  Continue home statin, restart aspirin tomorrow  Benign essential hypertension Continue enalapril  Obesity (BMI 30-39.9) Class I with a BMI of 34.18  Constipation MiraLAX as needed, continue senna        Subjective: Patient is starting to feel stronger every day.  Starting to get better coordination with his fingers.  Able to move his legs a little bit better.  Had bowel movement last night.  Physical Exam: Vitals:   04/06/24 2004 04/07/24 0511 04/07/24 0846 04/07/24 0853  BP: 131/65 118/75 (!) 156/80 (!) 156/80  Pulse: 63 (!) 57 67   Resp: 18 16 16    Temp: 98.7 F (37.1 C) 97.9 F (36.6 C) 99.8 F (37.7 C)   TempSrc:   Oral   SpO2: 99% 95% 93%   Weight:      Height:  Physical Exam HENT:     Head: Normocephalic.     Mouth/Throat:     Pharynx: No oropharyngeal exudate.  Eyes:     General: Lids are normal.     Conjunctiva/sclera: Conjunctivae normal.  Cardiovascular:     Rate and Rhythm: Normal rate and regular rhythm.     Heart sounds: Normal heart sounds, S1 normal and S2 normal.  Pulmonary:     Breath sounds: No decreased breath sounds, wheezing, rhonchi or rales.  Abdominal:     Palpations: Abdomen is soft.     Tenderness: There is no abdominal tenderness.   Musculoskeletal:     Right lower leg: No swelling.     Left lower leg: No swelling.  Skin:    General: Skin is warm.     Findings: No rash.  Neurological:     Mental Status: He is alert and oriented to person, place, and time.     Comments: Patient able to lift knees up off the chair.  Patient able to touch each of his fingers with his thumb.  Little harder with his left hand.     Data Reviewed: Creatinine 0.74, hemoglobin 13.3  Family Communication: Updated patient's wife on the phone  Disposition: Status is: Inpatient Remains inpatient appropriate because: They are starting insurance authorization for acute inpatient rehab.  Can go to rehab once bed available and insurance authorization obtained.  Planned Discharge Destination: Acute inpatient rehab.    Time spent: 28 minutes  Author: Verla Glaze, MD 04/07/2024 4:15 PM  For on call review [email or web address].

## 2024-04-07 NOTE — Plan of Care (Signed)

## 2024-04-07 NOTE — Progress Notes (Signed)
 Physical Therapy Treatment Patient Details Name: Nathaniel Hobbs MRN: [id number] DOB: 25-Apr-1961 Today's Date: 04/07/2024   History of Present Illness 63 y/o male presented to ED on 04/03/24 for weakness in BLEs and inability to ambulate. 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 S/p ACDF C3-5 on 4/10. PMH: HTN, CAD    PT Comments  Pt seen for PT tx with pt agreeable to tx, motivated to participate. Pt demonstrates impaired neuromuscular control of BUE, L worse than R, requiring assistance for hand placement on recliner armrest but encouragement to attempt or use RUE to assist. Pt requires cuing for anterior weight shift to increase ease of STS. Pt ambulates to hallway & back multiple times with seated rest between each trial. Pt initially shuffling feet but with improved clearance with cuing. Pt would benefit from ongoing PT treatment to progress mobility as able.   If plan is discharge home, recommend the following: A little help with walking and/or transfers;A lot of help with bathing/dressing/bathroom;Assistance with cooking/housework;Assist for transportation;Help with stairs or ramp for entrance   Can travel by private vehicle        Equipment Recommendations  Rolling walker (2 wheels);BSC/3in1    Recommendations for Other Services Rehab consult     Precautions / Restrictions Precautions Precautions: Fall;Cervical Recall of Precautions/Restrictions: Intact Restrictions Weight Bearing Restrictions Per Provider Order: No     Mobility  Bed Mobility               General bed mobility comments: not tested, pt received & left sitting in recliner    Transfers Overall transfer level: Needs assistance Equipment used: Rolling walker (2 wheels) Transfers: Sit to/from Stand Sit to Stand: Mod assist, Min assist           General transfer comment: STS from recliner with cuing re: hand placement, extra  time to scoot out to edge of seat & power up to standing. Assistance with hand placement with LUE on armrest.    Ambulation/Gait Ambulation/Gait assistance: Min assist Gait Distance (Feet): 45 Feet (+ 75 ft) Assistive device: Rolling walker (2 wheels) Gait Pattern/deviations: Decreased step length - right, Decreased step length - left, Decreased dorsiflexion - right, Decreased dorsiflexion - left, Decreased stride length Gait velocity: decreased     General Gait Details: decreased heel strike & dorsiflexion, decreased step length BLE, flexed posture throughout but pt able to self correct, requires brief standing breaks 2/2 back "tightening" up   Stairs             Wheelchair Mobility     Tilt Bed    Modified Rankin (Stroke Patients Only)       Balance Overall balance assessment: Needs assistance   Sitting balance-Leahy Scale: Fair     Standing balance support: During functional activity, Bilateral upper extremity supported, Reliant on assistive device for balance Standing balance-Leahy Scale: Poor                              Communication Communication Communication: No apparent difficulties  Cognition Arousal: Alert Behavior During Therapy: WFL for tasks assessed/performed   PT - Cognitive impairments: No apparent impairments                         Following commands: Intact      Cueing Cueing Techniques: Verbal cues  Exercises      General Comments  General comments (skin integrity, edema, etc.): Pt reports feeling "scared" of objects near his walking path (ex: trashcan) with PT providing reassurance on ability to manuever RW.      Pertinent Vitals/Pain Pain Assessment Pain Assessment: Faces Faces Pain Scale: Hurts even more Pain Location: back Pain Descriptors / Indicators: Tightness Pain Intervention(s): Monitored during session, Utilized relaxation techniques    Home Living                          Prior  Function            PT Goals (current goals can now be found in the care plan section) Acute Rehab PT Goals Patient Stated Goal: to get back to normal PT Goal Formulation: With patient Time For Goal Achievement: 04/19/24 Potential to Achieve Goals: Good Progress towards PT goals: Progressing toward goals    Frequency    Min 3X/week      PT Plan      Co-evaluation              AM-PAC PT "6 Clicks" Mobility   Outcome Measure  Help needed turning from your back to your side while in a flat bed without using bedrails?: A Little Help needed moving from lying on your back to sitting on the side of a flat bed without using bedrails?: A Lot Help needed moving to and from a bed to a chair (including a wheelchair)?: A Little Help needed standing up from a chair using your arms (e.g., wheelchair or bedside chair)?: A Lot Help needed to walk in hospital room?: A Little Help needed climbing 3-5 steps with a railing? : A Lot 6 Click Score: 15    End of Session   Activity Tolerance: Patient tolerated treatment well Patient left: in chair;with call bell/phone within reach Nurse Communication: Mobility status PT Visit Diagnosis: Unsteadiness on feet (R26.81);Muscle weakness (generalized) (M62.81);Other abnormalities of gait and mobility (R26.89);Other symptoms and signs involving the nervous system (R29.898);Pain;Difficulty in walking, not elsewhere classified (R26.2) Pain - part of body:  (back)     Time: 1610-9604 PT Time Calculation (min) (ACUTE ONLY): 24 min  Charges:    $Therapeutic Activity: 23-37 mins PT General Charges $$ ACUTE PT VISIT: 1 Visit                     [name], PT, DPT 04/07/24, 1:27 PM  [name] 04/07/2024, 1:13 PM

## 2024-04-07 NOTE — Progress Notes (Signed)
 Physical Medicine & Rehabilitation Consult Service  Pt discussed with rehab admissions coordinator. Chart has been reviewed. This is a 63 yo male with hx of CAD, HTN who was admitted with progressive weakness in his legs. He was found to have progressive cervical spondylitic myelopathy with critical stenosis at C3-C5. Pt underwent ACDF C3-5 on 4/10.    Home: Home Living Family/patient expects to be discharged to:: Private residence Living Arrangements: Spouse/significant other Available Help at Discharge: Family, Available 24 hours/day Type of Home: House Home Access: Stairs to enter Secretary/administrator of Steps: 1 Home Layout: One level Bathroom Shower/Tub: Tub/shower unit Home Equipment: Agricultural consultant (2 wheels), [employer] - single point  Functional History: Prior Function Prior Level of Function : Independent/Modified Independent Mobility Comments: difficulty completing basic tasks, however independent Functional Status:  Mobility: Bed Mobility Overal bed mobility: Needs Assistance Bed Mobility: Rolling, Sidelying to Sit Rolling: +2 for physical assistance, +2 for safety/equipment, Min assist Sidelying to sit: Max assist, +2 for physical assistance, +2 for safety/equipment General bed mobility comments: not tested, pt received & left sitting in recliner Transfers Overall transfer level: Needs assistance Equipment used: Rolling walker (2 wheels) Transfers: Sit to/from Stand Sit to Stand: Mod assist, Min assist General transfer comment: STS from recliner with cuing re: hand placement, extra time to scoot out to edge of seat & power up to standing. Assistance with hand placement with LUE on armrest. Ambulation/Gait Ambulation/Gait assistance: Min assist Gait Distance (Feet): 45 Feet (+ 75 ft) Assistive device: Rolling walker (2 wheels) Gait Pattern/deviations: Decreased step length - right, Decreased step length - left, Decreased dorsiflexion - right, Decreased dorsiflexion -  left, Decreased stride length General Gait Details: decreased heel strike & dorsiflexion, decreased step length BLE, flexed posture throughout but pt able to self correct, requires brief standing breaks 2/2 back "tightening" up Gait velocity: decreased    ADL: ADL Overall ADL's : Needs assistance/impaired General ADL Comments: Total A to don B socks and pt stands to urinate with min A for standing balance and assistance to manage clothing.  Cognition: Cognition Orientation Level: Oriented X4 Cognition Arousal: Alert Behavior During Therapy: WFL for tasks assessed/performed   Assessment: 63 yo with cervical stenosis and myelopathy s/p ACDF C3-5. Pt with persistent motor and sensory deficits in left upper > right lower extremities.  Post-operative pain Post-op wound care HTN CAD Neurogenic bowel   Plan:  This patient would benefit from acute inpatient rehab to address functional mobility, ADL's/adaptive equipment and transition to home. Additionally, the patient requires daily MD oversight of the active medical issues noted above. Projected goals would be mod I to supervision  with mobility and self-care with an ELOS of 8-11 days.  Dispo and social supports are appropriate.    Rehab Admissions Coordinator to follow up.    Rawland Caddy, MD, [facility] Physical Medicine & Rehabilitation Medical Director Rehabilitation Services 04/07/2024

## 2024-04-07 NOTE — Progress Notes (Signed)
 Neurosurgery Progress Note  History: Nathaniel Hobbs is s/p C3-5 ACDF. Currently 4 Days Post-Op.   POD4: Pt doing well without complaints this morning. Does report expected neck soreness POD 3: Drain Removed, continues to have stiffness and some urinary frequency without burning  POD2: States that he is feeling better.  Notices improvement in his arms and legs.  Otherwise feels quite stiff. POD1: Feels improved movement in his legs this morning and improved sensation of his arms. Overall feels pain is reasonably controlled on medications.   Physical Exam: Vitals:   04/07/24 0846 04/07/24 0853  BP: (!) 156/80 (!) 156/80  Pulse: 67   Resp: 16   Temp: 99.8 F (37.7 C)   SpO2: 93%     AA Ox3 CNI  Strength: Side Biceps Triceps Deltoid Interossei Grip Wrist Ext. Wrist Flex.  R 4+ 4+ 4+ 4 4 4+ 4+  L 4- 4 3 3 3 3 3     Side Iliopsoas Quads Hamstring PF DF EHL  R 5 5 5 5 5 5   L 4 4+ 4 4+ 5 5    Data: No results found.   Other tests/results: see results reviewed  Assessment/Plan:  Nathaniel Hobbs is a 63 y.o presenting with rapid neurologic decline found to have severe cervical stenosis s/p C3-5 ACDF   - Continue to mobilize - pain control, wean IV medications as able - DVT prophylaxis JP removed. Ok to remove dressing on 4/15 - PTOT  Anastacio Karvonen PA-C Department of Neurosurgery

## 2024-04-07 NOTE — Progress Notes (Signed)
 Inpatient Rehab Coordinator Note:  I spoke with pt's spouse, Boyd Buffalo, over the phone to discuss CIR recommendations and goals/expectations of CIR stay.  We reviewed 3 hrs/day of therapy, physician follow up, and average length of stay 2 weeks (dependent upon progress) with goals of supervision to mod I.  She states that she works [employer], but if needed she could take FMLA for 24/7 supervision.  They also have a daughter who could be with him until 3pm in the afternoon, leaving just 2 hours of unsupervised time if he was able to reach intermittent mod I level.  We reviewed insurance and she confirms Community education officer as Research scientist (medical).  I will start auth request for CIR today.    Loye Rumble, PT, DPT Admissions Coordinator [phone] 04/07/24  10:24 AM

## 2024-04-07 NOTE — Plan of Care (Signed)
  Problem: Education: Goal: Knowledge of General Education information will improve Description: Including pain rating scale, medication(s)/side effects and non-pharmacologic comfort measures Outcome: Progressing   Problem: Health Behavior/Discharge Planning: Goal: Ability to manage health-related needs will improve Outcome: Progressing   Problem: Clinical Measurements: Goal: Ability to maintain clinical measurements within normal limits will improve Outcome: Progressing Goal: Will remain free from infection Outcome: Progressing Goal: Diagnostic test results will improve Outcome: Progressing Goal: Respiratory complications will improve Outcome: Progressing Goal: Cardiovascular complication will be avoided Outcome: Progressing   Problem: Activity: Goal: Risk for activity intolerance will decrease Outcome: Progressing   Problem: Nutrition: Goal: Adequate nutrition will be maintained Outcome: Progressing   Problem: Safety: Goal: Ability to remain free from injury will improve Outcome: Progressing   Problem: Skin Integrity: Goal: Risk for impaired skin integrity will decrease Outcome: Progressing   Problem: Coping: Goal: Level of anxiety will decrease Outcome: Not Progressing   Problem: Elimination: Goal: Will not experience complications related to bowel motility Outcome: Not Progressing Goal: Will not experience complications related to urinary retention Outcome: Not Progressing   Problem: Pain Managment: Goal: General experience of comfort will improve and/or be controlled Outcome: Not Progressing

## 2024-04-08 ENCOUNTER — Encounter (HOSPITAL_COMMUNITY): Payer: Self-pay | Admitting: Physical Medicine and Rehabilitation

## 2024-04-08 ENCOUNTER — Inpatient Hospital Stay (HOSPITAL_COMMUNITY)
Admission: AD | Admit: 2024-04-08 | Discharge: 2024-04-23 | DRG: 560 | Disposition: A | Discharge status: Home / Self Care | Source: Intra-hospital | Attending: Physical Medicine and Rehabilitation | Admitting: Physical Medicine and Rehabilitation

## 2024-04-08 DIAGNOSIS — G8250 Quadriplegia, unspecified: Secondary | ICD-10-CM | POA: Diagnosis not present

## 2024-04-08 DIAGNOSIS — I25810 Atherosclerosis of coronary artery bypass graft(s) without angina pectoris: Secondary | ICD-10-CM

## 2024-04-08 DIAGNOSIS — N319 Neuromuscular dysfunction of bladder, unspecified: Secondary | ICD-10-CM | POA: Diagnosis present

## 2024-04-08 DIAGNOSIS — Z981 Arthrodesis status: Secondary | ICD-10-CM | POA: Diagnosis not present

## 2024-04-08 DIAGNOSIS — Z9861 Coronary angioplasty status: Secondary | ICD-10-CM | POA: Diagnosis not present

## 2024-04-08 DIAGNOSIS — R001 Bradycardia, unspecified: Secondary | ICD-10-CM | POA: Diagnosis not present

## 2024-04-08 DIAGNOSIS — R252 Cramp and spasm: Secondary | ICD-10-CM | POA: Diagnosis present

## 2024-04-08 DIAGNOSIS — K592 Neurogenic bowel, not elsewhere classified: Secondary | ICD-10-CM | POA: Diagnosis present

## 2024-04-08 DIAGNOSIS — R7303 Prediabetes: Secondary | ICD-10-CM | POA: Diagnosis present

## 2024-04-08 DIAGNOSIS — R609 Edema, unspecified: Secondary | ICD-10-CM | POA: Insufficient documentation

## 2024-04-08 DIAGNOSIS — K5900 Constipation, unspecified: Secondary | ICD-10-CM | POA: Diagnosis present

## 2024-04-08 DIAGNOSIS — I10 Essential (primary) hypertension: Secondary | ICD-10-CM | POA: Diagnosis present

## 2024-04-08 DIAGNOSIS — E7800 Pure hypercholesterolemia, unspecified: Secondary | ICD-10-CM | POA: Diagnosis present

## 2024-04-08 DIAGNOSIS — Z7982 Long term (current) use of aspirin: Secondary | ICD-10-CM | POA: Diagnosis not present

## 2024-04-08 DIAGNOSIS — M1712 Unilateral primary osteoarthritis, left knee: Secondary | ICD-10-CM | POA: Diagnosis present

## 2024-04-08 DIAGNOSIS — G992 Myelopathy in diseases classified elsewhere: Secondary | ICD-10-CM | POA: Diagnosis present

## 2024-04-08 DIAGNOSIS — M4802 Spinal stenosis, cervical region: Secondary | ICD-10-CM | POA: Diagnosis present

## 2024-04-08 DIAGNOSIS — E669 Obesity, unspecified: Secondary | ICD-10-CM | POA: Diagnosis present

## 2024-04-08 DIAGNOSIS — G959 Disease of spinal cord, unspecified: Principal | ICD-10-CM

## 2024-04-08 DIAGNOSIS — Z4789 Encounter for other orthopedic aftercare: Principal | ICD-10-CM

## 2024-04-08 DIAGNOSIS — Z6834 Body mass index (BMI) 34.0-34.9, adult: Secondary | ICD-10-CM

## 2024-04-08 DIAGNOSIS — I2510 Atherosclerotic heart disease of native coronary artery without angina pectoris: Secondary | ICD-10-CM | POA: Diagnosis present

## 2024-04-08 DIAGNOSIS — Z79899 Other long term (current) drug therapy: Secondary | ICD-10-CM

## 2024-04-08 DIAGNOSIS — R531 Weakness: Secondary | ICD-10-CM | POA: Diagnosis not present

## 2024-04-08 MED ORDER — ASPIRIN 81 MG PO TBEC
81.0000 mg | DELAYED_RELEASE_TABLET | Freq: Every day | ORAL | Status: DC
Start: 2024-04-09 — End: 2024-04-23
  Administered 2024-04-09 – 2024-04-23 (×15): 81 mg via ORAL
  Filled 2024-04-08 (×15): qty 1

## 2024-04-08 MED ORDER — METHOCARBAMOL 500 MG PO TABS: 500.0000 mg | ORAL_TABLET | Freq: Four times a day (QID) | ORAL | Status: DC | PRN

## 2024-04-08 MED ORDER — GABAPENTIN 400 MG PO CAPS
400.0000 mg | ORAL_CAPSULE | Freq: Every day | ORAL | Status: DC
  Administered 2024-04-09 – 2024-04-23 (×15): 400 mg via ORAL
  Filled 2024-04-08 (×15): qty 1

## 2024-04-08 MED ORDER — POLYETHYLENE GLYCOL 3350 17 G PO PACK
17.0000 g | PACK | Freq: Two times a day (BID) | ORAL | Status: DC
  Administered 2024-04-08 – 2024-04-11 (×3): 17 g via ORAL
  Filled 2024-04-08 (×6): qty 1

## 2024-04-08 MED ORDER — POLYETHYLENE GLYCOL 3350 17 G PO PACK: 17.0000 g | PACK | Freq: Every day | ORAL | Status: DC | PRN

## 2024-04-08 MED ORDER — ACETAMINOPHEN 650 MG RE SUPP: 650.0000 mg | Freq: Four times a day (QID) | RECTAL | Status: DC | PRN

## 2024-04-08 MED ORDER — LACTULOSE 10 GM/15ML PO SOLN: 30.0000 g | Freq: Every day | ORAL | Status: DC | PRN

## 2024-04-08 MED ORDER — ENOXAPARIN SODIUM 40 MG/0.4ML IJ SOSY: 40.0000 mg | PREFILLED_SYRINGE | INTRAMUSCULAR | Status: DC

## 2024-04-08 MED ORDER — ACETAMINOPHEN 325 MG PO TABS: 650.0000 mg | ORAL_TABLET | Freq: Four times a day (QID) | ORAL | Status: AC | PRN

## 2024-04-08 MED ORDER — OXYCODONE HCL 5 MG PO TABS
10.0000 mg | ORAL_TABLET | ORAL | Status: DC | PRN
  Administered 2024-04-08 – 2024-04-16 (×16): 10 mg via ORAL
  Filled 2024-04-08 (×18): qty 2

## 2024-04-08 MED ORDER — BISACODYL 10 MG RE SUPP: 10.0000 mg | Freq: Every day | RECTAL | Status: DC | PRN

## 2024-04-08 MED ORDER — ASPIRIN 81 MG PO TBEC: 81.0000 mg | DELAYED_RELEASE_TABLET | Freq: Every day | ORAL | Status: DC

## 2024-04-08 MED ORDER — SENNA 8.6 MG PO TABS: 2.0000 | ORAL_TABLET | Freq: Every day | ORAL | Status: AC

## 2024-04-08 MED ORDER — OXYCODONE HCL 10 MG PO TABS: 10.0000 mg | ORAL_TABLET | Freq: Four times a day (QID) | ORAL | 0 refills | Status: DC | PRN

## 2024-04-08 MED ORDER — OXYCODONE HCL 5 MG PO TABS
10.0000 mg | ORAL_TABLET | Freq: Four times a day (QID) | ORAL | Status: DC | PRN
  Administered 2024-04-08: 10 mg via ORAL
  Filled 2024-04-08: qty 2

## 2024-04-08 MED ORDER — BACLOFEN 5 MG HALF TABLET
5.0000 mg | ORAL_TABLET | Freq: Three times a day (TID) | ORAL | Status: DC
Start: 2024-04-08 — End: 2024-04-12
  Administered 2024-04-08 – 2024-04-12 (×11): 5 mg via ORAL
  Filled 2024-04-08 (×11): qty 1

## 2024-04-08 MED ORDER — HYDROMORPHONE HCL 1 MG/ML IJ SOLN
0.5000 mg | INTRAMUSCULAR | Status: DC | PRN
  Administered 2024-04-08: 0.5 mg via INTRAVENOUS
  Filled 2024-04-08: qty 0.5

## 2024-04-08 MED ORDER — LACTULOSE 10 GM/15ML PO SOLN
30.0000 g | Freq: Every day | ORAL | Status: DC | PRN
  Filled 2024-04-08: qty 45

## 2024-04-08 MED ORDER — METHOCARBAMOL 500 MG PO TABS
500.0000 mg | ORAL_TABLET | Freq: Four times a day (QID) | ORAL | Status: DC | PRN
  Administered 2024-04-08 – 2024-04-23 (×13): 500 mg via ORAL
  Filled 2024-04-08 (×14): qty 1

## 2024-04-08 MED ORDER — ACETAMINOPHEN 325 MG PO TABS
650.0000 mg | ORAL_TABLET | Freq: Four times a day (QID) | ORAL | Status: DC | PRN
  Administered 2024-04-11 – 2024-04-17 (×6): 650 mg via ORAL
  Filled 2024-04-08 (×7): qty 2

## 2024-04-08 MED ORDER — ENALAPRIL MALEATE 10 MG PO TABS
10.0000 mg | ORAL_TABLET | Freq: Two times a day (BID) | ORAL | Status: DC
  Administered 2024-04-08 – 2024-04-23 (×30): 10 mg via ORAL
  Filled 2024-04-08 (×30): qty 1

## 2024-04-08 MED ORDER — ONDANSETRON HCL 4 MG PO TABS: 4.0000 mg | ORAL_TABLET | Freq: Four times a day (QID) | ORAL | Status: DC | PRN

## 2024-04-08 MED ORDER — ENOXAPARIN SODIUM 40 MG/0.4ML IJ SOSY: PREFILLED_SYRINGE | INTRAMUSCULAR | Status: DC

## 2024-04-08 MED ORDER — ONDANSETRON HCL 4 MG/2ML IJ SOLN: 4.0000 mg | Freq: Four times a day (QID) | INTRAMUSCULAR | Status: DC | PRN

## 2024-04-08 MED ORDER — ENOXAPARIN SODIUM 40 MG/0.4ML IJ SOSY
40.0000 mg | PREFILLED_SYRINGE | INTRAMUSCULAR | Status: DC
  Administered 2024-04-08 – 2024-04-18 (×11): 40 mg via SUBCUTANEOUS
  Filled 2024-04-08 (×13): qty 0.4

## 2024-04-08 MED ORDER — BISACODYL 10 MG RE SUPP: 10.0000 mg | Freq: Every day | RECTAL | 0 refills | Status: DC | PRN

## 2024-04-08 MED ORDER — SENNA 8.6 MG PO TABS
2.0000 | ORAL_TABLET | Freq: Every day | ORAL | Status: DC
  Administered 2024-04-08 – 2024-04-12 (×5): 17.2 mg via ORAL
  Filled 2024-04-08 (×5): qty 2

## 2024-04-08 MED ORDER — POLYETHYLENE GLYCOL 3350 17 G PO PACK: 17.0000 g | PACK | Freq: Every day | ORAL | Status: DC

## 2024-04-08 MED ORDER — ATORVASTATIN CALCIUM 80 MG PO TABS
80.0000 mg | ORAL_TABLET | Freq: Every day | ORAL | Status: DC
  Administered 2024-04-08 – 2024-04-22 (×15): 80 mg via ORAL
  Filled 2024-04-08 (×15): qty 1

## 2024-04-08 NOTE — Discharge Summary (Signed)
 Physician Discharge Summary  Patient ID: Nathaniel Hobbs MRN: [id number] DOB/AGE: 61-16-1962 61 y.o.  Admit date: 04/08/2024 Discharge date: 04/23/2024  Discharge Diagnoses:  Principal Problem:   Cervical myelopathy [facility]) DVT prophylaxis Pain management Constipation versus neurogenic bowel Hypertension CAD Hyperlipidemia Obesity Possible neurogenic bladder  Discharged Condition: Stable  Significant Diagnostic Studies: DG Abd 2 Views Result Date: 04/11/2024 CLINICAL DATA:  Constipation for several days EXAM: ABDOMEN - 2 VIEW COMPARISON:  None Available. FINDINGS: Nonobstructed bowel-gas pattern. Moderate stool in the right colon. There is no evidence of free air. Bilateral hip arthroplasties. IMPRESSION: 1. Nonobstructed bowel-gas pattern. Moderate stool in the right colon. Electronically Signed   By: Rozell Cornet M.D.   On: 04/11/2024 22:06   DG Cervical Spine 2-3 Views Result Date: 04/03/2024 CLINICAL DATA:  Cervical myelopathy, intraoperative exam EXAM: CERVICAL SPINE - 2-3 VIEW COMPARISON:  04/03/2024 FINDINGS: Four fluoroscopic images are obtained during the performance of the procedure and are submitted for interpretation only. Initial image demonstrates instrument overlying the C4 vertebral body. Subsequent images demonstrate ACDF at C3-4 and C4-5. Alignment is anatomic. Please refer to the operative report. Fluoroscopy time: 7 seconds, 1.4534 mGy IMPRESSION: 1. Intraoperative exam as above.  Please refer to operative report. Electronically Signed   By: Bobbye Burrow M.D.   On: 04/03/2024 23:38   DG C-Arm 1-60 Min-No Report Result Date: 04/03/2024 Fluoroscopy was utilized by the requesting physician.  No radiographic interpretation.   DG C-Arm 1-60 Min-No Report Result Date: 04/03/2024 Fluoroscopy was utilized by the requesting physician.  No radiographic interpretation.   DG C-Arm 1-60 Min-No Report Result Date: 04/03/2024 Fluoroscopy was utilized by the requesting  physician.  No radiographic interpretation.   MR LUMBAR SPINE WO CONTRAST Result Date: 04/03/2024 CLINICAL DATA:  Lower back pain, fall in November 2024, weakness of left leg. EXAM: MRI LUMBAR SPINE WITHOUT CONTRAST TECHNIQUE: Multiplanar, multisequence MR imaging of the lumbar spine was performed. No intravenous contrast was administered. COMPARISON:  Same day MRI cervical spine and MRI head. FINDINGS: Segmentation:  Standard. Alignment: Lumbar lordosis is maintained. No significant listhesis. Vertebrae: Slight irregularity of the L2 superior endplate with discogenic edema along the right aspect of the superior endplate without significant height loss, most likely related to chronic degenerative changes. Schmorl's nodes at multiple levels. No suspicious osseous lesion. Conus medullaris and cauda equina: Conus extends to the L1 level. Conus and cauda equina appear normal. Paraspinal and other soft tissues: Mild atrophy of the paraspinal musculature. The paraspinal soft tissues are otherwise unremarkable. Disc levels: T12-L1: Minimal disc bulge. Left foraminal disc protrusion. Mild-to-moderate facet arthrosis. No significant spinal canal stenosis. No significant foraminal stenosis. L1-2: Diffuse disc bulge which indents the ventral thecal sac. Moderate facet arthrosis. There is additional mild prominence of the dorsal epidural fat. Associated mild-to-moderate spinal canal stenosis with slight crowding of the cauda equina nerve roots. No significant foraminal stenosis. L2-3: Diffuse disc bulge which indents the ventral thecal sac. Moderate facet arthrosis. Mild prominence of the dorsal epidural fat. There is severe effacement of the thecal sac with crowding of the cauda equina nerve roots and complete effacement of CSF at this level within the thecal sac. Mild bilateral foraminal stenosis. L3-4: Diffuse disc bulge which indents the ventral thecal sac. Prominence of epidural fat surrounding the thecal sac. Moderate  facet arthrosis. Severe effacement of the thecal sac at this level with complete effacement of CSF. Moderate right and mild left foraminal stenosis. L4-5: Diffuse disc bulge. Moderate facet arthrosis. Prominence of the epidural  fat. Severe effacement of the thecal sac with crowding of the cauda equina nerve roots and complete effacement of CSF. Moderate to severe right and mild left foraminal stenosis. Disc bulge and facet osteophytes likely contact the exiting right L4 nerve root. L5-S1: Diffuse disc bulge. Central annular fissure. Prominence of the epidural fat. Mild to moderate effacement of the thecal sac. Moderate facet arthrosis. Mild foraminal stenosis on the right. IMPRESSION: Mild irregularity of the L2 superior endplate with adjacent edema likely reflecting discogenic edema in the setting of degenerative changes. No height loss. Recommend correlation with history of trauma and tenderness at this level. Degenerative changes of the lumbar spine as above along with prominence of the epidural fat at multiple levels. There is severe effacement of the thecal sac at L2-3 through L4-5 as above. Multilevel foraminal stenosis, greatest and moderate to severe on the right at L4-5. Disc bulge and facet osteophytes at L4-5 contact the exiting right L4 nerve root. Small central annular fissure at L5-S1. Electronically Signed   By: Denny Flack M.D.   On: 04/03/2024 15:22   MR Cervical Spine Wo Contrast Result Date: 04/03/2024 CLINICAL DATA:  Cervical radiculopathy, fall in November 2024, weakness. EXAM: MRI CERVICAL SPINE WITHOUT CONTRAST TECHNIQUE: Multiplanar, multisequence MR imaging of the cervical spine was performed. No intravenous contrast was administered. COMPARISON:  Same day MRI head. FINDINGS: Alignment: Cervical lordosis is maintained. Trace retrolisthesis of C4 on C5. Vertebrae: Degenerative endplate changes at multiple levels. There is mild discogenic edema at C3-4. Mild discogenic height loss of the  C4 and C5 vertebral bodies. No evidence of acute fracture. Cord: There is cord compression at the C3-4 and C4-5 levels with diminished caliber of the cord also noted at the C5-6 level. Increased signal within the cervical cord at C3-4 and C4-5 most likely reflecting myelomalacia. Hemangioma in the T2 vertebral body. Posterior Fossa, vertebral arteries, paraspinal tissues: The visualized posterior fossa structures are unremarkable. See same day MRI brain for complete evaluation intracranial findings. Prevertebral edema from C2-C4 likely related to prominent anterior endplate osteophytes. No significant edema within the paraspinal musculature posteriorly. No evidence of ligamentous injury. Disc levels: C2-3: Disc osteophyte complex slightly eccentric to the left which indents the ventral thecal sac without contacting the spinal cord. Uncovertebral hypertrophy more pronounced on the left. Bilateral facet arthrosis. Mild right and moderate left foraminal stenosis. C3-4: Mild disc height loss. Large disc osteophyte complex which compresses the ventral cervical cord. Mild thickening of the ligamentum flavum which indents the dorsal thecal sac. Severe spinal canal stenosis and cord compression with cord signal abnormality at this level. Bilateral facet arthrosis and uncovertebral hypertrophy. Severe bilateral foraminal stenosis. C4-5: Mild disc height loss. Large disc osteophyte complex eccentric to the right which compresses the ventral cervical cord. Thickening of the ligamentum flavum which indents the dorsal thecal sac. Severe spinal canal stenosis and cord compression with cord signal abnormality at this level. Uncovertebral hypertrophy more pronounced on the right. Bilateral facet arthrosis. Severe right and moderate to severe left foraminal stenosis. C5-6: Disc osteophyte complex and right subarticular disc protrusion which indents the ventral thecal sac. There is possible subtle flattening of the ventral cervical  cord. Decreased cord caliber at this level with subtle cord signal abnormality without definite cord compression. Uncovertebral hypertrophy and facet arthrosis. Severe bilateral foraminal stenosis. C6-7: Disc osteophyte complex and left paracentral disc protrusion which indents the ventral thecal sac without contacting the spinal cord. Bilateral facet arthrosis. Uncovertebral hypertrophy more pronounced on the left. Mild-to-moderate  right and moderate left foraminal stenosis. C7-T1: Small central disc protrusion. No significant spinal canal stenosis. Bilateral facet arthrosis. No significant foraminal stenosis. IMPRESSION: Degenerative changes of the cervical spine as above. Severe spinal canal stenosis at C3-4 and C4-5 with associated cord compression. Cord signal abnormality at C3-4 and C4-5 along with cord volume loss suggestive of myelomalacia. Multilevel foraminal stenosis, greatest and severe bilaterally at C3-4, on the right at C4-5, and bilaterally at C5-6. Additional moderate to severe foraminal stenosis on the left at C4-5. Mild prevertebral edema from C2-C4 is likely related to prominent anterior endplate osteophytes. Mild discogenic edema at C3-4 which may contribute to neck pain. Electronically Signed   By: Denny Flack M.D.   On: 04/03/2024 15:13   MR BRAIN WO CONTRAST Result Date: 04/03/2024 CLINICAL DATA:  Paresthesias, numbness or tingling for 1 month in the left leg and associated weakness. Carpal tunnel like symptoms of both upper extremities. EXAM: MRI HEAD WITHOUT CONTRAST TECHNIQUE: Multiplanar, multiecho pulse sequences of the brain and surrounding structures were obtained without intravenous contrast. COMPARISON:  None Available. FINDINGS: Brain: No acute infarct. No evidence of intracranial hemorrhage. Nonspecific scattered T2/FLAIR hyperintensity in the periventricular and subcortical white matter. Possible small remote infarct in the left cerebellum. No mass lesion or midline shift.  Normal appearance of midline structures. The basilar cisterns are patent. No extra-axial fluid collections. Ventricles: Normal size and configuration of the ventricles. Vascular: Skull base flow voids are visualized. 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 Skull and upper cervical spine: Degenerative changes in the visualized upper cervical spine. There is suggestion of a large disc bulge at C3-4 better evaluated on same day MRI cervical spine. Visualized calvarium is unremarkable. Sinuses/Orbits: Orbits are symmetric. Paranasal sinuses are clear. Other: Mastoid air cells are clear. IMPRESSION: No acute intracranial abnormality. Nonspecific white matter signal abnormality which may reflect mild chronic microvascular ischemic changes. Additional considerations include sequelae of migraine headaches, prior infection/inflammatory process, or demyelination. 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 Electronically Signed   By: Denny Flack M.D.   On: 04/03/2024 15:00    Labs:  Basic Metabolic Panel: Recent Labs  Lab 04/17/24 0623  NA 140  K 3.8  CL 107  CO2 25  GLUCOSE 105*  BUN 8  CREATININE 0.73  CALCIUM  8.7*    CBC: Recent Labs  Lab 04/17/24 0623  WBC 6.6  NEUTROABS 4.2  HGB 12.9*  HCT 38.0*  MCV 89.8  PLT 231    CBG: No results for input(s): "GLUCAP" in the last 168 hours.  Brief HPI:   Nathaniel Hobbs is a 63 y.o. right-handed male with history significant for hypertension hyperlipidemia, CAD status post PCI 2009, 2013 followed by Dr. Beau Bound maintained on low-dose aspirin .  Per chart review lives with spouse independent prior to admission working full-time.  [facility] experiencing back pain  with difficulty getting up from a kneeling position for several months however the last few weeks he has noticed a significant worsening as well as paresthesia of bilateral upper extremities and significant weakness of his left arm and leg.  MRI of the brain showed no acute changes small remote infarct left cerebellum.  MRI cervical spine imaging revealed severe spinal canal stenosis/myelopathy of C3-4 and C4-5 with associated cord compression.  Cord signal abnormality at C3-4  and C4-5 along with cord volume loss suggestive of myelomalacia.  Multilevel foraminal stenosis, greatest and severe bilateral at C3-4 on the right and C4-5 and bilaterally at C5-6.  Additional moderate to severe foraminal stenosis on the left at C4-5.  Underwent anterior cervical discectomy and fusion at C3-4 and 4-5.  Anterior cervical instrumentation at C3-5 placement of structural allograft 04/03/2024 per Dr. Jeris Montes.  JP drain removed 04/07/2024.  He was cleared to begin Lovenox  for DVT prophylaxis 04/06/2024 as well as pain management with the use of Neurontin  with oxycodone  for breakthrough pain.  Patient feels like he is getting stronger since surgery.  He had reported some issues of chronic issues of constipation using the Linzess over-the-counter.  Reports he had been continent of bowel and bladder overall.  Therapy evaluations completed due to patient decreased functional mobility was admitted for a comprehensive rehab program   Hospital Course: Nathaniel Hobbs was admitted to rehab 04/08/2024 for inpatient therapies to consist of PT, ST and OT at least three hours five days a week. Past admission physiatrist, therapy team and rehab RN have worked together to provide customized collaborative inpatient rehab.  Pertaining to patient's cervical stenosis/myelopathy status post ACDF C3-5 04/03/2024.  Follow-up neurosurgery.  Surgical site clean and dry.  Lovenox  for DVT prophylaxis.  Maintained on low-dose aspirin  for history of CAD with  PCI followed by cardiology services.  Pain managed with use of Neurontin /Voltaren  gel with Robaxin  and oxycodone  as needed.  Baclofen  was added for spasticity and titrated as needed.  Blood pressure controlled on Vasotec  would need outpatient follow-up.  Hyperlipidemia Lipitor  as indicated.  Questionable neurogenic bowel and bladder check PVR scans bowel program regulated.  Obesity BMI 34.18 dietary follow-up.   Blood pressures were monitored on TID basis and remained controlled and monitored     Rehab course: During patient's stay in rehab weekly team conferences were held to monitor patient's progress, set goals and discuss barriers to discharge. At admission, patient required minimal assist 45 feet rolling walker min mod assist sit to stand  Physical exam.  Blood pressure 137/76 pulse 59 temperature 99 respirations 18 oxygen saturation 95% room air Constitutional.  No acute distress HEENT Head.  Normocephalic and atraumatic Eyes.  Pupils round and reactive to light no discharge without nystagmus Neck.  Supple nontender no JVD without thyromegaly Cardiac regular rate and rhythm without any extra sounds or murmur heard Abdomen.  Soft nontender positive bowel sounds without rebound Respiratory effort normal no respiratory distress without wheeze Neurologic.  Alert and oriented x 3.  Cranial nerves II through XII intact Motor.  Right upper extremity 4/5 deltoid 4/5 bicep 4/5 tricep 4 -/5 grip Left upper extremity 3/5 deltoid 4 -/5 bicep 4 -/5 tricep 3/5 grip Right lower extremity hip flexors 4 -/5 knee extension 4/5 ankle dorsiflexion 4 -/5 APF 4 -/5 Left lower extremity hip flexors 3/5 knee extension 4 -/5 ADF 4 -/5 APF 4 -/5  He/She  has had improvement in activity tolerance, balance, postural control as well as ability to compensate for deficits. He/She has had improvement in functional use RUE/LUE  and RLE/LLE as well as improvement in awareness.  Patient ambulates to the main gym 220  feet rolling walker contact-guard.  Participated in dynamic gait and balance training without assistive device.  He ambulates to his room therapy gym with standby assist rolling walker working with energy conservation techniques.  Instructed in toileting simulation activities such as managing ball around waist managing ball from hand to hand  with eyes closed for increased bilateral upper extremity sensitivity for light touch.  Upper body dressing patient donned a new shirt with light minimal assist to pull shirt and down the back.  Grooming he stood at sink for oral care and donned body spray with supervision.  Lower body dressing donned pants with minimal assist to thread and pull to waistline.  Bathing completed TTD and walk-in shower with supervision.  Transfers ambulatory ADL transfers rolling walker contact-guard.  Full family teaching completed plan discharge to home.       Disposition:  There are no questions and answers to display.         Diet: Regular  Special Instructions: No driving smoking or alcohol  Medications at discharge. 1.  Tylenol  as needed 2.  Aspirin  81 mg p.o. daily 3.  Lipitor  80 mg p.o. daily 4.  Baclofen  15 mg p.o. 3 times daily  5.  Vasotec  10 mg p.o. twice daily 6.  Neurontin  400 mg daily 7.  Robaxin  500 mg p.o. every 6 hours as needed muscle spasms 8.  Hydrocodone  5-325 mg 1 tablet every 4 hours as needed pain 9.  MiraLAX  daily as needed hold for loose stools 10.  Senna 2 tablets daily 11.  Voltaren  gel 4 g 4 times daily    30-35 minutes were spent completing discharge summary and discharge planning Discharge Instructions     Ambulatory referral to Physical Medicine Rehab   Complete by: As directed    Moderate complexity follow up 1-2 weeks cervical myelopathy        Follow-up Information     Lovorn, Jacqlyn Matas, MD Follow up.   Specialty: Physical Medicine and Rehabilitation Why: Office to call for appointment Contact information: [address] [place] [address] [phone]         Jodeen Munch, MD Follow up.   Specialty: Neurosurgery Why: call for appointment Contact information: [address] [place] [address] [phone]                 Signed: Sterling Eisenmenger 04/21/2024, 5:34 AM

## 2024-04-08 NOTE — H&P (Addendum)
 Physical Medicine and Rehabilitation Admission H&P    Chief Complaint  Patient presents with   Back Pain   Groin Pain   : HPI: Nathaniel Hobbs is a 63 year old right handed male with history significant for hypertension, hyperlipidemia, CAD status post PCI 2009, 2013 followed by Dr. Burney Carter maintained on low-dose aspirin, prediabetes.  Per chart review patient lives with spouse.  1 level home one-step to entry.  Independent prior to admission and working full-time.  Presented to [facility] 04/03/2024 experiencing back pain with difficulty getting up from a kneeling position for several months however over the last few weeks he has noticed a significant worsening as well as paresthesia of his bilateral upper extremities and significant weakness of his left arm and left leg.  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  Cord signal abnormality at C3-4 and C4-5 along with cord volume loss suggestive of myelomalacia.  Multilevel foraminal stenosis, greatest and severe bilaterally at C3-4, on the right at C4-5 and bilaterally at C5-6.  Additional moderate to severe foraminal stenosis on the left at C4-5.  Underwent anterior cervical discectomy and fusion at C3/4 and C4-5.  Anterior cervical instrumentation at C3-5 placement of structural allograft 04/03/2024 per Dr. Jodeen Munch.  JP drain removed 04/07/2024.  He was cleared to begin Lovenox for DVT prophylaxis 04/06/2024 pain management ongoing with the addition of scheduled Neurontin with oxycodone for breakthrough pain.  Patient feels like he is getting little stronger since her surgery.  Patient reports he has been having chronic issues with constipation, using his wife's Linzess or over-the-counter laxatives.  Reports he has been continent of bowel and bladder  overall.  He did have incontinent episode when he was given a lot of laxatives and had a lot of diarrhea.  Therapy evaluations completed due to patient's decreased functional mobility was admitted for a comprehensive rehab program  Review of Systems  Constitutional:  Negative for chills and fever.  HENT:  Negative for hearing loss.   Eyes:  Negative for blurred vision and double vision.  Respiratory:  Negative for cough, shortness of breath (had breif episode yesterday that resolved) and wheezing.   Cardiovascular:  Negative for chest pain, palpitations and leg swelling.  Gastrointestinal:  Positive for constipation and diarrhea (Had diarrhea due to laxatives). Negative for heartburn, nausea and vomiting.  Genitourinary:  Positive for urgency. Negative for dysuria, flank pain and hematuria.  Musculoskeletal:  Positive for joint pain and myalgias.  Skin:  Negative for rash.  Neurological:  Positive for sensory change and weakness.  All other systems reviewed and are negative.  Past Medical History:  Diagnosis Date   High cholesterol    Hypertension    Past Surgical History:  Procedure Laterality Date   ANTERIOR CERVICAL DECOMP/DISCECTOMY FUSION N/A 04/03/2024   Procedure: ANTERIOR CERVICAL DECOMPRESSION/DISCECTOMY FUSION 2 LEVELS;  Surgeon: Jodeen Munch, MD;  Location: [facility] ORS;  Service: Neurosurgery;  Laterality: N/A;   No family history on file. Social History:  reports that he has never smoked. He has never used smokeless tobacco. He reports that he does not drink alcohol and does not use drugs. Allergies: No Known Allergies Medications Prior to Admission  Medication Sig Dispense Refill   acetaminophen (TYLENOL) 325 MG tablet Take 2 tablets (650 mg total) by mouth every 6 (six) hours as needed for  mild pain (pain score 1-3) (or Fever >/= 101).     [START ON 04/09/2024] aspirin EC 81 MG tablet Take 1 tablet (81 mg total) by mouth daily. Swallow whole.     atorvastatin (LIPITOR)  80 MG tablet Take 80 mg by mouth daily.     bisacodyl (DULCOLAX) 10 MG suppository Place 1 suppository (10 mg total) rectally daily as needed for moderate constipation. 12 suppository 0   enalapril (VASOTEC) 10 MG tablet Take 1 tablet by mouth 2 (two) times daily.     enoxaparin (LOVENOX) 40 MG/0.4ML injection 40 mg subcutaneous injection daily until more ambulatory then can discontinue     gabapentin (NEURONTIN) 400 MG capsule Take 400 mg by mouth daily.     lactulose (CHRONULAC) 10 GM/15ML solution Take 45 mLs (30 g total) by mouth daily as needed for severe constipation.     methocarbamol (ROBAXIN) 500 MG tablet Take 1 tablet (500 mg total) by mouth every 6 (six) hours as needed for muscle spasms.     oxyCODONE 10 MG TABS Take 1 tablet (10 mg total) by mouth every 6 (six) hours as needed for up to 5 days for moderate pain (pain score 4-6) or severe pain (pain score 7-10). 20 tablet 0   polyethylene glycol (MIRALAX / GLYCOLAX) 17 g packet Take 17 g by mouth daily.     [START ON 04/09/2024] senna (SENOKOT) 8.6 MG TABS tablet Take 2 tablets (17.2 mg total) by mouth daily.        Home: Home Living Family/patient expects to be discharged to:: Private residence Living Arrangements: Spouse/significant other Available Help at Discharge: Family, Available 24 hours/day Type of Home: House Home Access: Stairs to enter Secretary/administrator of Steps: 1 Home Layout: One level Bathroom Shower/Tub: Tub/shower unit Home Equipment: Agricultural consultant (2 wheels), [employer] - single point   Functional History: Prior Function Prior Level of Function : Independent/Modified Independent Mobility Comments: difficulty completing basic tasks, however independent   Functional Status:  Mobility: Bed Mobility Overal bed mobility: Needs Assistance Bed Mobility: Rolling, Sidelying to Sit Rolling: +2 for physical assistance, +2 for safety/equipment, Min assist Sidelying to sit: Max assist, +2 for physical assistance,  +2 for safety/equipment General bed mobility comments: not tested, pt received & left sitting in recliner Transfers Overall transfer level: Needs assistance Equipment used: Rolling walker (2 wheels) Transfers: Sit to/from Stand Sit to Stand: Mod assist, Min assist General transfer comment: STS from recliner with cuing re: hand placement, extra time to scoot out to edge of seat & power up to standing. Assistance with hand placement with LUE on armrest. Ambulation/Gait Ambulation/Gait assistance: Min assist Gait Distance (Feet): 45 Feet (+ 75 ft) Assistive device: Rolling walker (2 wheels) Gait Pattern/deviations: Decreased step length - right, Decreased step length - left, Decreased dorsiflexion - right, Decreased dorsiflexion - left, Decreased stride length General Gait Details: decreased heel strike & dorsiflexion, decreased step length BLE, flexed posture throughout but pt able to self correct, requires brief standing breaks 2/2 back "tightening" up Gait velocity: decreased   ADL: ADL Overall ADL's : Needs assistance/impaired General ADL Comments: Total A to don B socks and pt stands to urinate with min A for standing balance and assistance to manage clothing.    Physical Exam: Blood pressure 137/76, pulse (!) 59, temperature 99.1 F (37.3 C), temperature source Oral, resp. rate 18, SpO2 95%.   General: No apparent distress HEENT: Head is normocephalic, atraumatic, sclera anicteric, oral mucosa pink and moist Neck: Supple without  JVD or lymphadenopathy Heart: Reg rate and rhythm. No murmurs rubs or gallops Chest: CTA bilaterally without wheezes, rales, or rhonchi; no distress Abdomen: Soft, non-tender, mildly-distended, bowel sounds positive-normoactive Extremities: 1+ edema LUE and b/l LE Psych: Pt's affect is appropriate. Pt is cooperative Skin: Surgical incision CDI Neuro:    Mental Status: AAOx3, memory intact, no apparent cognitive deficits noted Speech/Languate: Fluent,  follows simple commands CRANIAL NERVES: 2-12 grossly intact   MOTOR: RUE: 4/5 Deltoid, 4/5 Biceps, 4/5 Triceps, 4-/5 Grip LUE: 3/5 Deltoid, 4-/5 Biceps, 4-/5 Triceps, 3/5 Grip RLE: HF 4-/5, KE 4/5, ADF 4-/5, APF 4-/5 LLE: HF 3/5, KE 4-/5, ADF 4-/5, APF 4-/5   REFLEXES: Moderate hypertonia left upper extremity and left lower extremity.  Mild hypertonia right lower extremity  SENSORY: Normal to touch all 4 extremities  Coordination: Normal finger to nose and heel to shin, no tremor, no dysmetria  MSK: No joint tenderness/swelling noted.  Unable to fully extend bilateral knees, bilateral plantarflexion contractures   No results found for this or any previous visit (from the past 48 hours). No results found.    Blood pressure 137/76, pulse (!) 59, temperature 99.1 F (37.3 C), temperature source Oral, resp. rate 18, SpO2 95%.  Medical Problem List and Plan: 1. Functional deficits secondary to cervical stenosis/myelopathy status post ACDF C3-5 04/03/2024  -patient may shower, cover incision please  -ELOS/Goals: 14-16 days, PT/OT supervision/Mod I  -Admit to CIR  -PFRAOs b/l 2.  Antithrombotics: -DVT/anticoagulation:  Pharmaceutical: Lovenox.  Check vascular  -antiplatelet therapy: Aspirin 81 mg daily 3. Pain Management: Neurontin 400 mg daily, Robaxin 500 mg every 6 hours as needed, oxycodone  as needed 4. Mood/Behavior/Sleep: Provide emotional support  -antipsychotic agents: N/A 5. Neuropsych/cognition: This patient is capable of making decisions on his own behalf. 6. Skin/Wound Care: Routine skin checks 7. Fluids/Electrolytes/Nutrition: Routine in and outs with follow-up chemistries 8.  Constipation vs Neurogenic bowel   - Scheduled MiraLAX twice daily and continue Senokot.  Consider suppository bowel program if not having regular BMs 9.  Hypertension.  Vasotec 10 mg twice daily.  Monitor with increased mobility  -Controlled currently 10.  CAD status post PCI 2000 08/2012.   Follow-up with cardiology services Dr. Burney Carter. Denies CP 11.  Hyperlipidemia.  Lipitor 12.  Obesity.  BMI 34.18.  Dietary follow-up 13. Possible Neurogenic bladder  -PVR/bladder scans IC if greater than 350 14) Spasticity - Will start baclofen 5 mg 3 times daily  Daniel J Angiulli, PA-C 04/08/2024  I have personally performed a face to face diagnostic evaluation of this patient and formulated the key components of the plan.  Additionally, I have personally reviewed laboratory data, imaging studies, as well as relevant notes and concur with the physician assistant's documentation above.  The patient's status has not changed from the original H&P.  Any changes in documentation from the acute care chart have been noted above.  Lylia Sand, MD

## 2024-04-08 NOTE — Progress Notes (Signed)
 Inpatient Rehab Admissions Coordinator:    I have insurance approval and a bed available for pt to admit to CIR today. Dr. Clelia Current to assess and confirm if he can admit today.  TOC aware and I did speak with pt's spouse to update.  No answer on room phone or pt's cell and team aware pt can call me with any questions.    Loye Rumble, PT, DPT Admissions Coordinator [phone] 04/08/24  10:19 AM

## 2024-04-08 NOTE — TOC Transition Note (Signed)
 Transition of Care [facility]) - Discharge Note   Patient Details  Name: Nathaniel Hobbs MRN: [id number] Date of Birth: 1961/10/15  [facility] CM/SW Contact:  Alexandra Ice, RN Phone Number: 04/08/2024, 12:12 PM   Clinical Narrative:     Patient discharging today to CIR today. [facility] to provide transport. EMS completed and printed to nurses station        Patient Goals and CMS Choice            Discharge Placement                       Discharge Plan and Services Additional resources added to the After Visit Summary for                                       Social Drivers of Health (SDOH) Interventions SDOH Screenings   Food Insecurity: No Food Insecurity (04/04/2024)  Housing: Low Risk  (04/04/2024)  Transportation Needs: No Transportation Needs (04/04/2024)  Utilities: Not At Risk (04/04/2024)  Financial Resource Strain: Low Risk  (03/07/2024)   Received from [facility]  Tobacco Use: Low Risk  (04/03/2024)  Recent Concern: Tobacco Use - Medium Risk (03/07/2024)   Received from [facility]     Readmission Risk Interventions     No data to display

## 2024-04-08 NOTE — Progress Notes (Signed)
 Fanny Dance, MD  Physician Physical Medicine and Rehabilitation   PMR Pre-admission     Signed   Date of Service: 04/08/2024 10:35 AM  Related encounter: ED to Hosp-Admission (Discharged) from 04/03/2024 in [facility] (1A)   Signed     Expand All Collapse All  PMR Admission Coordinator Pre-Admission Assessment   Patient: Nathaniel Hobbs is an 63 y.o., male MRN: [id number] DOB: 11-17-1961 Height: 5\' 9"  (175.3 cm) Weight: 105 kg                                                                                                                                                  Insurance Information HMO: yes    PPO:      PCP:      IPA:      80/20:      OTHER:  PRIMARY: [employer] QHP      Policy#: [id number]      Subscriber: pt CM Name: faxed approval, name not provided      Phone#: [phone]     Fax#: [phone] Pre-Cert#:  [id number] auth for CIR via fax with updates due to fax listed above on 4/21      Employer:  Benefits:  Phone #: [phone]     Name:  Eff. Date: 12/26/23     Deduct: $7500 (met $342.89)      Out of Pocket Max: $9200 (met $345.37)      Life Max: n/a  CIR: 50%      SNF: 50% Outpatient: 50%     Co-Pay: 50% Home Health: 50%      Co-Pay: 50% DME: 50%     Co-Pay: 50% Providers:  SECONDARY:       Policy#:       Phone#:    Artist:       Phone#:    The Engineer, materials Information Summary" for patients in Inpatient Rehabilitation Facilities with attached "Privacy Act Statement-Health Care Records" was provided and verbally reviewed with: Patient and Family   Emergency Contact Information Contact Information       Name Relation Home Work Mobile    Troy D Spouse [phone]             Other Contacts   None on File      Current Medical History  Patient Admitting Diagnosis: cervical myelopathy   History of Present Illness: Pt is a 63 y/o male with PMH of HTN, CAD s/p PCI (09 and  13), DM, who presented to [facility] on 04/03/24 with worsening back pain and progressive weakness/paresthesias in all 4 extremities.  MRI spine showed degenerative changes with severe spinal stenosis and associated cord compression in the c-spine, as well as degenerative changes and severe effacement of the thecal sac in the lumbar spine.  Neurosurgery consulted and pt was taken  emergently to the OR for ACDF of C3-5.  Post op course pain management and bowel/bladder management.  Therapy evaluations completed and pt was recommended for CIR.   Glasgow Coma Scale Score: 15   Patient's medical record from [facility] has been reviewed by the rehabilitation admission coordinator and physician.   Past Medical History      Past Medical History:  Diagnosis Date   High cholesterol     Hypertension            Has the patient had major surgery during 100 days prior to admission? Yes   Family History  family history is not on file.     Current Medications   Current Medications    Current Facility-Administered Medications:    acetaminophen (TYLENOL) tablet 650 mg, 650 mg, Oral, Q6H PRN, 650 mg at 04/05/24 1445 **OR** acetaminophen (TYLENOL) suppository 650 mg, 650 mg, Rectal, Q6H PRN, Venetia Night, MD   atorvastatin (LIPITOR) tablet 80 mg, 80 mg, Oral, QHS, Venetia Night, MD, 80 mg at 04/06/24 2206   bisacodyl (DULCOLAX) suppository 10 mg, 10 mg, Rectal, Daily PRN, Renae Gloss, Richard, MD   enalapril (VASOTEC) tablet 10 mg, 10 mg, Oral, BID, Venetia Night, MD, 10 mg at 04/07/24 0853   enoxaparin (LOVENOX) injection 40 mg, 40 mg, Subcutaneous, Q24H, Wieting, Richard, MD, 40 mg at 04/06/24 1342   gabapentin (NEURONTIN) capsule 400 mg, 400 mg, Oral, Daily, Venetia Night, MD, 400 mg at 04/07/24 3016   HYDROmorphone (DILAUDID) injection 0.5 mg, 0.5 mg, Intravenous, Q3H PRN, Alford Highland, MD, 0.5 mg at 04/07/24 0855   lactulose (CHRONULAC) 10 GM/15ML solution 30 g, 30 g, Oral, Daily PRN,  Renae Gloss, Richard, MD, 30 g at 04/06/24 1342   methocarbamol (ROBAXIN) tablet 500 mg, 500 mg, Oral, Q6H PRN, Venetia Night, MD, 500 mg at 04/07/24 0852   ondansetron (ZOFRAN) tablet 4 mg, 4 mg, Oral, Q6H PRN **OR** ondansetron (ZOFRAN) injection 4 mg, 4 mg, Intravenous, Q6H PRN, Venetia Night, MD, 4 mg at 04/04/24 1819   Oral care mouth rinse, 15 mL, Mouth Rinse, PRN, Wieting, Richard, MD   oxyCODONE (Oxy IR/ROXICODONE) immediate release tablet 5 mg, 5 mg, Oral, Q6H PRN, Venetia Night, MD, 5 mg at 04/07/24 0603   polyethylene glycol (MIRALAX / GLYCOLAX) packet 17 g, 17 g, Oral, Daily PRN, Renae Gloss, Richard, MD   senna (SENOKOT) tablet 17.2 mg, 2 tablet, Oral, Daily, Wieting, Richard, MD, 17.2 mg at 04/06/24 0857   sodium chloride flush (NS) 0.9 % injection 3 mL, 3 mL, Intravenous, Q12H, Venetia Night, MD, 3 mL at 04/07/24 0109     Patients Current Diet:  Diet Order                  Diet regular Room service appropriate? Yes; Fluid consistency: Thin  Diet effective now                         Precautions / Restrictions Precautions Precautions: Fall, Cervical Precaution Booklet Issued: No Restrictions Weight Bearing Restrictions Per Provider Order: No    Has the patient had 2 or more falls or a fall with injury in the past year?Yes   Prior Activity Level Community (5-7x/wk): independent prior to admission, working FT, driving   Prior Functional Level Prior Function Prior Level of Function : Independent/Modified Independent Mobility Comments: difficulty completing basic tasks, however independent   Self Care: Did the patient need help bathing, dressing, using the toilet or eating?  Independent  Indoor Mobility: Did the patient need assistance with walking from room to room (with or without device)? Independent   Stairs: Did the patient need assistance with internal or external stairs (with or without device)? Independent   Functional Cognition: Did the  patient need help planning regular tasks such as shopping or remembering to take medications? Independent   Patient Information Are you of Hispanic, Latino/a,or Spanish origin?: A. No, not of Hispanic, Latino/a, or Spanish origin What is your race?: B. Black or African American Do you need or want an interpreter to communicate with a doctor or health care staff?: 0. No   Patient's Response To:  Health Literacy and Transportation Is the patient able to respond to health literacy and transportation needs?: Yes Health Literacy - How often do you need to have someone help you when you read instructions, pamphlets, or other written material from your doctor or pharmacy?: Never In the past 12 months, has lack of transportation kept you from medical appointments or from getting medications?: No In the past 12 months, has lack of transportation kept you from meetings, work, or from getting things needed for daily living?: No   Home Assistive Devices / Equipment Home Equipment: Agricultural consultant (2 wheels), [employer] - single point   Prior Device Use: Indicate devices/aids used by the patient prior to current illness, exacerbation or injury? None of the above   Current Functional Level Cognition   Orientation Level: Oriented X4    Extremity Assessment (includes Sensation/Coordination)   Upper Extremity Assessment: Generalized weakness (B UE numbness and pain radiating down from neck. FMC and strength deficits. Pt able to feed self but unable to open containers.)  Lower Extremity Assessment: Generalized weakness     ADLs   Overall ADL's : Needs assistance/impaired General ADL Comments: Total A to don B socks and pt stands to urinate with min A for standing balance and assistance to manage clothing.     Mobility   Overal bed mobility: Needs Assistance Bed Mobility: Rolling, Sidelying to Sit Rolling: +2 for physical assistance, +2 for safety/equipment, Min assist Sidelying to sit: Max assist, +2 for  physical assistance, +2 for safety/equipment     Transfers   Overall transfer level: Needs assistance Transfers: Sit to/from Stand Sit to Stand: Mod assist, +2 physical assistance, +2 safety/equipment     Ambulation / Gait / Stairs / Wheelchair Mobility   Ambulation/Gait Ambulation/Gait assistance: Min assist, +2 physical assistance, +2 safety/equipment Gait Distance (Feet): 30 Feet Assistive device: Rolling walker (2 wheels) Gait Pattern/deviations: Step-through pattern, Decreased stride length, Knee flexed in stance - right, Knee flexed in stance - left General Gait Details: assist for RW management and balance. B knees flexed throughout. After ~15', patient with trembling knees but lacks awareness of need to return to recliner in room Gait velocity: decreased     Posture / Balance Dynamic Sitting Balance Sitting balance - Comments: initially with posterior lean with inability to correct. Once feet on ground, patient with improved sitting balance Balance Overall balance assessment: Needs assistance Sitting-balance support: Feet supported, No upper extremity supported Sitting balance-Leahy Scale: Poor Sitting balance - Comments: initially with posterior lean with inability to correct. Once feet on ground, patient with improved sitting balance Standing balance support: Bilateral upper extremity supported, During functional activity Standing balance-Leahy Scale: Poor     Special needs/care consideration Skin surgical incision to anterior neck,  and Diabetic management yes        Previous Home Environment (from acute therapy documentation) Living  Arrangements: Spouse/significant other Available Help at Discharge: Family, Available 24 hours/day Type of Home: House Home Layout: One level Home Access: Stairs to enter Secretary/administrator of Steps: 1 Bathroom Shower/Tub: Tub/shower unit Home Care Services: No   Discharge Living Setting Plans for Discharge Living Setting:  Patient's home, Lives with (comment) (spouse and daughter) Type of Home at Discharge: House Discharge Home Layout: One level Discharge Home Access: Stairs to enter Entrance Stairs-Rails: None Entrance Stairs-Number of Steps: 1 onto deck Discharge Bathroom Shower/Tub: Tub/shower unit Discharge Bathroom Toilet: Standard Discharge Bathroom Accessibility: Yes How Accessible: Accessible via walker Does the patient have any problems obtaining your medications?: No   Social/Family/Support Systems Patient Roles: Spouse Anticipated Caregiver: spouse, Adriene Kachel Anticipated Caregiver's Contact Information: [phone] Ability/Limitations of Caregiver: works [employer] but can take FMLA if he needs 24/7 Caregiver Availability: 24/7 Discharge Plan Discussed with Primary Caregiver: Yes Is Caregiver In Agreement with Plan?: Yes     Goals Patient/Family Goal for Rehab: PT/OT supervision to mod I, SLP n/a Expected length of stay: 14-16 days Additional Information: Discharge plan: plan for intermittent mod I goals.  Spouse works [employer], and daughter leaves for work at [employer] (leaving ~2 hours where pt would be alone).  If patient is not safe to be alone for 2 hours, spouse can take FMLA to cover 24/7. Pt/Family Agrees to Admission and willing to participate: Yes Program Orientation Provided & Reviewed with Pt/Caregiver Including Roles  & Responsibilities: Yes     Decrease burden of Care through IP rehab admission: n/a     Possible need for SNF placement upon discharge: Not anticipated.  Plan to d/c to pt's home at intermittent mod I level.  If he is able to attain this, he will only be alone for about 2 hrs/day.  If he requires 24/7 supervision his spouse can take FMLA and provide supervision.      Patient Condition: This patient's condition remains as documented in the consult dated 04/07/24, in which the Rehabilitation Physician determined and documented that the patient's condition is appropriate  for intensive rehabilitative care in an inpatient rehabilitation facility. Will admit to inpatient rehab today.   Preadmission Screen Completed By:  Ladiamond Gallina E Lynnlee Revels, PT, DPT 04/07/2024 10:30 AM ______________________________________________________________________   Discussed status with Dr. Rayleen Cal on 04/08/24 at 10:34 AM  and received approval for admission today.   Admission Coordinator:  Khaila Velarde E Maxie Debose, PT, DPT time 10:34 AM Alanna Hu 04/08/24              Revision History

## 2024-04-08 NOTE — Plan of Care (Signed)

## 2024-04-08 NOTE — Progress Notes (Signed)
 Discharged to CIR with [facility] transporting patient by stretcher/ambulance. Patient left unit alert with no distress noted. Wife in room prior to leaving.

## 2024-04-08 NOTE — Progress Notes (Signed)
 Orthopedic Tech Progress Note Patient Details:  [name] [birth year] [id number]  Called in order to [facility] for BLE PRAFO BOOTS   Patient ID: Nathaniel Hobbs, male   DOB: [birth year], 63 y.o.   MRN: [id number]  Kermitt Pedlar 04/08/2024, 6:05 PM

## 2024-04-08 NOTE — Discharge Instructions (Addendum)
 Inpatient Rehab Discharge Instructions  Kohlten Manieri Discharge date and time: No discharge date for patient encounter.   Activities/Precautions/ Functional Status: Activity: activity as tolerated Diet: regular diet Wound Care: Routine skin checks Functional status:  ___ No restrictions     ___ Walk up steps independently ___ 24/7 supervision/assistance   ___ Walk up steps with assistance ___ Intermittent supervision/assistance  ___ Bathe/dress independently ___ Walk with walker     __x_ Bathe/dress with assistance ___ Walk Independently    ___ Shower independently ___ Walk with assistance    ___ Shower with assistance ___ No alcohol     ___ Return to work/school ________  Special Instructions: No driving smoking or alcohol  Continue to hold aspirin  81 mg daily until Eliquis completed 06/03/2024 and then resume    COMMUNITY REFERRALS UPON DISCHARGE:     Outpatient: PT               Agency: [facility]  [address] [facility] [address]  Phone: [phone]             Appointment Date/Time:WILL CALL TO SET UP FOLLOW UP APPOINTMENTS  Medical Equipment/Items Ordered:ROLLING WALKER HAS TUB BENCH AT HOME FROM FAMILY MEMBER                                                 Agency/Supplier:[facility] [phone]   My questions have been answered and I understand these instructions. I will adhere to these goals and the provided educational materials after my discharge from the hospital.  Patient/Caregiver Signature _______________________________ Date __________  Clinician Signature _______________________________________ Date __________  Please bring this form and your medication list with you to all your follow-up doctor's appointments.

## 2024-04-08 NOTE — Progress Notes (Signed)
 Inpatient Rehabilitation Admission Medication Review by a Pharmacist  A complete drug regimen review was completed for this patient to identify any potential clinically significant medication issues.  High Risk Drug Classes Is patient taking? Indication by Medication  Antipsychotic No   Anticoagulant Yes Lovenox: VTE ppx  Antibiotic No   Opioid {Receiving?:26196} ?***oxycodone?  Antiplatelet Yes Aspirin: CAD  Hypoglycemics/insulin No   Vasoactive Medication Yes Enalapril: HTN  Chemotherapy No   Other Yes Tylenol: pain Lipitor: HLD Bisacodyl, Miralax, Senna, Lactulose: bowel regimen Gabapentin: pain Robaxin: muscle spasms Zofran: N/V      Type of Medication Issue Identified Description of Issue Recommendation(s)  Drug Interaction(s) (clinically significant)     Duplicate Therapy     Allergy     No Medication Administration End Date     Incorrect Dose     Additional Drug Therapy Needed     Significant med changes from prior encounter (inform family/care partners about these prior to discharge). Meds discontinued: Celebrex, Flexeril, metaxolone, nabumetone Restart or discontinue as appropriate. Communicate medication changes with patient/family at discharge  Other       Clinically significant medication issues were identified that warrant physician communication and completion of prescribed/recommended actions by midnight of the next day:  No  Time spent performing this drug regimen review (minutes): 30  Thank you for allowing pharmacy to be a part of this patient's care.   Victoria Grass, PharmD 04/08/2024 1:52 PM  **Pharmacist phone directory can be found on [email or web address] listed under [facility]**

## 2024-04-08 NOTE — Discharge Summary (Signed)
 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 male with medical history significant of Hypertension, hyperlipidemid, CAD s/p PCI (2009, 2013) with DES to mLAD (2013), prediabetes, who presents to the ED due to back pain.   Experiencing back pain with difficulty getting up from a kneeling position for several months now, however over the last few weeks, he has noticed a significant worsening.  Then today, he knelt down onto the ground and was unable to stand up on his own and required 3 people to help him up.  During this time, he has noticed worsening paresthesia of his bilateral upper extremities.  He has noticed significant weakness of his left arm and left leg.  This is led to him having difficulty walking.  MRI of the brain with no acute intracranial abnormality.  MRI of the C-spine notable for degenerative changes with severe spinal stenosis with associated cord compression.  MRI of the L-spine with degenerative changes in addition to severe effacement of the thecal sac.  Neurosurgery was consulted with plan for the OR tonight.  [facility] contacted for admission.  Patient was taken urgently to the operating room by Dr. Myer Haff on 4/10 for cervical myelopathy and cervical stenosis with weakness.  Patient had an anterior cervical discectomy and fusion at  C3/4 and C4/5.  4/11.  Patient feeling okay.  Moving his  arms and legs a little bit better than what he previously was doing.  Able to straight leg raise today. 4/12.  Patient feeling better today.  He thinks he will be able to walk. 4/13.  Patient had bowel movements with multiple medications. 4/14.  Neurosurgery took out the drain.  Cleared to get out of the hospital whenever insurance authorizes the acute inpatient rehab. 4/15.  We received insurance authorization for acute inpatient rehab.  Changed oral pain medication to oxycodone 10mg  po q6prn and can continue robaxin.  Ted hose.  Assessment and Plan: * Cervical myelopathy (HCC) Patient presented with progressive bilateral upper extremity paresthesia and left-sided weakness with evidence of severe spinal stenosis with cord compression.  Neurosurgery took the patient urgently for cervical discectomy and fusion at C3/4 and C4/5 on the evening of 4/10.   Continue working with PT and OT.  Patient doing better with moving around and with coordination than when he came in.  Advised I increased oral oxycodone to 10mg  and can also take robaxin for spasm.  Patient to go to acute inpatient rehab today.  Cervical spinal stenosis As above  Weakness generalized Continue working with PT and OT.  To go to acute inpatient rehab today.  CAD (coronary artery disease) Patient has a distant history of obstructive CAD with PCI in both 2008 and 2013.  He had a DES placed in the mid LAD in 2013.  Continue home statin, asa  Benign essential hypertension Continue enalapril  Obesity (BMI 30-39.9) Class I with a BMI  of 34.18  Edema Ted hose, patient did not want lasix at this time.  Constipation Advised to take miralax today, continue senna         Consultants: neurosurgery Procedures performed: anterior cervical discectomy and fusion C3/4 and C4/5 Disposition: Acute inpatient reahb Diet recommendation:  Cardiac diet DISCHARGE MEDICATION: Allergies as of 04/08/2024   No Known Allergies       Medication List     STOP taking these medications    celecoxib 200 MG capsule Commonly known as: CELEBREX   cyclobenzaprine 10 MG tablet Commonly known as: FLEXERIL   metaxalone 800 MG tablet Commonly known as: SKELAXIN   nabumetone 500 MG tablet Commonly known as: RELAFEN       TAKE these medications    acetaminophen 325 MG tablet Commonly known as: TYLENOL Take 2 tablets (650 mg total) by mouth every 6 (six) hours as needed for mild pain (pain score 1-3) (or Fever >/= 101).   aspirin EC 81 MG tablet Take 1 tablet (81 mg total) by mouth daily. Swallow whole. Start taking on: April 09, 2024   atorvastatin 80 MG tablet Commonly known as: LIPITOR Take 80 mg by mouth daily.   bisacodyl 10 MG suppository Commonly known as: DULCOLAX Place 1 suppository (10 mg total) rectally daily as needed for moderate constipation.   enalapril 10 MG tablet Commonly known as: VASOTEC Take 1 tablet by mouth 2 (two) times daily.   enoxaparin 40 MG/0.4ML injection Commonly known as: LOVENOX 40 mg subcutaneous injection daily until more ambulatory then can discontinue   gabapentin 400 MG capsule Commonly known as: NEURONTIN Take 400 mg by mouth daily.   lactulose 10 GM/15ML solution Commonly known as: CHRONULAC Take 45 mLs (30 g total) by mouth daily as needed for severe constipation.   methocarbamol 500 MG tablet Commonly known as: ROBAXIN Take 1 tablet (500 mg total) by mouth every 6 (six) hours as needed for muscle spasms.   Oxycodone HCl 10 MG Tabs Take 1 tablet (10 mg total) by mouth every 6 (six) hours as needed for up to 5 days for moderate pain (pain score 4-6) or severe pain (pain score 7-10).   polyethylene glycol 17 g packet Commonly known as: MIRALAX / GLYCOLAX Take 17 g by mouth daily.   senna 8.6 MG Tabs tablet Commonly known as: SENOKOT Take 2 tablets (17.2 mg total) by mouth daily. Start taking on: April 09, 2024        Follow-up Information      Susanne Borders, [place] Follow up on 04/22/2024.   Specialty: Neurosurgery Contact information: [address] [place] [address] [phone]         acute inpatient rehab Follow up in 1 day(s).                 Discharge Exam: Filed Weights   04/03/24 1054 04/03/24 2348  Weight: 99.3 kg 105 kg   Physical Exam HENT:     Head: Normocephalic.     Mouth/Throat:     Pharynx: No oropharyngeal exudate.  Eyes:     General: Lids are normal.     Conjunctiva/sclera: Conjunctivae normal.  Cardiovascular:     Rate and Rhythm: Normal rate and regular rhythm.     Heart sounds: Normal heart sounds, S1 normal and S2 normal.  Pulmonary:     Breath sounds: No decreased breath sounds, wheezing, rhonchi or rales.  Abdominal:     Palpations: Abdomen is soft.  Tenderness: There is no abdominal tenderness.  Musculoskeletal:     Right lower leg: Swelling present.     Left lower leg: Swelling present.  Skin:    General: Skin is warm.     Findings: No rash.  Neurological:     Mental Status: He is alert and oriented to person, place, and time.     Comments: Patient able to lift knees up off the chair.  .      Condition at discharge: stable  The results of significant diagnostics from this hospitalization (including imaging, microbiology, ancillary and laboratory) are listed below for reference.   Imaging Studies: DG Cervical Spine 2-3 Views Result Date: 04/03/2024 CLINICAL DATA:  Cervical myelopathy, intraoperative exam EXAM: CERVICAL SPINE - 2-3 VIEW COMPARISON:  04/03/2024 FINDINGS: Four fluoroscopic images are obtained during the performance of the procedure and are submitted for interpretation only. Initial image demonstrates instrument overlying the C4 vertebral body. Subsequent images demonstrate ACDF at C3-4 and C4-5. Alignment is anatomic. Please refer to the operative report. Fluoroscopy time: 7 seconds, 1.4534 mGy IMPRESSION: 1. Intraoperative exam  as above.  Please refer to operative report. Electronically Signed   By: Bobbye Burrow M.D.   On: 04/03/2024 23:38   DG C-Arm 1-60 Min-No Report Result Date: 04/03/2024 Fluoroscopy was utilized by the requesting physician.  No radiographic interpretation.   DG C-Arm 1-60 Min-No Report Result Date: 04/03/2024 Fluoroscopy was utilized by the requesting physician.  No radiographic interpretation.   DG C-Arm 1-60 Min-No Report Result Date: 04/03/2024 Fluoroscopy was utilized by the requesting physician.  No radiographic interpretation.   MR LUMBAR SPINE WO CONTRAST Result Date: 04/03/2024 CLINICAL DATA:  Lower back pain, fall in November 2024, weakness of left leg. EXAM: MRI LUMBAR SPINE WITHOUT CONTRAST TECHNIQUE: Multiplanar, multisequence MR imaging of the lumbar spine was performed. No intravenous contrast was administered. COMPARISON:  Same day MRI cervical spine and MRI head. FINDINGS: Segmentation:  Standard. Alignment: Lumbar lordosis is maintained. No significant listhesis. Vertebrae: Slight irregularity of the L2 superior endplate with discogenic edema along the right aspect of the superior endplate without significant height loss, most likely related to chronic degenerative changes. Schmorl's nodes at multiple levels. No suspicious osseous lesion. Conus medullaris and cauda equina: Conus extends to the L1 level. Conus and cauda equina appear normal. Paraspinal and other soft tissues: Mild atrophy of the paraspinal musculature. The paraspinal soft tissues are otherwise unremarkable. Disc levels: T12-L1: Minimal disc bulge. Left foraminal disc protrusion. Mild-to-moderate facet arthrosis. No significant spinal canal stenosis. No significant foraminal stenosis. L1-2: Diffuse disc bulge which indents the ventral thecal sac. Moderate facet arthrosis. There is additional mild prominence of the dorsal epidural fat. Associated mild-to-moderate spinal canal stenosis with slight crowding of the cauda  equina nerve roots. No significant foraminal stenosis. L2-3: Diffuse disc bulge which indents the ventral thecal sac. Moderate facet arthrosis. Mild prominence of the dorsal epidural fat. There is severe effacement of the thecal sac with crowding of the cauda equina nerve roots and complete effacement of CSF at this level within the thecal sac. Mild bilateral foraminal stenosis. L3-4: Diffuse disc bulge which indents the ventral thecal sac. Prominence of epidural fat surrounding the thecal sac. Moderate facet arthrosis. Severe effacement of the thecal sac at this level with complete effacement of CSF. Moderate right and mild left foraminal stenosis. L4-5: Diffuse disc bulge. Moderate facet arthrosis. Prominence of the epidural fat. Severe effacement of the thecal sac with crowding of the cauda equina nerve roots and  complete effacement of CSF. Moderate to severe right and mild left foraminal stenosis. Disc bulge and facet osteophytes likely contact the exiting right L4 nerve root. L5-S1: Diffuse disc bulge. Central annular fissure. Prominence of the epidural fat. Mild to moderate effacement of the thecal sac. Moderate facet arthrosis. Mild foraminal stenosis on the right. IMPRESSION: Mild irregularity of the L2 superior endplate with adjacent edema likely reflecting discogenic edema in the setting of degenerative changes. No height loss. Recommend correlation with history of trauma and tenderness at this level. Degenerative changes of the lumbar spine as above along with prominence of the epidural fat at multiple levels. There is severe effacement of the thecal sac at L2-3 through L4-5 as above. Multilevel foraminal stenosis, greatest and moderate to severe on the right at L4-5. Disc bulge and facet osteophytes at L4-5 contact the exiting right L4 nerve root. Small central annular fissure at L5-S1. Electronically Signed   By: Denny Flack M.D.   On: 04/03/2024 15:22   MR Cervical Spine Wo Contrast Result Date:  04/03/2024 CLINICAL DATA:  Cervical radiculopathy, fall in November 2024, weakness. EXAM: MRI CERVICAL SPINE WITHOUT CONTRAST TECHNIQUE: Multiplanar, multisequence MR imaging of the cervical spine was performed. No intravenous contrast was administered. COMPARISON:  Same day MRI head. FINDINGS: Alignment: Cervical lordosis is maintained. Trace retrolisthesis of C4 on C5. Vertebrae: Degenerative endplate changes at multiple levels. There is mild discogenic edema at C3-4. Mild discogenic height loss of the C4 and C5 vertebral bodies. No evidence of acute fracture. Cord: There is cord compression at the C3-4 and C4-5 levels with diminished caliber of the cord also noted at the C5-6 level. Increased signal within the cervical cord at C3-4 and C4-5 most likely reflecting myelomalacia. Hemangioma in the T2 vertebral body. Posterior Fossa, vertebral arteries, paraspinal tissues: The visualized posterior fossa structures are unremarkable. See same day MRI brain for complete evaluation intracranial findings. Prevertebral edema from C2-C4 likely related to prominent anterior endplate osteophytes. No significant edema within the paraspinal musculature posteriorly. No evidence of ligamentous injury. Disc levels: C2-3: Disc osteophyte complex slightly eccentric to the left which indents the ventral thecal sac without contacting the spinal cord. Uncovertebral hypertrophy more pronounced on the left. Bilateral facet arthrosis. Mild right and moderate left foraminal stenosis. C3-4: Mild disc height loss. Large disc osteophyte complex which compresses the ventral cervical cord. Mild thickening of the ligamentum flavum which indents the dorsal thecal sac. Severe spinal canal stenosis and cord compression with cord signal abnormality at this level. Bilateral facet arthrosis and uncovertebral hypertrophy. Severe bilateral foraminal stenosis. C4-5: Mild disc height loss. Large disc osteophyte complex eccentric to the right which  compresses the ventral cervical cord. Thickening of the ligamentum flavum which indents the dorsal thecal sac. Severe spinal canal stenosis and cord compression with cord signal abnormality at this level. Uncovertebral hypertrophy more pronounced on the right. Bilateral facet arthrosis. Severe right and moderate to severe left foraminal stenosis. C5-6: Disc osteophyte complex and right subarticular disc protrusion which indents the ventral thecal sac. There is possible subtle flattening of the ventral cervical cord. Decreased cord caliber at this level with subtle cord signal abnormality without definite cord compression. Uncovertebral hypertrophy and facet arthrosis. Severe bilateral foraminal stenosis. C6-7: Disc osteophyte complex and left paracentral disc protrusion which indents the ventral thecal sac without contacting the spinal cord. Bilateral facet arthrosis. Uncovertebral hypertrophy more pronounced on the left. Mild-to-moderate right and moderate left foraminal stenosis. C7-T1: Small central disc protrusion. No significant spinal canal stenosis.  Bilateral facet arthrosis. No significant foraminal stenosis. IMPRESSION: Degenerative changes of the cervical spine as above. Severe spinal canal stenosis at C3-4 and C4-5 with associated cord compression. Cord signal abnormality at C3-4 and C4-5 along with cord volume loss suggestive of myelomalacia. Multilevel foraminal stenosis, greatest and severe bilaterally at C3-4, on the right at C4-5, and bilaterally at C5-6. Additional moderate to severe foraminal stenosis on the left at C4-5. Mild prevertebral edema from C2-C4 is likely related to prominent anterior endplate osteophytes. Mild discogenic edema at C3-4 which may contribute to neck pain. Electronically Signed   By: Denny Flack M.D.   On: 04/03/2024 15:13   MR BRAIN WO CONTRAST Result Date: 04/03/2024 CLINICAL DATA:  Paresthesias, numbness or tingling for 1 month in the left leg and associated  weakness. Carpal tunnel like symptoms of both upper extremities. EXAM: MRI HEAD WITHOUT CONTRAST TECHNIQUE: Multiplanar, multiecho pulse sequences of the brain and surrounding structures were obtained without intravenous contrast. COMPARISON:  None Available. FINDINGS: Brain: No acute infarct. No evidence of intracranial hemorrhage. Nonspecific scattered T2/FLAIR hyperintensity in the periventricular and subcortical white matter. Possible small remote infarct in the left cerebellum. No mass lesion or midline shift. Normal appearance of midline structures. The basilar cisterns are patent. No extra-axial fluid collections. Ventricles: Normal size and configuration of the ventricles. Vascular: Skull base flow voids are visualized. 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 Skull and upper cervical spine: Degenerative changes in the visualized upper cervical spine. There is suggestion of a large disc bulge at C3-4 better evaluated on same day MRI cervical spine. Visualized calvarium is unremarkable. Sinuses/Orbits: Orbits are symmetric. Paranasal sinuses are clear. Other: Mastoid air cells are clear. IMPRESSION: No acute intracranial abnormality. Nonspecific white matter signal abnormality which may reflect mild chronic microvascular ischemic changes. Additional considerations include sequelae of migraine headaches, prior infection/inflammatory process, or demyelination. 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 Electronically Signed   By: Denny Flack M.D.   On: 04/03/2024 15:00    Microbiology: No results found for this or any previous visit.  Labs: CBC: Recent Labs  Lab 04/03/24 1059 04/04/24 0417 04/06/24 0325  WBC 7.1 8.6 7.4   NEUTROABS 5.2  --   --   HGB 15.1 13.9 13.3  HCT 43.8 41.2 39.6  MCV 89.8 91.2 91.2  PLT 216 179 177   Basic Metabolic Panel: Recent Labs  Lab 04/03/24 1059 04/04/24 0417 04/06/24 0325  NA 139 135 139  K 3.9 3.4* 3.8  CL 108 105 104  CO2 24 22 27   GLUCOSE 108* 140* 93  BUN 11 9 13   CREATININE 0.82 0.60* 0.74  CALCIUM 8.7* 8.2* 8.4*   Liver Function Tests: Recent Labs  Lab 04/03/24 1059  AST 21  ALT 16  ALKPHOS 111  BILITOT 0.5  PROT 7.3  ALBUMIN 4.0   CBG: No results for input(s): "GLUCAP" in the last 168 hours.  Discharge time spent: greater than 30 minutes.  Signed: Verla Glaze, MD [facility] 04/08/2024

## 2024-04-08 NOTE — Progress Notes (Signed)
 Neurosurgery Progress Note  History: Nathaniel Hobbs is s/p C3-5 ACDF. Currently 5 Days Post-Op.   POD 5: Pt reports neck pain as expected. Feels as though pain is not under control with current Oxy dose POD4: Pt doing well without complaints this morning. Does report expected neck soreness POD 3: Drain Removed, continues to have stiffness and some urinary frequency without burning  POD2: States that he is feeling better.  Notices improvement in his arms and legs.  Otherwise feels quite stiff. POD1: Feels improved movement in his legs this morning and improved sensation of his arms. Overall feels pain is reasonably controlled on medications.   Physical Exam: Vitals:   04/08/24 0751 04/08/24 0858  BP: 132/68 132/68  Pulse: 64   Resp: 16   Temp: 98.7 F (37.1 C)   SpO2: 97%     AA Ox3 CNI  Strength: Side Biceps Triceps Deltoid Interossei Grip Wrist Ext. Wrist Flex.  R 4+ 4+ 4+ 4 4 4+ 4+  L 4- 4 3 3 3 3 3     Side Iliopsoas Quads Hamstring PF DF EHL  R 5 5 5 5 5 5   L 4 4+ 4 4+ 5 5    Data: No results found.   Other tests/results: see results reviewed  Assessment/Plan:  Nathaniel Hobbs is a 63 y.o presenting with rapid neurologic decline found to have severe cervical stenosis s/p C3-5 ACDF   - Continue to mobilize - pain control, wean IV medications as able. Per primary team - DVT prophylaxis JP and dressing removed - PTOT  Ludwig Safer PA-C Department of Neurosurgery

## 2024-04-08 NOTE — Assessment & Plan Note (Signed)
 Ted hose, patient did not want lasix at this time.

## 2024-04-08 NOTE — Progress Notes (Signed)
 Report called to Sybil Euler, RN at [facility] inpatient rehab.

## 2024-04-09 ENCOUNTER — Other Ambulatory Visit: Payer: Self-pay

## 2024-04-09 DIAGNOSIS — K592 Neurogenic bowel, not elsewhere classified: Secondary | ICD-10-CM | POA: Diagnosis not present

## 2024-04-09 DIAGNOSIS — R252 Cramp and spasm: Secondary | ICD-10-CM | POA: Diagnosis not present

## 2024-04-09 DIAGNOSIS — G959 Disease of spinal cord, unspecified: Secondary | ICD-10-CM | POA: Diagnosis not present

## 2024-04-09 DIAGNOSIS — N319 Neuromuscular dysfunction of bladder, unspecified: Secondary | ICD-10-CM | POA: Diagnosis not present

## 2024-04-09 LAB — CBC WITH DIFFERENTIAL/PLATELET
Abs Immature Granulocytes: 0.01 10*3/uL (ref 0.00–0.07)
Basophils Absolute: 0.1 10*3/uL (ref 0.0–0.1)
Basophils Relative: 1 %
Eosinophils Absolute: 0.1 10*3/uL (ref 0.0–0.5)
Eosinophils Relative: 2 %
HCT: 41 % (ref 39.0–52.0)
Hemoglobin: 13.9 g/dL (ref 13.0–17.0)
Immature Granulocytes: 0 %
Lymphocytes Relative: 25 %
Lymphs Abs: 1.8 10*3/uL (ref 0.7–4.0)
MCH: 30.3 pg (ref 26.0–34.0)
MCHC: 33.9 g/dL (ref 30.0–36.0)
MCV: 89.5 fL (ref 80.0–100.0)
Monocytes Absolute: 0.9 10*3/uL (ref 0.1–1.0)
Monocytes Relative: 12 %
Neutro Abs: 4.4 10*3/uL (ref 1.7–7.7)
Neutrophils Relative %: 60 %
Platelets: 175 10*3/uL (ref 150–400)
RBC: 4.58 MIL/uL (ref 4.22–5.81)
RDW: 13.2 % (ref 11.5–15.5)
WBC: 7.3 10*3/uL (ref 4.0–10.5)
nRBC: 0 % (ref 0.0–0.2)

## 2024-04-09 LAB — COMPREHENSIVE METABOLIC PANEL WITH GFR
ALT: 19 U/L (ref 0–44)
AST: 25 U/L (ref 15–41)
Albumin: 3.2 g/dL — ABNORMAL LOW (ref 3.5–5.0)
Alkaline Phosphatase: 91 U/L (ref 38–126)
Anion gap: 5 (ref 5–15)
BUN: 12 mg/dL (ref 6–20)
CO2: 27 mmol/L (ref 22–32)
Calcium: 8.5 mg/dL — ABNORMAL LOW (ref 8.9–10.3)
Chloride: 104 mmol/L (ref 98–111)
Creatinine, Ser: 0.85 mg/dL (ref 0.61–1.24)
GFR, Estimated: >60 mL/min (ref >60)
Glucose, Bld: 98 mg/dL (ref 70–99)
Potassium: 4.1 mmol/L (ref 3.5–5.1)
Sodium: 136 mmol/L (ref 135–145)
Total Bilirubin: 1.3 mg/dL — ABNORMAL HIGH (ref 0.0–1.2)
Total Protein: 6.5 g/dL (ref 6.5–8.1)

## 2024-04-09 MED ORDER — SORBITOL 70 % SOLN
60.0000 mL | Freq: Once | Status: AC
  Administered 2024-04-09: 60 mL via ORAL
  Filled 2024-04-09: qty 60

## 2024-04-09 NOTE — Progress Notes (Signed)
 Inpatient Rehabilitation  Patient information reviewed and entered into eRehab system by [facility]. Karen Kays., CCC/SLP, PPS Coordinator.  Information including medical coding, functional ability and quality indicators will be reviewed and updated through discharge.

## 2024-04-09 NOTE — Progress Notes (Signed)
 Orthopedic Tech Progress Note Patient Details:  Nathaniel Hobbs 1961-05-11 [id number]  Ortho Devices Type of Ortho Device: Soft collar Ortho Device/Splint Location: NECK Ortho Device/Splint Interventions: Ordered, Application, Adjustment, Removalapplied soft collar to patient and he did not like it. Arnetta Lank it felt like someone had their hands around his neck choking him. So I put the rolled up blanket back brace his neck as requested. I did notify charge RN    Post Interventions Patient Tolerated: Well, Unable to use device properly Instructions Provided: Care of device  Kermitt Pedlar 04/09/2024, 6:40 PM

## 2024-04-09 NOTE — Evaluation (Signed)
 Occupational Therapy Assessment and Plan  Patient Details  Name: Nathaniel Hobbs MRN: [id number] Date of Birth: 03-Apr-1961  OT Diagnosis: abnormal posture, ataxia, and muscle weakness (generalized) Rehab Potential: Rehab Potential (ACUTE ONLY): Good ELOS: 12-14 days   Today's Date: 04/09/2024 OT Individual Time: 4098-1191 & 1410-1500 OT Individual Time Calculation (min): 90 min   & 50 min  Hospital Problem: Principal Problem:   Cervical myelopathy (HCC)   Past Medical History:  Past Medical History:  Diagnosis Date   High cholesterol    Hypertension    Past Surgical History:  Past Surgical History:  Procedure Laterality Date   ANTERIOR CERVICAL DECOMP/DISCECTOMY FUSION N/A 04/03/2024   Procedure: ANTERIOR CERVICAL DECOMPRESSION/DISCECTOMY FUSION 2 LEVELS;  Surgeon: Venetia Night, MD;  Location: [facility] ORS;  Service: Neurosurgery;  Laterality: N/A;    Assessment & Plan Clinical Impression: Nathaniel Hobbs is a 63 year old right handed male with history significant for hypertension, hyperlipidemia, CAD status post PCI 2009, 2013 followed by Dr. Dorothyann Peng maintained on low-dose aspirin, prediabetes.  Per chart review patient lives with spouse.  1 level home one-step to entry.  Independent prior to admission and working full-time.  Presented to [facility] 04/03/2024 experiencing back pain with difficulty getting up from a kneeling position for several months however over the last few weeks he has noticed a significant worsening as well as paresthesia of his bilateral upper extremities and significant weakness of his left arm and left leg.  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  Cord signal abnormality at C3-4 and C4-5 along with cord volume loss suggestive of myelomalacia.  Multilevel foraminal  stenosis, greatest and severe bilaterally at C3-4, on the right at C4-5 and bilaterally at C5-6.  Additional moderate to severe foraminal stenosis on the left at C4-5.  Underwent anterior cervical discectomy and fusion at C3/4 and C4-5.  Anterior cervical instrumentation at C3-5 placement of structural allograft 04/03/2024 per Dr. Venetia Night.  JP drain removed 04/07/2024.  He was cleared to begin Lovenox for DVT prophylaxis 04/06/2024 pain management ongoing with the addition of scheduled Neurontin with oxycodone for breakthrough pain.  Patient feels like he is getting little stronger since her surgery.  Patient reports he has been having chronic issues with constipation, using his wife's Linzess or over-the-counter laxatives.  Reports he has been continent of bowel and bladder overall.  He did have incontinent episode when he was given a lot of laxatives and had a lot of diarrhea.  Therapy evaluations completed due to patient's decreased functional mobility was admitted for a comprehensive rehab program .  Patient transferred to CIR on 04/08/2024 .    Patient currently requires mod-max A with basic self-care skills secondary to muscle weakness and muscle joint tightness, decreased cardiorespiratoy endurance, and decreased sitting balance, decreased standing balance, decreased postural control, and decreased balance strategies.  Prior to hospitalization, patient could complete ADLs with modified independent .  Patient will benefit from skilled intervention to decrease level of assist with basic self-care skills and increase independence with basic self-care skills prior to discharge home with care partner.  Anticipate patient will require intermittent supervision and follow up outpatient.  OT - End of Session Activity Tolerance: Tolerates 30+ min activity with multiple rests Endurance Deficit: Yes OT Assessment Rehab Potential (ACUTE ONLY): Good OT Patient demonstrates impairments in the following  area(s): Balance;Safety;Sensory;Skin  Integrity;Edema;Endurance;Motor;Pain OT Basic ADL's Functional Problem(s): Grooming;Bathing;Dressing;Toileting OT Transfers Functional Problem(s): Toilet;Tub/Shower OT Additional Impairment(s): Fuctional Use of Upper Extremity OT Plan OT Intensity: Minimum of 1-2 x/day, 45 to 90 minutes OT Frequency: 5 out of 7 days OT Duration/Estimated Length of Stay: 12-14 days OT Treatment/Interventions: Balance/vestibular training;Discharge planning;Pain management;Self Care/advanced ADL retraining;Therapeutic Activities;UE/LE Coordination activities;Therapeutic Exercise;Skin care/wound managment;Patient/family education;Functional mobility training;Disease mangement/prevention;Cognitive remediation/compensation;Community reintegration;DME/adaptive equipment instruction;Neuromuscular re-education;Psychosocial support;Splinting/orthotics;UE/LE Strength taining/ROM;Wheelchair propulsion/positioning OT Self Feeding Anticipated Outcome(s): mod I OT Basic Self-Care Anticipated Outcome(s): mod I OT Toileting Anticipated Outcome(s): mod I OT Bathroom Transfers Anticipated Outcome(s): mod I OT Recommendation Recommendations for Other Services: Therapeutic Recreation consult Therapeutic Recreation Interventions: Pet therapy Patient destination: Home Follow Up Recommendations: Outpatient OT Equipment Recommended: To be determined   OT Evaluation Precautions/Restrictions  Precautions Precautions: Fall;Cervical Required Braces or Orthoses: Other Brace Other Brace: soft collar for comfort Restrictions Weight Bearing Restrictions Per Provider Order: No General Chart Reviewed: Yes Response to Previous Treatment: Not applicable Family/Caregiver Present: No Pain Pain Assessment Pain Scale: 0-10 Pain Score: 5  Pain Type: Surgical pain Pain Location: Neck Pain Orientation: Anterior;Posterior Pain Descriptors / Indicators: Aching Pain Onset: On-going Pain  Intervention(s): Medication (See eMAR) Home Living/Prior Functioning Home Living Family/patient expects to be discharged to:: Private residence Living Arrangements: Spouse/significant other, Children Available Help at Discharge: Family, Available 24 hours/day Type of Home: House Home Access: Stairs to enter Secretary/administrator of Steps: 1 Entrance Stairs-Rails: Left Home Layout: One level Bathroom Shower/Tub: Armed forces training and education officer: Yes  Lives With: Spouse, Daughter IADL History Homemaking Responsibilities: Yes Current License: Yes Occupation: Full time employment 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 Prior Function Level of Independence: Independent with transfers, Independent with gait  Able to Take Stairs?: Yes Driving: Yes Vocation: Full time employment Leisure: Hobbies-yes (Comment) Vision Baseline Vision/History: 0 No visual deficits Ability to See in Adequate Light: 0 Adequate Patient Visual Report: No change from baseline Vision Assessment?: No apparent visual deficits Perception  Perception: Within Functional Limits Praxis Praxis: WFL Cognition Cognition Overall Cognitive Status: Within Functional Limits for tasks assessed Arousal/Alertness: Awake/alert Orientation Level: Person;Place;Situation Person: Oriented Place: Oriented Situation: Oriented Memory: Appears intact Awareness: Appears intact Problem Solving: Appears intact Safety/Judgment: Appears intact Brief Interview for Mental Status (BIMS) Repetition of Three Words (First Attempt): 3 Temporal Orientation: Year: Correct Temporal Orientation: Month: Accurate within 5 days Temporal Orientation: Day: Correct Recall: "Sock": Yes, no cue required Recall: "Blue": Yes, no cue required Recall: "Bed": Yes, no cue required BIMS Summary Score: 15 Sensation Sensation Light Touch: Impaired Detail (numbness/tingling in BUE and BLE,  reporting light/deep pressure feeling same on BUE and BLE during gross assessment) Light Touch Impaired Details: Impaired RUE;Impaired LUE;Impaired RLE;Impaired LLE Hot/Cold: Appears Intact Proprioception: Impaired by gross assessment Stereognosis: Not tested Coordination Gross Motor Movements are Fluid and Coordinated: No Fine Motor Movements are Fluid and Coordinated: No Coordination and Movement Description: slight ataxic movements of BUE Motor  Motor Motor: Abnormal tone Motor - Skilled Clinical Observations: B strength/coordination deficits L> R.  Trunk/Postural Assessment  Cervical Assessment Cervical Assessment: Exceptions to [facility] (forward head) Thoracic Assessment Thoracic Assessment: Exceptions to [facility] (rounded shoulders) Lumbar Assessment Lumbar Assessment: Exceptions to [facility] (posterior pelvic tilt) Postural Control Postural Control: Deficits on evaluation Righting Reactions: delayed Protective Responses: delayed.  Balance Balance Balance Assessed: Yes Static Sitting Balance Static Sitting - Balance Support: Feet supported Static Sitting - Level of Assistance: 5: Stand by assistance Dynamic Sitting Balance Dynamic Sitting -  Balance Support: Feet supported Dynamic Sitting - Level of Assistance: 5: Stand by assistance;4: Min assist Static Standing Balance Static Standing - Balance Support: Bilateral upper extremity supported Static Standing - Level of Assistance: 4: Min assist Dynamic Standing Balance Dynamic Standing - Balance Support: Bilateral upper extremity supported Dynamic Standing - Level of Assistance: 4: Min assist;3: Mod assist Dynamic Standing - Balance Activities: Forward lean/weight shifting;Reaching for objects Extremity/Trunk Assessment RUE Assessment RUE Assessment: Exceptions to [facility] Active Range of Motion (AROM) Comments: shoulder flexion ~100*, WFL all other joints, although slight ataxia General Strength Comments: 4-/5 overall LUE Assessment LUE  Assessment: Exceptions to [facility] Active Range of Motion (AROM) Comments: frozen shoulder ~50*, WFL all other joints, although slight ataxia General Strength Comments: 3+/5 overall  Care Tool Care Tool Self Care Eating   Eating Assist Level: Set up assist    Oral Care    Oral Care Assist Level: Supervision/Verbal cueing    Bathing         Assist Level: Moderate Assistance - Patient 50 - 74%    Upper Body Dressing(including orthotics)   What is the patient wearing?: Pull over shirt   Assist Level: Minimal Assistance - Patient > 75%    Lower Body Dressing (excluding footwear)   What is the patient wearing?: Pants Assist for lower body dressing: Maximal Assistance - Patient 25 - 49%    Putting on/Taking off footwear   What is the patient wearing?: Non-skid slipper socks Assist for footwear: Dependent - Patient 0%       Care Tool Toileting Toileting activity   Assist for toileting: Moderate Assistance - Patient 50 - 74%     Care Tool Bed Mobility Roll left and right activity   Roll left and right assist level: Maximal Assistance - Patient 25 - 49%    Sit to lying activity   Sit to lying assist level: Moderate Assistance - Patient 50 - 74%    Lying to sitting on side of bed activity   Lying to sitting on side of bed assist level: the ability to move from lying on the back to sitting on the side of the bed with no back support.: Moderate Assistance - Patient 50 - 74%     Care Tool Transfers Sit to stand transfer   Sit to stand assist level: Moderate Assistance - Patient 50 - 74%    Chair/bed transfer   Chair/bed transfer assist level: Moderate Assistance - Patient 50 - 74%     Toilet transfer   Assist Level: Moderate Assistance - Patient 50 - 74%     Care Tool Cognition  Expression of Ideas and Wants Expression of Ideas and Wants: 4. Without difficulty (complex and basic) - expresses complex messages without difficulty and with speech that is clear and easy to  understand  Understanding Verbal and Non-Verbal Content Understanding Verbal and Non-Verbal Content: 4. Understands (complex and basic) - clear comprehension without cues or repetitions   Memory/Recall Ability Memory/Recall Ability : Current season;Location of own room;Staff names and faces;That he or she is in a hospital/hospital unit   Refer to Care Plan for Long Term Goals  SHORT TERM GOAL WEEK 1 OT Short Term Goal 1 (Week 1): Pt will complete LB dressing with Min A with AE as necessary OT Short Term Goal 2 (Week 1): Pt will complete toilet ttransfer at [facility] with AE as necessary OT Short Term Goal 3 (Week 1): Pt will complete UB dressing with CGA  Recommendations for other services:  Therapeutic Recreation  Pet therapy   Skilled Therapeutic Intervention ADL ADL Eating: Set up Grooming: Supervision/safety Where Assessed-Grooming: Sitting at sink Upper Body Bathing: Minimal assistance Where Assessed-Upper Body Bathing: Shower Lower Body Bathing: Minimal assistance Where Assessed-Lower Body Bathing: Shower Upper Body Dressing: Minimal assistance Where Assessed-Upper Body Dressing: Chair Lower Body Dressing: Maximal assistance Where Assessed-Lower Body Dressing: Chair Toileting: Moderate assistance Where Assessed-Toileting: Toilet;Bedside Commode Toilet Transfer: Minimal assistance Toilet Transfer Method: Proofreader: Grab bars;Bedside commode Tub/Shower Transfer: Not assessed Psychologist, counselling Transfer: Minimal assistance Film/video editor Method: Designer, industrial/product: Transfer tub bench;Grab bars ADL Comments: Issued long handle sponge and introduced reacher for LB dressing. Pt taking PRN rest breaks during ADL tasks during session. Pt using RW during all functional mobility throughout room with no LOB/SOB or buckling noted. Mobility  Bed Mobility Bed Mobility: Sit to Sidelying Left;Left Sidelying to Sit Left Sidelying to Sit: Maximal  Assistance - Patient 25-49% Sit to Sidelying Left: Minimal Assistance - Patient > 75% Transfers Sit to Stand: Moderate Assistance - Patient 50-74%;Minimal Assistance - Patient > 75% Stand to Sit: Moderate Assistance - Patient 50-74%;Minimal Assistance - Patient > 75%  Session 1 1:1 evaluation and treatment session initiated this date. OT roles, goals and purpose discussed with pt as well as therapy schedule. ADL completed this date with levels of assist listed above. OT spoke with MD on soft collar for pain management and stabilization of neck, MD ordered. Pt would benefit from skilled OT in IPR setting in order to maximize independence with ADLs upon D/C.    Session 2 General: "This was a good day!" Pt seated in W/C upon OT arrival, agreeable to OT.  Pain:  5/10 pain reported in neck, activity, intermittent rest breaks, distractions provided for pain management, pt reports tolerable to proceed.   Exercises: OT instructing pt on 9HPT in order to complete standardized test for baseline BUE function. Pt scored R-1:20  L-2:35 which is not WNL of age and sex per pt demographics. Pt will be re-tested near D/C to determine increase of function.   Other Treatments: Pt recieved foam block hand home exercise program in order to increase strength/ROM of BUE. Pt educated on purpose of exercises and reps/sets to complete. Pt given blue foam block and instructed on the following exercises:  -power grip -key pinch -pincer grasp -three finger pinch -thumb flexion -PIP grip  OT instructing pt on completing exercises with pink theraputty as well as addition of small beads for [facility], ataxia management and intrinsic muscle strength.  Pt seated in recliner at end of session with W/C alarm donned, call light within reach and 4Ps assessed.    Discharge Criteria: Patient will be discharged from OT if patient refuses treatment 3 consecutive times without medical reason, if treatment goals not met, if there is a  change in medical status, if patient makes no progress towards goals or if patient is discharged from hospital.  The above assessment, treatment plan, treatment alternatives and goals were discussed and mutually agreed upon: by patient  Nila Barth, OTD, OTR/L 04/09/2024, 4:11 PM

## 2024-04-09 NOTE — Progress Notes (Signed)
 Inpatient Rehabilitation Center Individual Statement of Services  Patient Name:  Orton Capell  Date:  04/09/2024  Welcome to the Inpatient Rehabilitation Center.  Our goal is to provide you with an individualized program based on your diagnosis and situation, designed to meet your specific needs.  With this comprehensive rehabilitation program, you will be expected to participate in at least 3 hours of rehabilitation therapies Monday-Friday, with modified therapy programming on the weekends.  Your rehabilitation program will include the following services:  Physical Therapy (PT), Occupational Therapy (OT), 24 hour per day rehabilitation nursing, Therapeutic Recreaction (TR), Neuropsychology, Care Coordinator, Rehabilitation Medicine, Nutrition Services, and Pharmacy Services  Weekly team conferences will be held on Tuesday to discuss your progress.  Your Inpatient Rehabilitation Care Coordinator will talk with you frequently to get your input and to update you on team discussions.  Team conferences with you and your family in attendance may also be held.  Expected length of stay: 12-14 days  Overall anticipated outcome: Independent with device  Depending on your progress and recovery, your program may change. Your Inpatient Rehabilitation Care Coordinator will coordinate services and will keep you informed of any changes. Your Inpatient Rehabilitation Care Coordinator's name and contact numbers are listed  below.  The following services may also be recommended but are not provided by the Inpatient Rehabilitation Center:  Driving Evaluations Home Health Rehabiltiation Services Outpatient Rehabilitation Services    Arrangements will be made to provide these services after discharge if needed.  Arrangements include referral to agencies that provide these services.  Your insurance has been verified to be:  [employer] Your primary doctor is:  Administrator, sports  Pertinent information will be shared  with your doctor and your insurance company.  Inpatient Rehabilitation Care Coordinator:  Adrianna Albee, [place] [phone] or Justine Oms  Information discussed with and copy given to patient by: [name], 04/09/2024, 9:02 AM

## 2024-04-09 NOTE — Progress Notes (Addendum)
 PROGRESS NOTE   Subjective/Complaints:  No new concerns this morning.  Continues to have pain in his neck around the surgical site, Robaxin and oxycodone helping to keep this under control. LBM 04/06/24  ROS: Patient denies fever, new vision changes, dizziness, nausea, vomiting, diarrhea,  shortness of breath or chest pain, headache, or mood change. Bladder urgency + constipation    Objective:   No results found. Recent Labs    04/09/24 0518  WBC 7.3  HGB 13.9  HCT 41.0  PLT 175   Recent Labs    04/09/24 0518  NA 136  K 4.1  CL 104  CO2 27  GLUCOSE 98  BUN 12  CREATININE 0.85  CALCIUM 8.5*    Intake/Output Summary (Last 24 hours) at 04/09/2024 1542 Last data filed at 04/09/2024 1400 Gross per 24 hour  Intake 240 ml  Output 800 ml  Net -560 ml        Physical Exam: Vital Signs Blood pressure (P) 108/63, pulse (P) 64, temperature (P) 98.1 F (36.7 C), temperature source (P) Oral, resp. rate (P) 18, height 5\' 9"  (1.753 m), weight 98 kg, SpO2 (P) 95%.   General: No apparent distress HEENT: Head is normocephalic, atraumatic, sclera anicteric, oral mucosa pink and moist Heart: Reg rate and rhythm. No murmurs rubs or gallops Chest: CTA bilaterally without wheezes Abdomen: Soft, non-tender, mildly-distended, bowel sounds positive-normoactive Extremities: 1+ edema LUE and b/l LE Psych: Pt's affect is appropriate. Pt is cooperative Skin: Surgical incision CDI Neuro:     Mental Status: AAOx3,  no apparent cognitive deficits noted CRANIAL NERVES: 2-12 grossly intact     MOTOR: RUE: 4/5 Deltoid, 4/5 Biceps, 4/5 Triceps, 4-/5 Grip LUE: 3/5 Deltoid, 4-/5 Biceps, 4-/5 Triceps, 3/5 Grip RLE: HF 4-/5, KE 4/5, ADF 4-/5, APF 4-/5 LLE: HF 3/5, KE 4-/5, ADF 4-/5, APF 4-/5     REFLEXES: Mild hypertonia left upper extremity and left lower extremity- improved today Slight hypertonia right lower extremity    SENSORY: Intact but altered light touch all 4 extremities   MSK: No joint tenderness/swelling noted.    Assessment/Plan: 1. Functional deficits which require 3+ hours per day of interdisciplinary therapy in a comprehensive inpatient rehab setting. Physiatrist is providing close team supervision and 24 hour management of active medical problems listed below. Physiatrist and rehab team continue to assess barriers to discharge/monitor patient progress toward functional and medical goals  Care Tool:  Bathing              Bathing assist Assist Level: Moderate Assistance - Patient 50 - 74%     Upper Body Dressing/Undressing Upper body dressing   What is the patient wearing?: Pull over shirt    Upper body assist Assist Level: Minimal Assistance - Patient > 75%    Lower Body Dressing/Undressing Lower body dressing      What is the patient wearing?: Pants     Lower body assist Assist for lower body dressing: Maximal Assistance - Patient 25 - 49%     Toileting Toileting    Toileting assist Assist for toileting: Moderate Assistance - Patient 50 - 74%     Transfers Chair/bed transfer  Transfers assist  Chair/bed transfer assist level: Moderate Assistance - Patient 50 - 74%     Locomotion Ambulation   Ambulation assist      Assist level: Minimal Assistance - Patient > 75% Assistive device: Walker-rolling Max distance: 50   Walk 10 feet activity   Assist     Assist level: Minimal Assistance - Patient > 75% Assistive device: Walker-rolling   Walk 50 feet activity   Assist    Assist level: Minimal Assistance - Patient > 75% Assistive device: Walker-rolling    Walk 150 feet activity   Assist Walk 150 feet activity did not occur: Safety/medical concerns         Walk 10 feet on uneven surface  activity   Assist     Assist level: Minimal Assistance - Patient > 75% Assistive device: Walker-rolling   Wheelchair     Assist Is  the patient using a wheelchair?: No      Wheelchair assist level: Dependent - Patient 0%      Wheelchair 50 feet with 2 turns activity    Assist        Assist Level: Dependent - Patient 0%   Wheelchair 150 feet activity     Assist      Assist Level: Dependent - Patient 0%   Blood pressure (P) 108/63, pulse (P) 64, temperature (P) 98.1 F (36.7 C), temperature source (P) Oral, resp. rate (P) 18, height 5\' 9"  (1.753 m), weight 98 kg, SpO2 (P) 95%.   Medical Problem List and Plan: 1. Functional deficits secondary to cervical stenosis/myelopathy status post ACDF C3-5 04/03/2024             -patient may shower, cover incision please             -ELOS/Goals: 14-16 days, PT/OT supervision/Mod I             -Admit to CIR             -PFRAOs b/l 2.  Antithrombotics: -DVT/anticoagulation:  Pharmaceutical: Lovenox.  Check vascular             -antiplatelet therapy: Aspirin 81 mg daily 3. Pain Management: Neurontin 400 mg daily, Robaxin 500 mg every 6 hours as needed, oxycodone  as needed 4. Mood/Behavior/Sleep: Provide emotional support             -antipsychotic agents: N/A 5. Neuropsych/cognition: This patient is capable of making decisions on his own behalf. 6. Skin/Wound Care: Routine skin checks 7. Fluids/Electrolytes/Nutrition: Routine in and outs with follow-up chemistries 8.  Constipation vs Neurogenic bowel              - Scheduled MiraLAX twice daily and continue Senokot.  Consider suppository bowel program if not having regular Bms  -Pt doesn't want to try suppository, will do sorbitol 60ml for now 9.  Hypertension.  Vasotec 10 mg twice daily.  Monitor with increased mobility             -4/16 controlled      04/09/2024    3:24 PM 04/09/2024    5:19 AM 04/08/2024    8:03 PM  Vitals with BMI  Height   5\' 9"   Weight   216 lbs 1 oz  BMI   31.89  Systolic 108 164 657  Diastolic 63 88 81  Pulse 64 75 78    10.  CAD status post PCI 2000 08/2012.  Follow-up  with cardiology services Dr. Dorothyann Peng. Denies CP 11.  Hyperlipidemia.  Lipitor 12.  Obesity.  BMI 34.18.  Dietary follow-up 13. Possible Neurogenic bladder             -PVR/bladder scans IC if greater than 350  -Will ask nursing check bladder scan/PVR 14) Spasticity - Will start baclofen 5 mg 3 times daily -4/16 appears improved this am  LOS: 1 days A FACE TO FACE EVALUATION WAS PERFORMED  Lylia Sand 04/09/2024, 3:42 PM

## 2024-04-09 NOTE — Progress Notes (Signed)
 Inpatient Rehabilitation Care Coordinator Assessment and Plan Patient Details  Name: Nathaniel Hobbs MRN: [id number] Date of Birth: [birth year]  Today's Date: 04/09/2024  Hospital Problems: Principal Problem:   Cervical myelopathy [facility])  Past Medical History:  Past Medical History:  Diagnosis Date   High cholesterol    Hypertension    Past Surgical History:  Past Surgical History:  Procedure Laterality Date   ANTERIOR CERVICAL DECOMP/DISCECTOMY FUSION N/A 04/03/2024   Procedure: ANTERIOR CERVICAL DECOMPRESSION/DISCECTOMY FUSION 2 LEVELS;  Surgeon: Venetia Night, MD;  Location: [facility] ORS;  Service: Neurosurgery;  Laterality: N/A;   Social History:  reports that he has never smoked. He has never used smokeless tobacco. He reports that he does not drink alcohol and does not use drugs.  Family / Support Systems Marital Status: Married Patient Roles: Spouse, Parent, Other (Comment) (Friend) Spouse/Significant Other: [name] [phone] Children: Daughter lives with parents works second Other Supports: Friends Anticipated Caregiver: Wife and daughter Ability/Limitations of Caregiver: Wife works days and daughter works evenings two hours alone at [employer] Caregiver Availability: Other (Comment) (Almost 24 hr 2 hours not covered) Family Dynamics: Close with small family unit, all pull together when one is in need. Pt is not concerned his needs wil be met when goes home  Social History Preferred language: English Religion: Christian Reformed Cultural Background: NA Education: HS Health Literacy - How often do you need to have someone help you when you read instructions, pamphlets, or other written material from your doctor or pharmacy?: Never Writes: Yes Employment Status: Unemployed Marine scientist Issues: NA Guardian/Conservator: None-according to MD pt is capable of making his own decisions while here   Abuse/Neglect Abuse/Neglect Assessment Can Be Completed:  Yes Physical Abuse: Denies Verbal Abuse: Denies Sexual Abuse: Denies Exploitation of patient/patient's resources: Denies Self-Neglect: Denies  Patient response to: Social Isolation - How often do you feel lonely or isolated from those around you?: Never  Emotional Status Pt's affect, behavior and adjustment status: Pt is motivated to get moving and have less pain. He was independent prior to admission until he fell last Nov. He has slowly declined and got to the point he could not move well at all. Recent Psychosocial Issues: other health issues Psychiatric History: No history seems to be coping ok but may need to see neuro-psych to assist with coping. Will get input from team after evaluations today Substance Abuse History: NA  Patient / Family Perceptions, Expectations & Goals Pt/Family understanding of illness & functional limitations: Pt can explain his surgery and limtations he is having pain issues-and hopeful his pain can be managed while here. He relaizes he will have pain but hopefully not this much Premorbid pt/family roles/activities: husband, father, friend, etc Anticipated changes in roles/activities/participation: resume Pt/family expectations/goals: Pt states: " I hope the doctor can get my pain better, I hope to be able to move on my own also."  [employer]: None Premorbid Home Care/DME Agencies: Other (Comment) (rw cane) Transportation available at discharge: wife pt was driving before back got so bad Is the patient able to respond to transportation needs?: Yes In the past 12 months, has lack of transportation kept you from medical appointments or from getting medications?: No In the past 12 months, has lack of transportation kept you from meetings, work, or from getting things needed for daily living?: No Resource referrals recommended: Neuropsychology  Discharge Planning Living Arrangements: Spouse/significant other, Children Support  Systems: Spouse/significant other, Children, Friends/neighbors Type of Residence: Private residence Insurance Resources: [employer] (  specify) Administrator [facility]) Financial Resources: Family Support Financial Screen Referred: Yes Living Expenses: Rent Money Management: Patient, Spouse Does the patient have any problems obtaining your medications?: No Home Management: both Patient/Family Preliminary Plans: Return home with wife and daughter between them they can provide 22 hours of coverage with their work schedules. Aware being evaluated today anf goals being set for stay here. Care Coordinator Barriers to Discharge: Insurance for SNF coverage Care Coordinator Anticipated Follow Up Needs: HH/OP  Clinical Impression Pleasant gentleman who is ready to do rehab and hopeful he will do well here. His wife and daughter are involved and supportive. Will await team's evaluations  [name] 04/09/2024, 9:01 AM

## 2024-04-09 NOTE — Evaluation (Signed)
 Physical Therapy Assessment and Plan  Patient Details  Name: Nathaniel Hobbs MRN: [id number] Date of Birth: [birth year]  PT Diagnosis: Abnormal posture, Abnormality of gait, Coordination disorder, Difficulty walking, Impaired sensation, and Muscle weakness Rehab Potential: Good ELOS: 12-14 days   Today's Date: 04/09/2024 PT Individual Time: 1045-1200 PT Individual Time Calculation (min): 75 min    Hospital Problem: Principal Problem:   Cervical myelopathy (HCC)   Past Medical History:  Past Medical History:  Diagnosis Date   High cholesterol    Hypertension    Past Surgical History:  Past Surgical History:  Procedure Laterality Date   ANTERIOR CERVICAL DECOMP/DISCECTOMY FUSION N/A 04/03/2024   Procedure: ANTERIOR CERVICAL DECOMPRESSION/DISCECTOMY FUSION 2 LEVELS;  Surgeon: Nathaniel Night, MD;  Location: [facility] ORS;  Service: Neurosurgery;  Laterality: N/A;    Assessment & Plan Clinical Impression: Nathaniel Hobbs is a 63 year old right handed male with history significant for hypertension, hyperlipidemia, CAD status post PCI 2009, 2013 followed by Dr. Dorothyann Hobbs maintained on low-dose aspirin, prediabetes.  Per chart review patient lives with spouse.  1 level home one-step to entry.  Independent prior to admission and working full-time.  Presented to [facility] 04/03/2024 experiencing back pain with difficulty getting up from a kneeling position for several months however over the last few weeks he has noticed a significant worsening as well as paresthesia of his bilateral upper extremities and significant weakness of his left arm and left leg.  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  Cord signal abnormality at C3-4 and C4-5 along with cord volume loss suggestive of myelomalacia.  Multilevel  foraminal stenosis, greatest and severe bilaterally at C3-4, on the right at C4-5 and bilaterally at C5-6.  Additional moderate to severe foraminal stenosis on the left at C4-5.  Underwent anterior cervical discectomy and fusion at C3/4 and C4-5.  Anterior cervical instrumentation at C3-5 placement of structural allograft 04/03/2024 per Dr. Venetia Hobbs.  JP drain removed 04/07/2024.  He was cleared to begin Lovenox for DVT prophylaxis 04/06/2024 pain management ongoing with the addition of scheduled Neurontin with oxycodone for breakthrough pain.  Patient feels like he is getting little stronger since her surgery.  Patient reports he has been having chronic issues with constipation, using his wife's Linzess or over-the-counter laxatives.  Reports he has been continent of bowel and bladder overall.  He did have incontinent episode when he was given a lot of laxatives and had a lot of diarrhea.  Therapy evaluations completed due to patient's decreased functional mobility was admitted for a comprehensive rehab program   Patient currently requires max with mobility secondary to muscle weakness and abnormal tone and decreased coordination.  Prior to hospitalization, patient was supervision with mobility and lived with Spouse, Daughter in a House home.  Home access is 1Stairs to enter.  Patient will benefit from skilled PT intervention to maximize safe functional mobility, minimize fall risk, and decrease caregiver burden for planned discharge home with 24 hour supervision.  Anticipate patient will benefit from follow up OP at discharge.  PT - End of Session Activity Tolerance: Tolerates 30+ min activity with multiple rests Endurance Deficit: Yes PT Assessment Rehab Potential (ACUTE/IP ONLY): Good PT Barriers to Discharge: Home environment access/layout PT Patient demonstrates impairments in the following area(s): Balance;Safety;Edema;Endurance;Motor;Pain PT Transfers Functional Problem(s): Bed  Mobility;Bed to Chair;Car;Furniture PT Locomotion Functional Problem(s):  Ambulation;Stairs PT Plan PT Intensity: Minimum of 1-2 x/day ,45 to 90 minutes PT Frequency: 5 out of 7 days PT Duration Estimated Length of Stay: 12-14 days PT Treatment/Interventions: Ambulation/gait training;Community reintegration;Neuromuscular re-education;Stair training;UE/LE Strength taining/ROM;Therapeutic Activities;UE/LE Coordination activities;Pain management;Discharge planning;Balance/vestibular training;Functional mobility training;Therapeutic Exercise PT Transfers Anticipated Outcome(s): mod I PT Locomotion Anticipated Outcome(s): Mod I PT Recommendation Follow Up Recommendations: Outpatient PT Patient destination: Home Equipment Recommended: To be determined Equipment Details: pt has SPC only at home   PT Evaluation Precautions/Restrictions Precautions Precautions: Fall;Cervical Required Braces or Orthoses: Other Brace Other Brace: soft collar for comfort Restrictions Weight Bearing Restrictions Per Provider Order: No General Chart Reviewed: Yes Family/Caregiver Present: No Vital Signs  Pain Pain Assessment Pain Scale: 0-10 Pain Score: 5  Pain Type: Surgical pain Pain Location: Neck Pain Orientation: Anterior;Posterior Pain Descriptors / Indicators: Aching Pain Onset: On-going Pain Intervention(s): Medication (See eMAR) Pain Interference Pain Interference Pain Effect on Sleep: 1. Rarely or not at all Pain Interference with Therapy Activities: 1. Rarely or not at all Pain Interference with Day-to-Day Activities: 1. Rarely or not at all Home Living/Prior Functioning Home Living Living Arrangements: Spouse/significant other;Children Available Help at Discharge: Family;Available 24 hours/day Type of Home: House Home Access: Stairs to enter [employer] of Steps: 1 Entrance Stairs-Rails: Left Home Layout: One level Bathroom Shower/Tub: Nurse, adult  Accessibility: Yes  Lives With: Spouse;Daughter Prior Function Level of Independence: Independent with transfers;Independent with gait  Able to Take Stairs?: Yes Driving: Yes Vocation: Full time employment Leisure: Hobbies-yes (Comment) Vision/Perception     Cognition Overall Cognitive Status: Within Functional Limits for tasks assessed Orientation Level: Oriented X4 Memory: Appears intact Awareness: Appears intact Safety/Judgment: Appears intact Sensation Sensation Light Touch: Impaired Detail (pt states "feels like fell asleep" but still able to discriminate gross touch.) Coordination Gross Motor Movements are Fluid and Coordinated: No Fine Motor Movements are Fluid and Coordinated: No Motor  Motor Motor: Abnormal tone Motor - Skilled Clinical Observations: B strength/coordination deficits L> R.   Trunk/Postural Assessment  Cervical Assessment Cervical Assessment: Exceptions to [facility] (forward head) Thoracic Assessment Thoracic Assessment: Exceptions to [facility] (rounded shoulders.) Lumbar Assessment Lumbar Assessment: Exceptions to [facility] (posterior pelvic tilt.) Postural Control Postural Control: Deficits on evaluation Righting Reactions: delayed Protective Responses: delayed.  Balance Balance Balance Assessed: Yes Static Sitting Balance Static Sitting - Balance Support: Feet supported Static Sitting - Level of Assistance: 5: Stand by assistance Dynamic Sitting Balance Dynamic Sitting - Balance Support: Feet supported Dynamic Sitting - Level of Assistance: 5: Stand by assistance;4: Min assist Static Standing Balance Static Standing - Balance Support: Bilateral upper extremity supported Static Standing - Level of Assistance: 4: Min assist Dynamic Standing Balance Dynamic Standing - Balance Support: Bilateral upper extremity supported Dynamic Standing - Level of Assistance: 4: Min assist Extremity Assessment      RLE Assessment RLE Assessment: Within Functional  Limits General Strength Comments: 5/5 LLE Assessment LLE Assessment: Within Functional Limits General Strength Comments: grossly 4/5  Care Tool Care Tool Bed Mobility Roll left and right activity        Sit to lying activity   Sit to lying assist level: Moderate Assistance - Patient 50 - 74%    Lying to sitting on side of bed activity   Lying to sitting on side of bed assist level: the ability to move from lying on the back to sitting on the side of the bed with no back support.: Maximal Assistance - Patient 25 - 49%  Care Tool Transfers Sit to stand transfer   Sit to stand assist level: Moderate Assistance - Patient 50 - 74%    Chair/bed transfer   Chair/bed transfer assist level: Moderate Assistance - Patient 50 - 74%    Car transfer   Car transfer assist level: Maximal Assistance - Patient 25 - 49%      Care Tool Locomotion Ambulation   Assist level: Minimal Assistance - Patient > 75% Assistive device: Walker-rolling Max distance: 50  Walk 10 feet activity   Assist level: Minimal Assistance - Patient > 75% Assistive device: Walker-rolling   Walk 50 feet with 2 turns activity   Assist level: Minimal Assistance - Patient > 75% Assistive device: Walker-rolling  Walk 150 feet activity Walk 150 feet activity did not occur: Safety/medical concerns      Walk 10 feet on uneven surfaces activity   Assist level: Minimal Assistance - Patient > 75% Assistive device: Walker-rolling  Stairs   Assist level: Moderate Assistance - Patient - 50 - 74% Stairs assistive device: 2 hand rails Max number of stairs: 4  Walk up/down 1 step activity   Walk up/down 1 step (curb) assist level: Moderate Assistance - Patient - 50 - 74% Walk up/down 1 step or curb assistive device: 2 hand rails  Walk up/down 4 steps activity   Walk up/down 4 steps assist level: Moderate Assistance - Patient - 50 - 74% Walk up/down 4 steps assistive device: 2 hand rails  Walk up/down 12 steps activity  Walk up/down 12 steps activity did not occur: Safety/medical concerns      Pick up small objects from floor Pick up small object from the floor (from standing position) activity did not occur: Safety/medical concerns      Wheelchair Is the patient using a wheelchair?: No     Wheelchair assist level: Dependent - Patient 0%    Wheel 50 feet with 2 turns activity   Assist Level: Dependent - Patient 0%  Wheel 150 feet activity   Assist Level: Dependent - Patient 0%    Refer to Care Plan for Long Term Goals  SHORT TERM GOAL WEEK 1 PT Short Term Goal 1 (Week 1): Pt will transfer sup to sit w/ min A PT Short Term Goal 2 (Week 1): Pt will transfer sit to stand w/ CGA PT Short Term Goal 3 (Week 1): Pt will amb w/ RW x 100 and C/S PT Short Term Goal 4 (Week 1): Pt will negotiate stairs w/ 1 rail and min A.  Recommendations for other services: None   Skilled Therapeutic Intervention Evaluation completed (see details above and below) with education on PT POC and goals and individual treatment initiated with focus on  strengthening, transfers, balance, gait, NMR.  Pt presents sitting in recliner and agreeable to therapy.  Pt transfers sit to stand throughout session from recliner, bed, car and w.c w/ mod A initially and progressing to min Aw/ cueing for sequencing, especially scoot forward.  Pt requires hand over hand for placement and maintenance of L hand.  Pt performed step pivot to bed w/ mod A and cues for foot clearanc.  Pt transfers sit to sidelying w/ mod/min A for LES and max A for l sidelying to sit 2/ UE weakness.  Pt amb x 15' to doorway w/ RW and min A.  Pt amb to [facility] height car setup and required mod/Max  A for LES.  Pt negotiated 4 steps w/ B rails and mod/min A step-to gait pattern and  cues for breathing technique.  Pt amb w/ RW and min A 50' and cues for turns as well as LLE foot clearance.  Pt remained sitting in recliner w/ chair alarm on and all needs in  reach.    Mobility Bed Mobility Bed Mobility: Sit to Sidelying Left;Left Sidelying to Sit Left Sidelying to Sit: Maximal Assistance - Patient 25-49% (2/2 LUE weakness, pt sleeps on this side of the bed.) Sit to Sidelying Left: Minimal Assistance - Patient > 75% Transfers Transfers: Sit to Stand;Stand to Sit;Stand Pivot Transfers Sit to Stand: Moderate Assistance - Patient 50-74%;Minimal Assistance - Patient > 75% Stand to Sit: Moderate Assistance - Patient 50-74%;Minimal Assistance - Patient > 75% Stand Pivot Transfers: Moderate Assistance - Patient 50 - 74% Stand Pivot Transfer Details: Visual cues/gestures for sequencing;Verbal cues for sequencing Stand Pivot Transfer Details (indicate cue type and reason): cues for weight shift Transfer (Assistive device): None Locomotion  Gait Ambulation: Yes Gait Assistance: Minimal Assistance - Patient > 75% Gait Distance (Feet): 50 Feet Assistive device: Rolling walker Gait Assistance Details: Verbal cues for safe use of DME/AE;Verbal cues for technique Gait Assistance Details: cues for turns Gait Gait: Yes Gait Pattern: Step-through pattern;Trunk flexed (but posture improved w/ cuing and time.) Gait velocity: decreased Stairs / Additional Locomotion Stairs: Yes Stairs Assistance: Moderate Assistance - Patient 50 - 74% Stair Management Technique: Two rails;Step to pattern Number of Stairs: 4 Height of Stairs: 6 Ramp: Minimal Assistance - Patient >75% Wheelchair Mobility Wheelchair Mobility: No   Discharge Criteria: Patient will be discharged from PT if patient refuses treatment 3 consecutive times without medical reason, if treatment goals not met, if there is a change in medical status, if patient makes no progress towards goals or if patient is discharged from hospital.  The above assessment, treatment plan, treatment alternatives and goals were discussed and mutually agreed upon: by patient  Odessia Benedict 04/09/2024, 12:38  PM

## 2024-04-09 NOTE — Plan of Care (Signed)
  Problem: RH Balance Goal: LTG Patient will maintain dynamic standing with ADLs (OT) Description: LTG:  Patient will maintain dynamic standing balance with assist during activities of daily living (OT)  Flowsheets (Taken 04/09/2024 1618) LTG: Pt will maintain dynamic standing balance during ADLs with: Independent with assistive device   Problem: RH Grooming Goal: LTG Patient will perform grooming w/assist,cues/equip (OT) Description: LTG: Patient will perform grooming with assist, with/without cues using equipment (OT) Flowsheets (Taken 04/09/2024 1618) LTG: Pt will perform grooming with assistance level of: Independent with assistive device    Problem: RH Bathing Goal: LTG Patient will bathe all body parts with assist levels (OT) Description: LTG: Patient will bathe all body parts with assist levels (OT) Flowsheets (Taken 04/09/2024 1618) LTG: Pt will perform bathing with assistance level/cueing: Independent with assistive device    Problem: RH Dressing Goal: LTG Patient will perform upper body dressing (OT) Description: LTG Patient will perform upper body dressing with assist, with/without cues (OT). Flowsheets (Taken 04/09/2024 1618) LTG: Pt will perform upper body dressing with assistance level of: Independent with assistive device Goal: LTG Patient will perform lower body dressing w/assist (OT) Description: LTG: Patient will perform lower body dressing with assist, with/without cues in positioning using equipment (OT) Flowsheets (Taken 04/09/2024 1618) LTG: Pt will perform lower body dressing with assistance level of: Independent with assistive device   Problem: RH Toileting Goal: LTG Patient will perform toileting task (3/3 steps) with assistance level (OT) Description: LTG: Patient will perform toileting task (3/3 steps) with assistance level (OT)  Flowsheets (Taken 04/09/2024 1618) LTG: Pt will perform toileting task (3/3 steps) with assistance level: Independent with assistive  device   Problem: RH Toilet Transfers Goal: LTG Patient will perform toilet transfers w/assist (OT) Description: LTG: Patient will perform toilet transfers with assist, with/without cues using equipment (OT) Flowsheets (Taken 04/09/2024 1618) LTG: Pt will perform toilet transfers with assistance level of: Independent with assistive device   Problem: RH Tub/Shower Transfers Goal: LTG Patient will perform tub/shower transfers w/assist (OT) Description: LTG: Patient will perform tub/shower transfers with assist, with/without cues using equipment (OT) Flowsheets (Taken 04/09/2024 1618) LTG: Pt will perform tub/shower stall transfers with assistance level of: Independent with assistive device

## 2024-04-10 DIAGNOSIS — G959 Disease of spinal cord, unspecified: Secondary | ICD-10-CM | POA: Diagnosis not present

## 2024-04-10 DIAGNOSIS — R252 Cramp and spasm: Secondary | ICD-10-CM | POA: Diagnosis not present

## 2024-04-10 DIAGNOSIS — N319 Neuromuscular dysfunction of bladder, unspecified: Secondary | ICD-10-CM | POA: Diagnosis not present

## 2024-04-10 DIAGNOSIS — K592 Neurogenic bowel, not elsewhere classified: Secondary | ICD-10-CM | POA: Diagnosis not present

## 2024-04-10 MED ORDER — DICLOFENAC SODIUM 1 % EX GEL
4.0000 g | Freq: Four times a day (QID) | CUTANEOUS | Status: DC
  Administered 2024-04-10 – 2024-04-22 (×20): 4 g via TOPICAL
  Filled 2024-04-10: qty 100

## 2024-04-10 MED ORDER — WHITE PETROLATUM EX OINT
TOPICAL_OINTMENT | CUTANEOUS | Status: DC | PRN
  Administered 2024-04-10: 0.2 via TOPICAL
  Filled 2024-04-10: qty 28.35

## 2024-04-10 NOTE — Progress Notes (Signed)
 Physical Therapy Session Note  Patient Details  Name: Nathaniel Hobbs MRN: [id number] Date of Birth: 12-04-1961  Today's Date: 04/10/2024 PT Individual Time: 0945-1100 PT Individual Time Calculation (min): 75 min   Short Term Goals: Week 1:  PT Short Term Goal 1 (Week 1): Pt will transfer sup to sit w/ min A PT Short Term Goal 2 (Week 1): Pt will transfer sit to stand w/ CGA PT Short Term Goal 3 (Week 1): Pt will amb w/ RW x 100 and C/S PT Short Term Goal 4 (Week 1): Pt will negotiate stairs w/ 1 rail and min A.  Skilled Therapeutic Interventions/Progress Updates:    Discussed overall progress, goals, functional status PTA/sx, and d/c planning in regards to home environment and accessibility. Extra time for pt to don shoes independently including reaching down to adjust shoe. In standing adjusted fit of L shoe with RW for support. (CGA to get into standing).  NMR to address BLE strength, coordination, and functional mobility and balance during gait with RW x 150' with focus on upright posture, knee flexion on L  (maintained stiff gait throughout), and decreased reliance on UE support.  11 min lap on level 4 with target pace of 50 steps per min (avg 29) on Nustep for NMR for reciprocal movement pattern retraining, and to decrease tightness noted in LLE during initial gait in hallway. Pt took several rest breaks due to fatigue overall.  NMR during gait without device x 15' with overall mod A for facilitation of weightshift and to maintain balance - noted very narrow BOS and decreased weightshift. Practiced transfers throughout session without device for increased balance challenge and coordination with overall min assist needed.   TUG = 1 min 21 sec with RW (Trial 1), 1 min with RW (trial 2) = Avg of 1 min 10sec for fall risk assessment and reviewed results with patient - verbalized understanding.  Returned to room via w/c for time management and performed ambulatory transfer with RW with  overall CGA for balance in room and to recliner. All needs in reach.   Therapy Documentation Precautions:  Precautions Precautions: Fall, Cervical Required Braces or Orthoses: Other Brace Other Brace: soft collar for comfort Restrictions Weight Bearing Restrictions Per Provider Order: No  Pain: Reports pain in neck rating at 3-4/10 - notified nursing for pain medication and given during session.    Therapy/Group: Individual Therapy  [name], PT, DPT, CBIS  04/10/2024, 11:29 AM

## 2024-04-10 NOTE — Progress Notes (Addendum)
 PROGRESS NOTE   Subjective/Complaints:  Patient reports he had a very large but continent bowel movement yesterday.  Muscle relaxers and oxycodone are helping control his overall pain.   ROS: Patient denies fever, new vision changes, dizziness, nausea, vomiting, diarrhea,  shortness of breath or chest pain, headache, or mood change. Bladder urgency- continued + constipation    Objective:   No results found. Recent Labs    04/09/24 0518  WBC 7.3  HGB 13.9  HCT 41.0  PLT 175   Recent Labs    04/09/24 0518  NA 136  K 4.1  CL 104  CO2 27  GLUCOSE 98  BUN 12  CREATININE 0.85  CALCIUM 8.5*    Intake/Output Summary (Last 24 hours) at 04/10/2024 1703 Last data filed at 04/10/2024 1250 Gross per 24 hour  Intake 238 ml  Output 275 ml  Net -37 ml        Physical Exam: Vital Signs Blood pressure 117/67, pulse (!) 59, temperature 98.7 F (37.1 C), temperature source Oral, resp. rate 18, height 5\' 9"  (1.753 m), weight 98 kg, SpO2 95%.   General: No apparent distress HEENT: Head is normocephalic, atraumatic, sclera anicteric, oral mucosa pink and moist Heart: Reg rate and rhythm. No murmurs rubs or gallops Chest: CTA bilaterally without wheezes Abdomen: Soft, non-tender, mildly-distended, bowel sounds positive-normoactive Extremities: 1+ edema LUE and b/l LE Psych: Pt's affect is appropriate. Pt is cooperative Skin: Surgical incision CDI Neuro:     Mental Status: AAOx3,  no apparent cognitive deficits noted CRANIAL NERVES: 2-12 grossly intact     MOTOR: RUE: 4/5 Deltoid, 4/5 Biceps, 4/5 Triceps, 4-/5 Grip LUE: 3/5 Deltoid, 4-/5 Biceps, 4-/5 Triceps, 3/5 Grip RLE: HF 4-/5, KE 4/5, ADF 4-/5, APF 4-/5 LLE: HF 3/5, KE 4-/5, ADF 4-/5, APF 4-/5     REFLEXES: Mild hypertonia left upper extremity and left lower extremity-improved from admission Slight hypertonia right lower extremity   SENSORY: Intact but  altered light touch all 4 extremities   MSK: No joint tenderness/swelling noted.    Assessment/Plan: 1. Functional deficits which require 3+ hours per day of interdisciplinary therapy in a comprehensive inpatient rehab setting. Physiatrist is providing close team supervision and 24 hour management of active medical problems listed below. Physiatrist and rehab team continue to assess barriers to discharge/monitor patient progress toward functional and medical goals  Care Tool:  Bathing              Bathing assist Assist Level: Moderate Assistance - Patient 50 - 74%     Upper Body Dressing/Undressing Upper body dressing   What is the patient wearing?: Pull over shirt    Upper body assist Assist Level: Minimal Assistance - Patient > 75%    Lower Body Dressing/Undressing Lower body dressing      What is the patient wearing?: Pants     Lower body assist Assist for lower body dressing: Maximal Assistance - Patient 25 - 49%     Toileting Toileting    Toileting assist Assist for toileting: Moderate Assistance - Patient 50 - 74%     Transfers Chair/bed transfer  Transfers assist     Chair/bed transfer assist level:  Minimal Assistance - Patient > 75%     Locomotion Ambulation   Ambulation assist      Assist level: Minimal Assistance - Patient > 75% Assistive device: Walker-rolling Max distance: 150'   Walk 10 feet activity   Assist     Assist level: Minimal Assistance - Patient > 75% Assistive device: Walker-rolling   Walk 50 feet activity   Assist    Assist level: Minimal Assistance - Patient > 75% Assistive device: Walker-rolling    Walk 150 feet activity   Assist Walk 150 feet activity did not occur: Safety/medical concerns  Assist level: Minimal Assistance - Patient > 75% Assistive device: Walker-rolling    Walk 10 feet on uneven surface  activity   Assist     Assist level: Minimal Assistance - Patient > 75% Assistive  device: Walker-rolling   Wheelchair     Assist Is the patient using a wheelchair?: Yes (Per PT documentation) Type of Wheelchair: Manual    Wheelchair assist level: Dependent - Patient 0%      Wheelchair 50 feet with 2 turns activity    Assist        Assist Level: Dependent - Patient 0%   Wheelchair 150 feet activity     Assist      Assist Level: Dependent - Patient 0%   Blood pressure 117/67, pulse (!) 59, temperature 98.7 F (37.1 C), temperature source Oral, resp. rate 18, height 5\' 9"  (1.753 m), weight 98 kg, SpO2 95%.   Medical Problem List and Plan: 1. Functional deficits secondary to cervical stenosis/myelopathy status post ACDF C3-5 04/03/2024             -patient may shower, cover incision please             -ELOS/Goals: 14-16 days, PT/OT supervision/Mod I             -Admit to CIR             -PFRAOs b/l 2.  Antithrombotics: -DVT/anticoagulation:  Pharmaceutical: Lovenox.  Check vascular             -antiplatelet therapy: Aspirin 81 mg daily 3. Pain Management: Neurontin 400 mg daily, Robaxin 500 mg every 6 hours as needed, oxycodone  as needed  4/17therapy reporting patient has been having some left knee pain from chronic arthritis, will start Voltaren  Order K-pad 4. Mood/Behavior/Sleep: Provide emotional support             -antipsychotic agents: N/A 5. Neuropsych/cognition: This patient is capable of making decisions on his own behalf. 6. Skin/Wound Care: Routine skin checks 7. Fluids/Electrolytes/Nutrition: Routine in and outs with follow-up chemistries 8.  Constipation vs Neurogenic bowel              - Scheduled MiraLAX twice daily and continue Senokot.  Consider suppository bowel program if not having regular Bms  -Pt doesn't want to try suppository, will do sorbitol 60ml for now  -4/17 patient had very large bowel movement yesterday, continent.  Continue MiraLAX and Senokot and monitor. 9.  Hypertension.  Vasotec 10 mg twice daily.   Monitor with increased mobility             -4/17 controlled overall continue to monitor      04/10/2024    1:47 PM 04/10/2024    4:48 AM 04/09/2024    8:01 PM  Vitals with BMI  Systolic 117 153 409  Diastolic 67 74 72  Pulse 59 68 63    10.  CAD status post PCI 2000 08/2012.  Follow-up with cardiology services Dr. Burney Carter. Denies CP 11.  Hyperlipidemia.  Lipitor 12.  Obesity.  BMI 34.18.  Dietary follow-up 13. Possible Neurogenic bladder             -PVR/bladder scans IC if greater than 350  - 4/16 Will ask nursing check bladder scan/PVR  -4/17 patient did have a bladder scan yesterday to 12, will asked nursing to try to check PVRs 14) Spasticity - Will start baclofen 5 mg 3 times daily -4/16-7 improved continue current abdomen monitor  LOS: 2 days A FACE TO FACE EVALUATION WAS PERFORMED  Lylia Sand 04/10/2024, 5:03 PM

## 2024-04-10 NOTE — Progress Notes (Signed)
 Occupational Therapy Session Note  Patient Details  Name: Nathaniel Hobbs MRN: [id number] Date of Birth: 02-07-1961  Today's Date: 04/10/2024 OT Individual Time: 1130-1200 & 1430-1532 OT Individual Time Calculation (min): 30 min & 62 min   Short Term Goals: Week 1:  OT Short Term Goal 1 (Week 1): Pt will complete LB dressing with Min A with AE as necessary OT Short Term Goal 2 (Week 1): Pt will complete toilet ttransfer at [facility] with AE as necessary OT Short Term Goal 3 (Week 1): Pt will complete UB dressing with CGA  Skilled Therapeutic Interventions/Progress Updates:      Therapy Documentation Precautions:  Precautions Precautions: Fall, Cervical Required Braces or Orthoses: Other Brace Other Brace: soft collar for comfort Restrictions Weight Bearing Restrictions Per Provider Order: No Session 1 General: "I feel good today!" Pt seated in recliner upon OT arrival, agreeable to OT.  Pain: no pain reported  Exercises: Pt completed the following exercise circuit in order to improve functional activity, strength and endurance to prepare for ADLs such as bathing. Pt completed the following exercises in seated position with no noted LOB/SOB and various repetitions on each exercise: - leg press with red theraband 2x10 -led abduction with red theraband 2x10 -seated marches with arm swing incorporated 2x20   Other Treatments: OT providing pt education on ataxia and encouraging hand manipulation of everyday items such as foods, coins, etc to increase FMC of BUE.   Pt seated in recliner at end of session with W/C alarm donned, call light within reach and 4Ps assessed.    Session 2 General: "Thank you!" Pt seated in recliner upon OT arrival, agreeable to OT.  Pain: no pain reported  ADL: OT providing skilled intervention on ADL retraining in order to increase independence with tasks and increase activity tolerance. Pt completed the following tasks at the current level of assist: UB  dressing: SBA with increased time, OT provided education for dressing weaker side first for increased independence with good carryover of education LB dressing: Min A, required assistance for management over waist /t decreased sensation in hands Footwear: total A for donning/doffing socks, OT educated pt on Radiation protection practitioner aide for home for increased independence with task Shower transfer: CGA with RW ambulating from recliner ~25 ft recliner><TTB Bathing: Min A, required assistance with buttocks d/t decreased sensation in hands, able to wash/rinse/dry all other areas Transfers: Pt completed sit to stand and short bout of ambulation from W/C><recliner at Min A without AD and increased time   Exercises: Pt participated in gross motor activity with BUE, throwing/catching ball with BUE for increased gross motor coordination and ataxia management in order to complete ADLs. Pt able to reach out of BOS for catching without LOB. OT instructed to throw underhand and overhand with slight difficulty with timing. Pt completed task with PRN rest breaks d/t increased fatigue in LUE.    Other Treatments: OT messaged care team for k-pad and possible voltarin gel for decreased stiffness in lt knee d/t pt arthritis.   Pt seated in recliner end of session with W/C alarm donned, call light within reach and 4Ps assessed.    Therapy/Group: Individual Therapy  Nila Barth, OTD, OTR/L 04/10/2024, 4:00 PM

## 2024-04-10 NOTE — Plan of Care (Signed)
  Problem: Consults Goal: RH SPINAL CORD INJURY PATIENT EDUCATION Description:  See Patient Education module for education specifics.  Outcome: Progressing   Problem: SCI BOWEL ELIMINATION Goal: RH STG MANAGE BOWEL WITH ASSISTANCE Description: STG Manage Bowel with mod I  Assistance. Outcome: Progressing Goal: RH STG SCI MANAGE BOWEL WITH MEDICATION WITH ASSISTANCE Description: STG SCI Manage bowel with medication with mod I assistance. Outcome: Progressing   Problem: RH SAFETY Goal: RH STG ADHERE TO SAFETY PRECAUTIONS W/ASSISTANCE/DEVICE Description: STG Adhere to Safety Precautions With  cues Assistance/Device. Outcome: Progressing   Problem: RH PAIN MANAGEMENT Goal: RH STG PAIN MANAGED AT OR BELOW PT'S PAIN GOAL Description: Manage pain < 4 with prns Outcome: Progressing   Problem: RH KNOWLEDGE DEFICIT SCI Goal: RH STG INCREASE KNOWLEDGE OF SELF CARE AFTER SCI Description: Patient and Spouse will be able to manage care at discharge using educational resources independently Outcome: Progressing

## 2024-04-10 NOTE — Progress Notes (Signed)
 Physical Therapy Session Note  Patient Details  Name: Nathaniel Hobbs MRN: [id number] Date of Birth: [birth year]  Today's Date: 04/10/2024 PT Individual Time: 1300-1330 PT Individual Time Calculation (min): 30 min   Short Term Goals: Week 1:  PT Short Term Goal 1 (Week 1): Pt will transfer sup to sit w/ min A PT Short Term Goal 2 (Week 1): Pt will transfer sit to stand w/ CGA PT Short Term Goal 3 (Week 1): Pt will amb w/ RW x 100 and C/S PT Short Term Goal 4 (Week 1): Pt will negotiate stairs w/ 1 rail and min A.  Skilled Therapeutic Interventions/Progress Updates:   Pt asleep upon arrival but easily awakened and agreeable to session. Functional gait with RW in the room to access bathroom and performing toileting in standing (supervision) with CGA for gait in/out of bathroom due to uneven surface.   NMR to focus on strength, coordination, balance ,and transitional movements:  blocked practice sit <> stands with focus on technique, weightshift and eccentric control dynamic standing balance with UE support for toe taps x 10 on R and L, and then alternating x 10 reps each  Dynamic standing balance activity to place and remove squiggz on mirror with LUE for fine motor control task  Pt performed functional transfers with overall CGA to min assist with RW- intially pt with more rigid transitions but progressed to CGA as mobility increased. Returned to recliner at end of session.   Therapy Documentation Precautions:  Precautions Precautions: Fall, Cervical Required Braces or Orthoses: Other Brace Other Brace: soft collar for comfort Restrictions Weight Bearing Restrictions Per Provider Order: (P) No  Pain: No complaints of pain this session except intermittent discomfort in neck - rest breaks and repositioned.      Therapy/Group: Individual Therapy  [name], PT, DPT, CBIS  04/10/2024, 2:42 PM

## 2024-04-10 NOTE — Progress Notes (Signed)
 Met with patient to review current status, team conference and plan of care. Reviewed pain medications, incision care, safety. Continue to follow along to discharge to provide educational needs to facilitate discharge.

## 2024-04-11 ENCOUNTER — Inpatient Hospital Stay (HOSPITAL_COMMUNITY)

## 2024-04-11 DIAGNOSIS — G959 Disease of spinal cord, unspecified: Secondary | ICD-10-CM | POA: Diagnosis not present

## 2024-04-11 DIAGNOSIS — K592 Neurogenic bowel, not elsewhere classified: Secondary | ICD-10-CM | POA: Diagnosis not present

## 2024-04-11 DIAGNOSIS — R252 Cramp and spasm: Secondary | ICD-10-CM | POA: Diagnosis not present

## 2024-04-11 DIAGNOSIS — N319 Neuromuscular dysfunction of bladder, unspecified: Secondary | ICD-10-CM | POA: Diagnosis not present

## 2024-04-11 MED ORDER — POLYETHYLENE GLYCOL 3350 17 G PO PACK
17.0000 g | PACK | Freq: Every day | ORAL | Status: DC | PRN
  Administered 2024-04-11: 17 g via ORAL
  Filled 2024-04-11: qty 1

## 2024-04-11 MED ORDER — BISACODYL 5 MG PO TBEC
10.0000 mg | DELAYED_RELEASE_TABLET | Freq: Once | ORAL | Status: AC
  Administered 2024-04-11: 10 mg via ORAL
  Filled 2024-04-11: qty 2

## 2024-04-11 NOTE — Progress Notes (Signed)
 Occupational Therapy Session Note  Patient Details  Name: Nathaniel Hobbs MRN: [id number] Date of Birth: 09-14-1961  Today's Date: 04/11/2024 OT Individual Time: 1045-1200 OT Individual Time Calculation (min): 75 min    Short Term Goals: Week 1:  OT Short Term Goal 1 (Week 1): Pt will complete LB dressing with Min A with AE as necessary OT Short Term Goal 2 (Week 1): Pt will complete toilet ttransfer at [facility] with AE as necessary OT Short Term Goal 3 (Week 1): Pt will complete UB dressing with CGA  Skilled Therapeutic Interventions/Progress Updates:   Pt received supine with no c/o pain, agreeable to OT session. He requested to start with shower. Functional mobility to the sink with min A. He sat in the w/c to complete oral hygiene. He required increased time to coordinate BUE dexterity to load toothpaste and unscrew cap. He completed transfer into walk in shower with min A and min cueing for sequencing. He required mod A to doff socks- discussed AE use. He completed UB bathing with (S), dropping washcloth x3. He stood and required mod A to cleanse bottom d/t decreased IR/proprioception of the BUE. He returned to the w/c with min A. He donned a new shirt with (S). He required min cueing and reacher use and then was able to thread BLE into pants. He required mod A to pull up posteriorly. He showed OT swollen feet- donned ted hose and encouraged pt to elevate them in seated. He completed 100 ft of functional mobility with min-occasional mod A for dynamic balance. He reported onset of dizziness- BP assessed and 147/80. Seated rest break and then the dizziness resolved. He was able to complete another 100 ft of mobility- performed to improve dynamic standing balance and functional activity tolerance. Pt reporting high fatigue following. Ended with seated level BUE dexterity task- grasp and flipping cards to challenge dynamic pincer grasp. He returned to his room following. Pt was left sitting up in the  recliner with all needs met, chair alarm set, and call bell within reach.   Therapy Documentation Precautions:  Precautions Precautions: Fall, Cervical Required Braces or Orthoses: Other Brace Other Brace: soft collar for comfort Restrictions Weight Bearing Restrictions Per Provider Order: No   Therapy/Group: Individual Therapy  Una Ganser 04/11/2024, 6:37 AM

## 2024-04-11 NOTE — IPOC Note (Signed)
 Overall Plan of Care [facility]) Patient Details Name: Nathaniel Hobbs MRN: [id number] DOB: July 01, 1961  Admitting Diagnosis: Cervical myelopathy [facility]  Hospital Problems: Principal Problem:   Cervical myelopathy (HCC)     Functional Problem List: Nursing Bladder, Bowel, Edema, Endurance, Medication Management, Pain, Safety  PT Balance, Safety, Edema, Endurance, Motor, Pain  OT Balance, Safety, Sensory, Skin Integrity, Edema, Endurance, Motor, Pain  SLP    TR         Basic ADL's: OT Grooming, Bathing, Dressing, Toileting     Advanced  ADL's: OT       Transfers: PT Bed Mobility, Bed to Chair, Car, Occupational psychologist, Research scientist (life sciences): PT Ambulation, Stairs     Additional Impairments: OT Fuctional Use of Upper Extremity  SLP        TR      Anticipated Outcomes Item Anticipated Outcome  Self Feeding mod I  Swallowing      Basic self-care  mod I  Toileting  mod I   Bathroom Transfers mod I  Bowel/Bladder  patient will be continent of bowel and bladder with min assist  Transfers  mod I  Locomotion  Mod I  Communication     Cognition     Pain  pain will be equal to or less than 4/10 with medi ation  Safety/Judgment  patient will be free from falls/injury and displaying sound safety judgement   Therapy Plan: PT Intensity: Minimum of 1-2 x/day ,45 to 90 minutes PT Frequency: 5 out of 7 days PT Duration Estimated Length of Stay: 12-14 days OT Intensity: Minimum of 1-2 x/day, 45 to 90 minutes OT Frequency: 5 out of 7 days OT Duration/Estimated Length of Stay: 12-14 days     Team Interventions: Nursing Interventions Patient/Family Education, Bowel Management, Pain Management, Skin Care/Wound Management, Discharge Planning  PT interventions Ambulation/gait training, Community reintegration, Neuromuscular re-education, Stair training, UE/LE Strength taining/ROM, Therapeutic Activities, UE/LE Coordination activities, Pain management, Discharge  planning, Balance/vestibular training, Functional mobility training, Therapeutic Exercise  OT Interventions Balance/vestibular training, Discharge planning, Pain management, Self Care/advanced ADL retraining, Therapeutic Activities, UE/LE Coordination activities, Therapeutic Exercise, Skin care/wound managment, Patient/family education, Functional mobility training, Disease mangement/prevention, Cognitive remediation/compensation, Community reintegration, Fish farm manager, Neuromuscular re-education, Psychosocial support, Splinting/orthotics, UE/LE Strength taining/ROM, Wheelchair propulsion/positioning  SLP Interventions    TR Interventions    SW/CM Interventions Discharge Planning, Psychosocial Support, Patient/Family Education   Barriers to Discharge MD  Medical stability and Neurogenic bowel and bladder  Nursing Decreased caregiver support, Home environment access/layout 1 level 1 ste no rail w spouse; who works but will take [employer] if needed  PT Home environment Best boy    OT      SLP      SW Insurance for SNF coverage     Team Discharge Planning: Destination: PT-Home ,OT- Home , SLP-  Projected Follow-up: PT-Outpatient PT, OT-  Outpatient OT, SLP-  Projected Equipment Needs: PT-To be determined, OT- To be determined, SLP-  Equipment Details: PT-pt has SPC only at home, OT-  Patient/family involved in discharge planning: PT- Patient,  OT-Patient, SLP-   MD ELOS: 12-14 Medical Rehab Prognosis:  Excellent Assessment: The patient has been admitted for CIR therapies with the diagnosis of Cervical stenosis/myelopathy status post ACDF C3-5 04/03/2024 . The team will be addressing functional mobility, strength, stamina, balance, safety, adaptive techniques and equipment, self-care, bowel and bladder mgt, patient and caregiver education. Goals have been set at Mod I. Anticipated discharge destination is home.  See Team Conference Notes for weekly updates to  the plan of care

## 2024-04-11 NOTE — Plan of Care (Signed)
  Problem: Consults Goal: RH SPINAL CORD INJURY PATIENT EDUCATION Description:  See Patient Education module for education specifics.  Outcome: Progressing   Problem: SCI BOWEL ELIMINATION Goal: RH STG MANAGE BOWEL WITH ASSISTANCE Description: STG Manage Bowel with mod I  Assistance. Outcome: Progressing Goal: RH STG SCI MANAGE BOWEL WITH MEDICATION WITH ASSISTANCE Description: STG SCI Manage bowel with medication with mod I assistance. Outcome: Progressing   Problem: RH SAFETY Goal: RH STG ADHERE TO SAFETY PRECAUTIONS W/ASSISTANCE/DEVICE Description: STG Adhere to Safety Precautions With  cues Assistance/Device. Outcome: Progressing   Problem: RH PAIN MANAGEMENT Goal: RH STG PAIN MANAGED AT OR BELOW PT'S PAIN GOAL Description: Manage pain < 4 with prns Outcome: Progressing   Problem: RH KNOWLEDGE DEFICIT SCI Goal: RH STG INCREASE KNOWLEDGE OF SELF CARE AFTER SCI Description: Patient and Spouse will be able to manage care at discharge using educational resources independently Outcome: Progressing

## 2024-04-11 NOTE — Progress Notes (Addendum)
 PROGRESS NOTE   Subjective/Complaints:  No acute events noted overnight.  Has not had BM since he had sorbitol  about 2 days ago.  Denies bladder incontinence/dysuria, reports urine is more clear now.  Reports his overall pain is doing much better. He reports dulcolax oral has worked well for him in the past.   ROS: Patient denies fever, new vision changes, dizziness, nausea, vomiting, diarrhea,  shortness of breath or chest pain, headache, or mood change. Bladder urgency- continued + constipation -improved   Objective:   No results found. Recent Labs    04/09/24 0518  WBC 7.3  HGB 13.9  HCT 41.0  PLT 175   Recent Labs    04/09/24 0518  NA 136  K 4.1  CL 104  CO2 27  GLUCOSE 98  BUN 12  CREATININE 0.85  CALCIUM  8.5*    Intake/Output Summary (Last 24 hours) at 04/11/2024 1230 Last data filed at 04/11/2024 1478 Gross per 24 hour  Intake 357 ml  Output 300 ml  Net 57 ml        Physical Exam: Vital Signs Blood pressure (!) 154/69, pulse 64, temperature 98.8 F (37.1 C), temperature source Oral, resp. rate 17, height 5\' 9"  (1.753 m), weight 98 kg, SpO2 95%.   General: No apparent distress HEENT: Head is normocephalic, atraumatic, sclera anicteric, oral mucosa pink and moist Heart: Reg rate and rhythm. No murmurs rubs or gallops Chest: CTA bilaterally without wheezes Abdomen: Soft, non-tender, mildly-distended, bowel sounds positive-normoactive Extremities: 1+ edema LUE and b/l LE Psych: Pt's affect is appropriate. Pt is cooperative Skin: Surgical incision CDI Neuro:     Mental Status: AAOx3,  no apparent cognitive deficits noted CRANIAL NERVES: 2-12 grossly intact     MOTOR: RUE: 4/5 Deltoid, 4/5 Biceps, 4/5 Triceps, 4-/5 Grip LUE: 3/5 Deltoid, 4-/5 Biceps, 4-/5 Triceps, 3/5 Grip RLE: HF 4-/5, KE 4/5, ADF 4-/5, APF 4-/5 LLE: HF 3/5, KE 4-/5, ADF 4-/5, APF 4-/5      Left upper extremity MAS 2  elbow flexors and finger flexors, knee flexors MAS 1+-2  SENSORY: Intact but altered light touch all 4 extremities   MSK: No joint tenderness/swelling noted.    Assessment/Plan: 1. Functional deficits which require 3+ hours per day of interdisciplinary therapy in a comprehensive inpatient rehab setting. Physiatrist is providing close team supervision and 24 hour management of active medical problems listed below. Physiatrist and rehab team continue to assess barriers to discharge/monitor patient progress toward functional and medical goals  Care Tool:  Bathing              Bathing assist Assist Level: Moderate Assistance - Patient 50 - 74%     Upper Body Dressing/Undressing Upper body dressing   What is the patient wearing?: Pull over shirt    Upper body assist Assist Level: Minimal Assistance - Patient > 75%    Lower Body Dressing/Undressing Lower body dressing      What is the patient wearing?: Pants     Lower body assist Assist for lower body dressing: Maximal Assistance - Patient 25 - 49%     Toileting Toileting    Toileting assist Assist for toileting: Moderate Assistance -  Patient 50 - 74%     Transfers Chair/bed transfer  Transfers assist     Chair/bed transfer assist level: Minimal Assistance - Patient > 75%     Locomotion Ambulation   Ambulation assist      Assist level: Minimal Assistance - Patient > 75% Assistive device: Walker-rolling Max distance: 150'   Walk 10 feet activity   Assist     Assist level: Minimal Assistance - Patient > 75% Assistive device: Walker-rolling   Walk 50 feet activity   Assist    Assist level: Minimal Assistance - Patient > 75% Assistive device: Walker-rolling    Walk 150 feet activity   Assist Walk 150 feet activity did not occur: Safety/medical concerns  Assist level: Minimal Assistance - Patient > 75% Assistive device: Walker-rolling    Walk 10 feet on uneven surface   activity   Assist     Assist level: Minimal Assistance - Patient > 75% Assistive device: Development worker, international aid     Assist Is the patient using a wheelchair?: Yes (Per PT documentation) Type of Wheelchair: Manual    Wheelchair assist level: Dependent - Patient 0%      Wheelchair 50 feet with 2 turns activity    Assist        Assist Level: Dependent - Patient 0%   Wheelchair 150 feet activity     Assist      Assist Level: Dependent - Patient 0%   Blood pressure (!) 154/69, pulse 64, temperature 98.8 F (37.1 C), temperature source Oral, resp. rate 17, height 5\' 9"  (1.753 m), weight 98 kg, SpO2 95%.   Medical Problem List and Plan: 1. Functional deficits secondary to cervical stenosis/myelopathy status post ACDF C3-5 04/03/2024             -patient may shower, cover incision please             -ELOS/Goals: 14-16 days, PT/OT supervision/Mod I             -Continue CIR             -PFRAOs b/l 2.  Antithrombotics: -DVT/anticoagulation:  Pharmaceutical: Lovenox .  Check vascular             -antiplatelet therapy: Aspirin  81 mg daily 3. Pain Management: Neurontin  400 mg daily, Robaxin  500 mg every 6 hours as needed, oxycodone   as needed  4/17therapy reporting patient has been having some left knee pain from chronic arthritis, will start Voltaren   Order K-pad  4/18 neck pain and knee pain much improved today, continue current regimen 4. Mood/Behavior/Sleep: Provide emotional support             -antipsychotic agents: N/A 5. Neuropsych/cognition: This patient is capable of making decisions on his own behalf. 6. Skin/Wound Care: Routine skin checks 7. Fluids/Electrolytes/Nutrition: Routine in and outs with follow-up chemistries 8.  Constipation vs Neurogenic bowel              - Scheduled MiraLAX  twice daily and continue Senokot.  Consider suppository bowel program if not having regular Bms  -Pt doesn't want to try suppository, will do sorbitol  60ml for  now  -4/17 patient had very large bowel movement yesterday, continent.   Continue MiraLAX  and Senokot and monitor. -4/18 LBM was after sorbitol - continent but was was liquid after this medication, will check abdominal xray to assess stool burden.  Will order dulcolax capsule today, pt reports this has worked will for him in the past. Suspect has component of neurogenic  bowel. Continue suppository at night PRN for now pt resistant to trying.  9.  Hypertension.  Vasotec  10 mg twice daily.  Monitor with increased mobility             -4/17-18 controlled overall continue to monitor      04/11/2024    4:58 AM 04/10/2024    8:10 PM 04/10/2024    1:47 PM  Vitals with BMI  Systolic 154 122 161  Diastolic 69 77 67  Pulse 64 64 59    10.  CAD status post PCI 2000 08/2012.  Follow-up with cardiology services Dr. Burney Carter. Denies CP 11.  Hyperlipidemia.  Lipitor  12.  Obesity.  BMI 34.18.  Dietary follow-up 13. Possible Neurogenic bladder             -PVR/bladder scans IC if greater than 350  - 4/16 Will ask nursing check bladder scan/PVR  -4/17 patient did have a bladder scan yesterday to 12, will asked nursing to try to check PVRs  -4/18 patient had 3 PVRs all under 100, and continent 14) Spasticity - Will start baclofen  5 mg 3 times daily -4/16-8 improved continue current regimen and monitor  LOS: 3 days A FACE TO FACE EVALUATION WAS PERFORMED  Lylia Sand 04/11/2024, 12:30 PM

## 2024-04-11 NOTE — Progress Notes (Signed)
 Occupational Therapy Session Note  Patient Details  Name: Nathaniel Hobbs MRN: [id number] Date of Birth: 12-24-1961  {CHL IP REHAB OT TIME CALCULATIONS:[id number]}   Short Term Goals: Week 1:  OT Short Term Goal 1 (Week 1): Pt will complete LB dressing with Min A with AE as necessary OT Short Term Goal 2 (Week 1): Pt will complete toilet ttransfer at [facility] with AE as necessary OT Short Term Goal 3 (Week 1): Pt will complete UB dressing with CGA  Skilled Therapeutic Interventions/Progress Updates:      Therapy Documentation Precautions:  Precautions Precautions: Fall, Cervical Required Braces or Orthoses: Other Brace Other Brace: soft collar for comfort Restrictions Weight Bearing Restrictions Per Provider Order: No   Therapy/Group: Individual Therapy  Artemus Biles, OTR/L, MSOT  04/11/2024, 6:30 PM

## 2024-04-11 NOTE — Progress Notes (Signed)
 Physical Therapy Session Note  Patient Details  Name: Nathaniel Hobbs MRN: [id number] Date of Birth: 05/08/1961  Today's Date: 04/11/2024 PT Individual Time: 0900-1000 and 1402-1500 PT Individual Time Calculation (min): 60 min and 58 min.  Short Term Goals: Week 1:  PT Short Term Goal 1 (Week 1): Pt will transfer sup to sit w/ min A PT Short Term Goal 2 (Week 1): Pt will transfer sit to stand w/ CGA PT Short Term Goal 3 (Week 1): Pt will amb w/ RW x 100 and C/S PT Short Term Goal 4 (Week 1): Pt will negotiate stairs w/ 1 rail and min A.  Skilled Therapeutic Interventions/Progress Updates:   First session:  Pt presents sitting in recliner and agreeable to therapy.  Pt transfers sit to stand w/ CGA.  Pt amb x 150' to dayroom w/ min A and cues for foot clearance L.  Pt performed toe taps to 3 3/4 platform w/ cues for speed and L knee flexion.  Pt then performed w/ 3# to L ankle and mirror for visual cueing.  Pt performed blocks of sit to stand w/ 2" platform under R foot for increased NMR to L.  Pt amb to room w/ min A and cues for glut activation to L w/ WB.  Pt returned to recliner w/ seat alarm on and all needs in reach.  Pt received Voltaren  gel to L knee w/ good relief.  Second session:  Pt presents sitting in recliner asleep and when arouses, agreeable to therapy.  Pt transfers sit to stand w/ CGA and improving placement and grip of L hand.  Pt amb to w/c w/ min/CGA.  Pt wheeled outside for change of scenery and then gait on uneven surfaces.  Pt amb multiple trials of at least 70' w/ min /CGA.  Pt transferring sit<> stand from flat seat, and bench w/ CGA and increased time for L hand placement.  Pt had no LOB but verbal cueing for turning as well as walker management on uneven surfaces. Pt returned to inside and [place] placed on L hand grip of walker as well as upper and lower arm rests of w/c to improve grip and use of LUE when transferring.  Pt amb back to recliner w/ seat alarm on and all needs  in reach.     Therapy Documentation Precautions:  Precautions Precautions: Fall, Cervical Required Braces or Orthoses: Other Brace Other Brace: soft collar for comfort Restrictions Weight Bearing Restrictions Per Provider Order: No General:   Vital Signs:   Pain:3/10 both sessions. Pain Assessment Pain Scale: 0-10 Pain Score: 7  Pain Location: Shoulder    Therapy/Group: Individual Therapy  Francie Keeling P Dajai Wahlert 04/11/2024, 10:01 AM

## 2024-04-12 DIAGNOSIS — G959 Disease of spinal cord, unspecified: Secondary | ICD-10-CM | POA: Diagnosis not present

## 2024-04-12 MED ORDER — SENNA 8.6 MG PO TABS
3.0000 | ORAL_TABLET | Freq: Every day | ORAL | Status: DC
  Administered 2024-04-13 – 2024-04-17 (×5): 25.8 mg via ORAL
  Filled 2024-04-12 (×5): qty 3

## 2024-04-12 MED ORDER — BACLOFEN 10 MG PO TABS
10.0000 mg | ORAL_TABLET | Freq: Three times a day (TID) | ORAL | Status: DC
  Administered 2024-04-12 – 2024-04-14 (×8): 10 mg via ORAL
  Filled 2024-04-12 (×8): qty 1

## 2024-04-12 NOTE — Progress Notes (Signed)
 Physical Therapy Session Note  Patient Details  Name: Nathaniel Hobbs MRN: [id number] Date of Birth: [birth year]  Today's Date: 04/12/2024 PT Individual Time: 0865-7846 + 1300-1410 PT Individual Time Calculation (min): 70 min + 70 min  Short Term Goals: Week 1:  PT Short Term Goal 1 (Week 1): Pt will transfer sup to sit w/ min A PT Short Term Goal 2 (Week 1): Pt will transfer sit to stand w/ CGA PT Short Term Goal 3 (Week 1): Pt will amb w/ RW x 100 and C/S PT Short Term Goal 4 (Week 1): Pt will negotiate stairs w/ 1 rail and min A.  Skilled Therapeutic Interventions/Progress Updates:    Session 1: Chart reviewed and pt agreeable to therapy. Pt received seated in recliner with 1/10 c/o pain in neck. Session focused on amb,  LLE NMR, and balance to promote safe mobility for home access. Pt initiated session with amb of 288ft to therapy gym using CGA + RW. Pt then completed blocked practice on NuStep of interval training at workloads ranging 1-9. Pt given VC for medial/lateral knee stability during exercise. Pt then completed continued practice on NuStep of single leg/arm mobility using LLE and RUE for NMR at levels 1-4, followed by BUE mobility only at workload 1-2. Pt then completed blocked practice of amb with no AD and CGA + HHA to promote balance during amb. Session education emphasized care continuum. At end of session, pt was left seated in recliner with alarm engaged, nurse call bell and all needs in reach.  Session 2: Chart reviewed and pt agreeable to therapy. Pt received seated in recliner with 2/10 c/o pain in neck. Session focused on stair navigation and amb practice to promote home access. Pt amb to toilet at start of session using CGA + no AD and required CGA during self-care. Pt transferred to therapy gym for time. Pt initiated session with blocked practice of 6" steps using CGA + B rails. Pt completed 3x4 steps. Pt then practiced side stepping up/down steps using L ascending rail per  home set up using minA + rails for 3 rounds. Pt reported high fatigue in LLE and returned to room via WC. In room, pt then completed balance exercises of standing with eyes closed and neutral stance using CGA + no AD. At end of session, pt was left seated in recliner with alarm engaged, nurse call bell and all needs in reach.       Therapy Documentation Precautions:  Precautions Precautions: Fall, Cervical Required Braces or Orthoses: Other Brace Other Brace: soft collar for comfort Restrictions Weight Bearing Restrictions Per Provider Order: (P) No General:      Therapy/Group: Individual Therapy  Hazeline Lister 04/12/2024, 11:00 AM

## 2024-04-12 NOTE — Progress Notes (Signed)
 PROGRESS NOTE   Subjective/Complaints:  Nathaniel Hobbs reports working hard with therapy.  Needed to void immediately when I entered room- NT helped him- had blow out last night and pain almost subsided per Nathaniel Hobbs.   Having al ot of stiffness and jerking in L side >R side, but both bothersome.  ROS:  Nathaniel Hobbs denies SOB, abd pain, CP, N/V/C/D, and vision changes  Bladder urgency- continued + constipation -improved   Objective:   DG Abd 2 Views Result Date: 04/11/2024 CLINICAL DATA:  Constipation for several days EXAM: ABDOMEN - 2 VIEW COMPARISON:  None Available. FINDINGS: Nonobstructed bowel-gas pattern. Moderate stool in the right colon. There is no evidence of free air. Bilateral hip arthroplasties. IMPRESSION: 1. Nonobstructed bowel-gas pattern. Moderate stool in the right colon. Electronically Signed   By: Rozell Cornet M.D.   On: 04/11/2024 22:06   No results for input(s): "WBC", "HGB", "HCT", "PLT" in the last 72 hours.  No results for input(s): "NA", "K", "CL", "CO2", "GLUCOSE", "BUN", "CREATININE", "CALCIUM " in the last 72 hours.   Intake/Output Summary (Last 24 hours) at 04/12/2024 1906 Last data filed at 04/12/2024 1347 Gross per 24 hour  Intake 0 ml  Output 250 ml  Net -250 ml        Physical Exam: Vital Signs Blood pressure 115/73, pulse 62, temperature 98.4 F (36.9 C), temperature source Oral, resp. rate 16, height 5\' 9"  (1.753 m), weight 98 kg, SpO2 96%.    General: awake, alert, appropriate, sitting up in bedside chair; just voiding with NT; NAD HENT: conjugate gaze; oropharynx moist CV: regular rate and rhythm; no JVD Pulmonary: CTA B/L; no W/R/R- good air movement GI: soft, NT, ND, (+)BS Psychiatric: appropriate Neurological: Ox3 Significant spasticity- LUE MAS of 2-3 and sustained clonus in LLE Extremities: 1+ edema LUE and no LE edema this AM Psych: Nathaniel Hobbs's affect is appropriate. Nathaniel Hobbs is cooperative Skin: Surgical  incision CDI Neuro:     Mental Status: AAOx3,  no apparent cognitive deficits noted CRANIAL NERVES: 2-12 grossly intact     MOTOR: RUE: 4/5 Deltoid, 4/5 Biceps, 4/5 Triceps, 4-/5 Grip LUE: 3/5 Deltoid, 4-/5 Biceps, 4-/5 Triceps, 3/5 Grip RLE: HF 4-/5, KE 4/5, ADF 4-/5, APF 4-/5 LLE: HF 3/5, KE 4-/5, ADF 4-/5, APF 4-/5      Left upper extremity MAS 2 elbow flexors and finger flexors, knee flexors MAS 1+-2  SENSORY: Intact but altered light touch all 4 extremities   MSK: No joint tenderness/swelling noted.    Assessment/Plan: 1. Functional deficits which require 3+ hours per day of interdisciplinary therapy in a comprehensive inpatient rehab setting. Physiatrist is providing close team supervision and 24 hour management of active medical problems listed below. Physiatrist and rehab team continue to assess barriers to discharge/monitor patient progress toward functional and medical goals  Care Tool:  Bathing              Bathing assist Assist Level: Moderate Assistance - Patient 50 - 74%     Upper Body Dressing/Undressing Upper body dressing   What is the patient wearing?: Pull over shirt    Upper body assist Assist Level: Minimal Assistance - Patient > 75%    Lower  Body Dressing/Undressing Lower body dressing      What is the patient wearing?: Pants     Lower body assist Assist for lower body dressing: Maximal Assistance - Patient 25 - 49%     Toileting Toileting    Toileting assist Assist for toileting: Moderate Assistance - Patient 50 - 74%     Transfers Chair/bed transfer  Transfers assist     Chair/bed transfer assist level: Minimal Assistance - Patient > 75%     Locomotion Ambulation   Ambulation assist      Assist level: Minimal Assistance - Patient > 75% Assistive device: Walker-rolling Max distance: 150'   Walk 10 feet activity   Assist     Assist level: Minimal Assistance - Patient > 75% Assistive device: Walker-rolling    Walk 50 feet activity   Assist    Assist level: Minimal Assistance - Patient > 75% Assistive device: Walker-rolling    Walk 150 feet activity   Assist Walk 150 feet activity did not occur: Safety/medical concerns  Assist level: Minimal Assistance - Patient > 75% Assistive device: Walker-rolling    Walk 10 feet on uneven surface  activity   Assist     Assist level: Minimal Assistance - Patient > 75% Assistive device: Development worker, international aid     Assist Is the patient using a wheelchair?: Yes (Per Nathaniel Hobbs documentation) Type of Wheelchair: Manual    Wheelchair assist level: Dependent - Patient 0%      Wheelchair 50 feet with 2 turns activity    Assist        Assist Level: Dependent - Patient 0%   Wheelchair 150 feet activity     Assist      Assist Level: Dependent - Patient 0%   Blood pressure 115/73, pulse 62, temperature 98.4 F (36.9 C), temperature source Oral, resp. rate 16, height 5\' 9"  (1.753 m), weight 98 kg, SpO2 96%.   Medical Problem List and Plan: 1. Functional deficits secondary to cervical stenosis/myelopathy status post ACDF C3-5 04/03/2024             -patient may shower, cover incision please             -ELOS/Goals: 14-16 days, Nathaniel Hobbs/OT supervision/Mod I             -Con't CIR-              -PFRAOs b/l 2.  Antithrombotics: -DVT/anticoagulation:  Pharmaceutical: Lovenox .  Check vascular             -antiplatelet therapy: Aspirin  81 mg daily 3. Pain Management: Neurontin  400 mg daily, Robaxin  500 mg every 6 hours as needed, oxycodone   as needed  4/17therapy reporting patient has been having some left knee pain from chronic arthritis, will start Voltaren   Order K-pad  4/18 neck pain and knee pain much improved today, continue current regimen 4. Mood/Behavior/Sleep: Provide emotional support             -antipsychotic agents: N/A 5. Neuropsych/cognition: This patient is capable of making decisions on his own behalf. 6.  Skin/Wound Care: Routine skin checks 7. Fluids/Electrolytes/Nutrition: Routine in and outs with follow-up chemistries 8.  Constipation vs Neurogenic bowel              - Scheduled MiraLAX  twice daily and continue Senokot.  Consider suppository bowel program if not having regular Bms  -Nathaniel Hobbs doesn't want to try suppository, will do sorbitol  60ml for now  -4/17 patient had very large bowel movement yesterday,  continent.   Continue MiraLAX  and Senokot and monitor. -4/18 LBM was after sorbitol - continent but was was liquid after this medication, will check abdominal xray to assess stool burden.  Will order dulcolax capsule today, Nathaniel Hobbs reports this has worked will for him in the past. 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 Continue suppository at night PRN for now Nathaniel Hobbs resistant to trying. 4/19- had blow out last night- however I discussed with Nathaniel Hobbs about neurogenic bowel as well as constipation from baclofen  playing a role- -will increase Senna to 3 tabs/day since increasing baclofen  9.  Hypertension.  Vasotec  10 mg twice daily.  Monitor with increased mobility             -4/17-19 controlled overall continue to monitor      04/12/2024    2:50 PM 04/12/2024    4:29 AM 04/11/2024    7:20 PM  Vitals with BMI  Systolic 115 136 161  Diastolic 73 71 67  Pulse 62 56 55    10.  CAD status post PCI 2000 08/2012.  Follow-up with cardiology services Dr. Burney Carter. Denies CP 11.  Hyperlipidemia.  Lipitor  12.  Obesity.  BMI 34.18.  Dietary follow-up  4/19- BMI down to 31.91 13. Possible Neurogenic bladder             -PVR/bladder scans IC if greater than 350  - 4/16 Will ask nursing check bladder scan/PVR  -4/17 patient did have a bladder scan yesterday to 12, will asked nursing to try to check PVRs  -4/18 patient had 3 PVRs all under 100, and continent  4/19- will d/c bladder scans 14) Spasticity - Will start baclofen  5 mg 3 times daily -4/16-8 improved continue current regimen and monitor 4/19-  Spasticity sounds like it's progressing- will increase Baclofen  to 10 mg TID  I spent a total of 38   minutes on total care today- >50% coordination of care- due to  D/w Nathaniel Hobbs and NT and nursing about bowels, spasticity and bladder scans  LOS: 4 days A FACE TO FACE EVALUATION WAS PERFORMED  Sala Tague 04/12/2024, 7:06 PM

## 2024-04-13 DIAGNOSIS — G959 Disease of spinal cord, unspecified: Secondary | ICD-10-CM | POA: Diagnosis not present

## 2024-04-13 NOTE — Progress Notes (Signed)
 Occupational Therapy Session Note  Patient Details  Name: Nathaniel Hobbs MRN: [id number] Date of Birth: 01-11-1961  Today's Date: 04/13/2024 OT Individual Time: 4098-1191 OT Individual Time Calculation (min): 44 min    Short Term Goals: Week 1:  OT Short Term Goal 1 (Week 1): Pt will complete LB dressing with Min A with AE as necessary OT Short Term Goal 2 (Week 1): Pt will complete toilet ttransfer at [facility] with AE as necessary OT Short Term Goal 3 (Week 1): Pt will complete UB dressing with CGA  Skilled Therapeutic Interventions/Progress Updates:      Therapy Documentation Precautions:  Precautions Precautions: Fall, Cervical Required Braces or Orthoses: Other Brace Other Brace: soft collar for comfort Restrictions Weight Bearing Restrictions Per Provider Order: No General: "My hands feel better today" Pt seated in recliner upon OT arrival, agreeable to OT.  Pain: no pain reported  ADL: OT providing skilled intervention on ADL retraining in order to increase independence with tasks and increase activity tolerance. Pt completed the following tasks at the current level of assist: Grooming/oral hygiene: CGA standing with unilateral support on sink without RW, pt able to manipulate toothpaste container after 1 attempt with VC for hand-eye coordination, pt pleased with success Toilet transfer: pt standing to void urine with increased time required, using unilateral support from wall in front of him for support Toileting: CGA overall for situation above, able to manage pants and hygiene after voiding urine UB dressing: Min A for UB dressing, able to manage over head this date althoguh required assistance for managing down back LB dressing: CGA with encouragement to complete task, pt reporting he could not complete, although did after attempt, pt pleased with success Footwear: total A for time constraints for grip socks Shower transfer: SBA with RW with increased time ambulating from  chair ~25 ft Bathing: SBA seated on TTB for entirety of shower, lateral leans for posterior hygiene Transfers: pt completed one transfer without RW at Min A with assistance from OT, providing VC for rote movements d/t increased rigidity when ambulating   Pt seated in recliner at end of session with W/C alarm donned, call light within reach and 4Ps assessed.    Therapy/Group: Individual Therapy  Nila Barth, OTD, OTR/L 04/13/2024, 3:33 PM

## 2024-04-13 NOTE — Progress Notes (Signed)
 PROGRESS NOTE   Subjective/Complaints:  Pt reports stiffness/spasticity slightly better, but back to "normal" this AM- explained meds only last 8 hours and then wear off- so we can increase to QID, to occur at night- he's OK with dose right now, but wants to increase on Tuesday (when I next will increase )  Feels like could have BM today- if doesn't wants Dulcolax po to help- doesn't want sorbitol  right now.  Also c/o tightness like seat belt too tight around abd when gets lovenox  and intermittently- explained it's common with SCI.   ROS:   Pt denies SOB, abd pain, CP, N/V/C/D, and vision changes  Bladder urgency- continued + constipation -improved   Objective:   DG Abd 2 Views Result Date: 04/11/2024 CLINICAL DATA:  Constipation for several days EXAM: ABDOMEN - 2 VIEW COMPARISON:  None Available. FINDINGS: Nonobstructed bowel-gas pattern. Moderate stool in the right colon. There is no evidence of free air. Bilateral hip arthroplasties. IMPRESSION: 1. Nonobstructed bowel-gas pattern. Moderate stool in the right colon. Electronically Signed   By: Rozell Cornet M.D.   On: 04/11/2024 22:06   No results for input(s): "WBC", "HGB", "HCT", "PLT" in the last 72 hours.  No results for input(s): "NA", "K", "CL", "CO2", "GLUCOSE", "BUN", "CREATININE", "CALCIUM " in the last 72 hours.   Intake/Output Summary (Last 24 hours) at 04/13/2024 1027 Last data filed at 04/13/2024 0718 Gross per 24 hour  Intake 0 ml  Output --  Net 0 ml        Physical Exam: Vital Signs Blood pressure (!) 129/57, pulse (!) 55, temperature 98.5 F (36.9 C), temperature source Oral, resp. rate 18, height 5\' 9"  (1.753 m), weight 98 kg, SpO2 94%.     General: awake, alert, appropriate, sitting up in bedside chair; NAD HENT: conjugate gaze; oropharynx moist CV: regular rhythm, bradycardic rate; no JVD Pulmonary: CTA B/L; no W/R/R- good air movement GI:  soft, NT, slightly distended- normoactive Psychiatric: appropriate- interactive Neurological: Ox3 Significant spasticity- LUE MAS of 2-3 and sustained clonus in LLE- no change today since meds worn off Extremities: 1+ edema LUE and no LE edema this AM Psych: Pt's affect is appropriate. Pt is cooperative Skin: Surgical incision CDI Neuro:     Mental Status: AAOx3,  no apparent cognitive deficits noted CRANIAL NERVES: 2-12 grossly intact     MOTOR: RUE: 4/5 Deltoid, 4/5 Biceps, 4/5 Triceps, 4-/5 Grip LUE: 3/5 Deltoid, 4-/5 Biceps, 4-/5 Triceps, 3/5 Grip RLE: HF 4-/5, KE 4/5, ADF 4-/5, APF 4-/5 LLE: HF 3/5, KE 4-/5, ADF 4-/5, APF 4-/5      Left upper extremity MAS 2 elbow flexors and finger flexors, knee flexors MAS 1+-2  SENSORY: Intact but altered light touch all 4 extremities   MSK: No joint tenderness/swelling noted.    Assessment/Plan: 1. Functional deficits which require 3+ hours per day of interdisciplinary therapy in a comprehensive inpatient rehab setting. Physiatrist is providing close team supervision and 24 hour management of active medical problems listed below. Physiatrist and rehab team continue to assess barriers to discharge/monitor patient progress toward functional and medical goals  Care Tool:  Bathing  Bathing assist Assist Level: Moderate Assistance - Patient 50 - 74%     Upper Body Dressing/Undressing Upper body dressing   What is the patient wearing?: Pull over shirt    Upper body assist Assist Level: Minimal Assistance - Patient > 75%    Lower Body Dressing/Undressing Lower body dressing      What is the patient wearing?: Pants     Lower body assist Assist for lower body dressing: Maximal Assistance - Patient 25 - 49%     Toileting Toileting    Toileting assist Assist for toileting: Moderate Assistance - Patient 50 - 74%     Transfers Chair/bed transfer  Transfers assist     Chair/bed transfer assist level:  Minimal Assistance - Patient > 75%     Locomotion Ambulation   Ambulation assist      Assist level: Minimal Assistance - Patient > 75% Assistive device: Walker-rolling Max distance: 150'   Walk 10 feet activity   Assist     Assist level: Minimal Assistance - Patient > 75% Assistive device: Walker-rolling   Walk 50 feet activity   Assist    Assist level: Minimal Assistance - Patient > 75% Assistive device: Walker-rolling    Walk 150 feet activity   Assist Walk 150 feet activity did not occur: Safety/medical concerns  Assist level: Minimal Assistance - Patient > 75% Assistive device: Walker-rolling    Walk 10 feet on uneven surface  activity   Assist     Assist level: Minimal Assistance - Patient > 75% Assistive device: Development worker, international aid     Assist Is the patient using a wheelchair?: Yes (Per PT documentation) Type of Wheelchair: Manual    Wheelchair assist level: Dependent - Patient 0%      Wheelchair 50 feet with 2 turns activity    Assist        Assist Level: Dependent - Patient 0%   Wheelchair 150 feet activity     Assist      Assist Level: Dependent - Patient 0%   Blood pressure (!) 129/57, pulse (!) 55, temperature 98.5 F (36.9 C), temperature source Oral, resp. rate 18, height 5\' 9"  (1.753 m), weight 98 kg, SpO2 94%.   Medical Problem List and Plan: 1. Functional deficits secondary to cervical stenosis/myelopathy status post ACDF C3-5 04/03/2024             -patient may shower, cover incision please             -ELOS/Goals: 14-16 days, PT/OT supervision/Mod I             Con't CIR               -PFRAOs b/l 2.  Antithrombotics: -DVT/anticoagulation:  Pharmaceutical: Lovenox .  Check vascular             -antiplatelet therapy: Aspirin  81 mg daily 3. Pain Management: Neurontin  400 mg daily, Robaxin  500 mg every 6 hours as needed, oxycodone   as needed  4/17therapy reporting patient has been having some left  knee pain from chronic arthritis, will start Voltaren   Order K-pad  4/18 neck pain and knee pain much improved today, continue current regimen  4/20- Reports neck pain is tolerable-  4. Mood/Behavior/Sleep: Provide emotional support             -antipsychotic agents: N/A 5. Neuropsych/cognition: This patient is capable of making decisions on his own behalf. 6. Skin/Wound Care: Routine skin checks 7. Fluids/Electrolytes/Nutrition: Routine in and outs with follow-up  chemistries 8.  Constipation vs Neurogenic bowel              - Scheduled MiraLAX  twice daily and continue Senokot.  Consider suppository bowel program if not having regular Bms  -Pt doesn't want to try suppository, will do sorbitol  60ml for now  -4/17 patient had very large bowel movement yesterday, continent.   Continue MiraLAX  and Senokot and monitor. -4/18 LBM was after sorbitol - continent but was was liquid after this medication, will check abdominal xray to assess stool burden.  Will order dulcolax capsule today, pt reports this has worked will for him in the past. 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 Continue suppository at night PRN for now pt resistant to trying. 4/19- had blow out last night- however I discussed with pt about neurogenic bowel as well as constipation from baclofen  playing a role- -will increase Senna to 3 tabs/day since increasing baclofen  4/20- Feels like could have BM this AM- but if doesn't was hoping for another Dulcolax PO- explained increased his Senna, but if needs additional meds, let nursing know 9.  Hypertension.  Vasotec  10 mg twice daily.  Monitor with increased mobility             -4/17-19 controlled overall continue to monitor      04/13/2024    3:11 AM 04/12/2024    8:04 PM 04/12/2024    2:50 PM  Vitals with BMI  Systolic 129 144 161  Diastolic 57 69 73  Pulse 55 57 62    10.  CAD status post PCI 2000 08/2012.  Follow-up with cardiology services Dr. Burney Carter. Denies CP 11.   Hyperlipidemia.  Lipitor  12.  Obesity.  BMI 34.18.  Dietary follow-up  4/19- BMI down to 31.91 13. Possible Neurogenic bladder             -PVR/bladder scans IC if greater than 350  - 4/16 Will ask nursing check bladder scan/PVR  -4/17 patient did have a bladder scan yesterday to 12, will asked nursing to try to check PVRs  -4/18 patient had 3 PVRs all under 100, and continent  4/19- will d/c bladder scans 14) Spasticity - Will start baclofen  5 mg 3 times daily -4/16-8 improved continue current regimen and monitor 4/19- Spasticity sounds like it's progressing- will increase Baclofen  to 10 mg TID  4/20- pt reports is a little better, however really wants to increase as soon as possible- I explained we can increase on Tuesday AM to 15 mg TID   I spent a total of 36   minutes on total care today- >50% coordination of care- due to  D/w pt about spasticity- educated on spasticity, as well as at level SCI pain-  and bowels and how it's related to cervical myelopathy  LOS: 5 days A FACE TO FACE EVALUATION WAS PERFORMED  Tanysha Quant 04/13/2024, 10:27 AM

## 2024-04-14 ENCOUNTER — Telehealth: Payer: Self-pay | Admitting: Neurosurgery

## 2024-04-14 DIAGNOSIS — K592 Neurogenic bowel, not elsewhere classified: Secondary | ICD-10-CM | POA: Diagnosis not present

## 2024-04-14 DIAGNOSIS — G959 Disease of spinal cord, unspecified: Secondary | ICD-10-CM | POA: Diagnosis not present

## 2024-04-14 DIAGNOSIS — I10 Essential (primary) hypertension: Secondary | ICD-10-CM | POA: Diagnosis not present

## 2024-04-14 DIAGNOSIS — N319 Neuromuscular dysfunction of bladder, unspecified: Secondary | ICD-10-CM | POA: Diagnosis not present

## 2024-04-14 NOTE — Telephone Encounter (Signed)
 Patient's wife is calling to see about having a handicap placard filled out for her husband when he gets out of rehab. She states that she would like this emailed to her if possible or if not faxed to her office.   Email: [email or web address] Fax: [phone]

## 2024-04-14 NOTE — Progress Notes (Signed)
 Occupational Therapy Session Note  Patient Details  Name: Nathaniel Hobbs MRN: [id number] Date of Birth: Apr 27, 1961  Today's Date: 04/14/2024 OT Individual Time: 1300-1425 OT Individual Time Calculation (min): 85 min    Short Term Goals: Week 1:  OT Short Term Goal 1 (Week 1): Pt will complete LB dressing with Min A with AE as necessary OT Short Term Goal 2 (Week 1): Pt will complete toilet ttransfer at [facility] with AE as necessary OT Short Term Goal 3 (Week 1): Pt will complete UB dressing with CGA  Skilled Therapeutic Interventions/Progress Updates:      Therapy Documentation Precautions:  Precautions Precautions: Fall, Cervical Required Braces or Orthoses: Other Brace Other Brace: soft collar for comfort Restrictions Weight Bearing Restrictions Per Provider Order: No General: "My leg feels tight today" Pt supine in bed upon OT arrival, agreeable to OT session.  Pain: no pain reported  ADL: OT providing skilled intervention fro functional mobility. Pt able to ambulate from room><therapy gym with SBA with RW and no LOB/SOB, VC for increased swing phase on LLE. OT providing assistance with functional mobility without RW for increased dynamic balance and BLE strength, pt able to ambulate ~200 feet overall  Min A overall with occasional resetting of balance required during mobility. Pt with slight lean to Lt when ambulating without UE support. VC provided for increased typical movement patterns.    Exercises: OT instructing pt in toileitng simulation activities such as managing ball around waist, managing ball from hand to hard with eyes closed for increased BUE sensitivity for light touch. Pt completing multiple rounds of activities with PRN rest breaks d/t increased difficulty of task. OT using 2.2# ball for increased deep pressure when carrying then golf ball for increased difficulty with task grading. Pt would benefit from increased practice d/t decreased BUE sensitivity and bilateral  numbness/tingling.   Pt seated in recliner at end of session with W/C alarm donned, call light within reach and 4Ps assessed.    Therapy/Group: Individual Therapy  Nila Barth, OTD, OTR/L 04/14/2024, 4:09 PM

## 2024-04-14 NOTE — Progress Notes (Signed)
 PROGRESS NOTE   Subjective/Complaints: Patient reports his left neck is little bit sore after doing a lot of writing, he is left-handed.  Patient feels that spasticity improved with medication increase over the weekend.  Reports he had a very good bowel movement, continent yesterday.   ROS:   Pt denies fever, SOB, abd pain, CP, N/V/C/D, and vision changes  Bladder urgency- continued + constipation -improved   Objective:   No results found.  No results for input(s): "WBC", "HGB", "HCT", "PLT" in the last 72 hours.  No results for input(s): "NA", "K", "CL", "CO2", "GLUCOSE", "BUN", "CREATININE", "CALCIUM " in the last 72 hours.   Intake/Output Summary (Last 24 hours) at 04/14/2024 1713 Last data filed at 04/14/2024 1500 Gross per 24 hour  Intake 480 ml  Output 650 ml  Net -170 ml        Physical Exam: Vital Signs Blood pressure 123/68, pulse (!) 58, temperature 98 F (36.7 C), temperature source Oral, resp. rate 17, height 5\' 9"  (1.753 m), weight 98 kg, SpO2 98%.     General: awake, alert, appropriate, sitting up in bedside chair; NAD HENT: conjugate gaze; oropharynx moist CV: regular rhythm, bradycardic rate; no JVD Pulmonary: CTA B/L; no W/R/R- good air movement GI: soft, NT, slightly distended- normoactive Psychiatric: appropriate- interactive Neurological: Ox3 LUE MAS of 2 today Extremities: 1+ edema LUE and no LE edema this AM Psych: Pt's affect is appropriate. Pt is cooperative Skin: Surgical incision CDI Neuro:     Mental Status: AAOx3,  no apparent cognitive deficits noted CRANIAL NERVES: 2-12 grossly intact     MOTOR: RUE: 4/5 Deltoid, 4/5 Biceps, 4/5 Triceps, 4-/5 Grip LUE: 3/5 Deltoid, 4-/5 Biceps, 4-/5 Triceps, 3/5 Grip RLE: HF 4-/5, KE 4/5, ADF 4-/5, APF 4-/5 LLE: HF 3/5, KE 4-/5, ADF 4-/5, APF 4-/5  SENSORY: Intact but altered light touch all 4 extremities   MSK: No joint  tenderness/swelling noted.    Assessment/Plan: 1. Functional deficits which require 3+ hours per day of interdisciplinary therapy in a comprehensive inpatient rehab setting. Physiatrist is providing close team supervision and 24 hour management of active medical problems listed below. Physiatrist and rehab team continue to assess barriers to discharge/monitor patient progress toward functional and medical goals  Care Tool:  Bathing              Bathing assist Assist Level: Supervision/Verbal cueing     Upper Body Dressing/Undressing Upper body dressing   What is the patient wearing?: Pull over shirt    Upper body assist Assist Level: Minimal Assistance - Patient > 75%    Lower Body Dressing/Undressing Lower body dressing      What is the patient wearing?: Pants     Lower body assist Assist for lower body dressing: Minimal Assistance - Patient > 75%     Toileting Toileting    Toileting assist Assist for toileting: Moderate Assistance - Patient 50 - 74%     Transfers Chair/bed transfer  Transfers assist     Chair/bed transfer assist level: Contact Guard/Touching assist     Locomotion Ambulation   Ambulation assist      Assist level: Minimal Assistance - Patient > 75%  Assistive device: Walker-rolling Max distance: 150'   Walk 10 feet activity   Assist     Assist level: Minimal Assistance - Patient > 75% Assistive device: Walker-rolling   Walk 50 feet activity   Assist    Assist level: Minimal Assistance - Patient > 75% Assistive device: Walker-rolling    Walk 150 feet activity   Assist Walk 150 feet activity did not occur: Safety/medical concerns  Assist level: Minimal Assistance - Patient > 75% Assistive device: Walker-rolling    Walk 10 feet on uneven surface  activity   Assist     Assist level: Minimal Assistance - Patient > 75% Assistive device: Walker-rolling   Wheelchair     Assist Is the patient using a  wheelchair?: Yes (Per PT documentation) Type of Wheelchair: Manual    Wheelchair assist level: Dependent - Patient 0%      Wheelchair 50 feet with 2 turns activity    Assist        Assist Level: Dependent - Patient 0%   Wheelchair 150 feet activity     Assist      Assist Level: Dependent - Patient 0%   Blood pressure 123/68, pulse (!) 58, temperature 98 F (36.7 C), temperature source Oral, resp. rate 17, height 5\' 9"  (1.753 m), weight 98 kg, SpO2 98%.   Medical Problem List and Plan: 1. Functional deficits secondary to cervical stenosis/myelopathy status post ACDF C3-5 04/03/2024             -patient may shower, cover incision please             -ELOS/Goals: 14-16 days, PT/OT supervision/Mod I             Con't CIR               -PFRAOs b/l 2.  Antithrombotics: -DVT/anticoagulation:  Pharmaceutical: Lovenox .  Check vascular             -antiplatelet therapy: Aspirin  81 mg daily 3. Pain Management: Neurontin  400 mg daily, Robaxin  500 mg every 6 hours as needed, oxycodone   as needed  4/17therapy reporting patient has been having some left knee pain from chronic arthritis, will start Voltaren   Order K-pad  4/18 neck pain and knee pain much improved today, continue current regimen  4/20- Reports neck pain is tolerable-  4. Mood/Behavior/Sleep: Provide emotional support             -antipsychotic agents: N/A 5. Neuropsych/cognition: This patient is capable of making decisions on his own behalf. 6. Skin/Wound Care: Routine skin checks 7. Fluids/Electrolytes/Nutrition: Routine in and outs with follow-up chemistries 8.  Constipation vs Neurogenic bowel              - Scheduled MiraLAX  twice daily and continue Senokot.  Consider suppository bowel program if not having regular Bms  -Pt doesn't want to try suppository, will do sorbitol  60ml for now  -4/17 patient had very large bowel movement yesterday, continent.   Continue MiraLAX  and Senokot and monitor. -4/18 LBM  was after sorbitol - continent but was was liquid after this medication, will check abdominal xray to assess stool burden.  Will order dulcolax capsule today, pt reports this has worked will for him in the past. 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 Continue suppository at night PRN for now pt resistant to trying. 4/19- had blow out last night- however I discussed with pt about neurogenic bowel as well as constipation from baclofen  playing a role- -will increase Senna to 3 tabs/day  since increasing baclofen  4/20- Feels like could have BM this AM- but if doesn't was hoping for another Dulcolax PO- explained increased his Senna, but if needs additional meds, let nursing know 4/21 Reports good BM yesterday, continue current regimen  9.  Hypertension.  Vasotec  10 mg twice daily.  Monitor with increased mobility             -4/17-19 controlled overall continue to monitor  4/21 overall controlled, continue current regimen       04/14/2024    3:10 PM 04/14/2024    4:40 AM 04/13/2024    7:53 PM  Vitals with BMI  Systolic 123 149 272  Diastolic 68 81 87  Pulse 58 57 58    10.  CAD status post PCI 2000 08/2012.  Follow-up with cardiology services Dr. Burney Carter. Denies CP 11.  Hyperlipidemia.  Lipitor  12.  Obesity.  BMI 34.18.  Dietary follow-up  4/19- BMI down to 31.91 13. Possible Neurogenic bladder             -PVR/bladder scans IC if greater than 350  - 4/16 Will ask nursing check bladder scan/PVR  -4/17 patient did have a bladder scan yesterday to 12, will asked nursing to try to check PVRs  -4/18 patient had 3 PVRs all under 100, and continent  4/19- will d/c bladder scans  4/21 remains continent  14) Spasticity - Will start baclofen  5 mg 3 times daily -4/16-8 improved continue current regimen and monitor 4/19- Spasticity sounds like it's progressing- will increase Baclofen  to 10 mg TID  4/20- pt reports is a little better, however really wants to increase as soon as possible- I  explained we can increase on Tuesday AM to 15 mg TID    LOS: 6 days A FACE TO FACE EVALUATION WAS PERFORMED  Lylia Sand 04/14/2024, 5:13 PM

## 2024-04-14 NOTE — Progress Notes (Signed)
 Physical Therapy Session Note  Patient Details  Name: Nathaniel Hobbs MRN: [id number] Date of Birth: 12-01-1961  Today's Date: 04/14/2024 PT Individual Time: 0900-0945 PT Individual Time Calculation (min): 45 min   Short Term Goals: Week 1:  PT Short Term Goal 1 (Week 1): Pt will transfer sup to sit w/ min A PT Short Term Goal 2 (Week 1): Pt will transfer sit to stand w/ CGA PT Short Term Goal 3 (Week 1): Pt will amb w/ RW x 100 and C/S PT Short Term Goal 4 (Week 1): Pt will negotiate stairs w/ 1 rail and min A.  Skilled Therapeutic Interventions/Progress Updates:    Pt received in recliner and agreeable to therapy.  Reports 4/10 pain, premedicated. Rest and positioning provided as needed.   Pt with questions about spasticity and long term management. Discussed that spasticity tends to progress for 12-18 months after SCI and is then managed in several ways. Discussed that his MD is addressing with medication and PT interventions like ROM and weight bearing are also effective in managing spasticity.   Pt ambulated to and from main gym (~220 ft) with RW and CGA overall.  Pt with "stiff" gait related to spasticity and low foot clearance L>R, which improved with cueing.   Pt participated in dynamic gait and balance training, performing figure 8's with no AD and min a. Pt doffed crocs for 2nd bout, improving confidence and stability. Intermittent LOB requiring no more than min a. Performed x 4 bouts with seated rest breaks for energy management.   Pt returned to room and to recliner, was left with all needs in reach and alarm active.    Therapy Documentation Precautions:  Precautions Precautions: Fall, Cervical Required Braces or Orthoses: Other Brace Other Brace: soft collar for comfort Restrictions Weight Bearing Restrictions Per Provider Order: No General:       Therapy/Group: Individual Therapy  Tex Filbert 04/14/2024, 12:57 PM

## 2024-04-14 NOTE — Progress Notes (Signed)
 Occupational Therapy Session Note  Patient Details  Name: Nathaniel Hobbs MRN: [id number] Date of Birth: 05/15/1961  Today's Date: 04/14/2024 OT Individual Time: 5956-3875+ 1005-1051 OT Individual Time Calculation (min): 45 min    Short Term Goals: Week 1:  OT Short Term Goal 1 (Week 1): Pt will complete LB dressing with Min A with AE as necessary OT Short Term Goal 2 (Week 1): Pt will complete toilet ttransfer at [facility] with AE as necessary OT Short Term Goal 3 (Week 1): Pt will complete UB dressing with CGA  Skilled Therapeutic Interventions/Progress Updates:  Session 1: Pt greeted seated in recliner, pt agreeable to OT intervention.      Transfers/bed mobility/functional mobility:  Pt completed functional ambulation in room with RW and CGA. MIN A needed to rise from recliner after sitting in recliner for 2+ hours.   Therapeutic activity:  Pt completed various therapeutic activities focused on [facility] with pt instructed to flip over cards on tray table with LUE, noted impaired proprioception with pt needing to look at his L hand when completing manipulation task. Pt completed task with + time but overall supervision. Pt using more of gross grasp vs lateral pinch grasp when supinating wrist. Worked on hand writing skills with pt able to hold pen in L hand to complete letter cancellation worksheet with + time but MODI. Graded task up with pt able to write his name in print and in cursive with + time but MODI. Left pt with homework of working on writing grocery list for his wife with categories provided.    ADLs:  Grooming: pt stood at sink for oral care and to don body spray with supervision UB dressing:pt donned new shirt with light MIN A to pull shirt down in the back  LB dressing: pt donned pants with MIN A to thread and pull to waist line Footwear: donned socks with total A for time mgmt   Bathing: pt completed bathing seated on TTB in walkin shower with supervision.  Transfers: ambulatory  ADL transfers with RW and CGA  Ended session with pt seated in recliner with all needs within reach and chair  alarm activated.                    Session 2: Pt greeted seated in recliner, pt agreeable to OT intervention.      Transfers/bed mobility/functional mobility:  Pt completed short distance functional mobility in room with no AD and CGA.   Therapeutic activity:  Pt completed bimanual task with pt instructed to hang horseshoes over vertical board with LUE with pt completing task with CGA for balance.  Pt completed bimnaual task with pt instrucetd to use RUE to manipulate clothespins and LUE to retreive cards and clip cards to vertical board.  Pt then completed same task however this time LUE manipulating weighted clothespins and RUE removing cards from board. Pt completed task in standing with CGA for balance but MODI for manipulation tasks with + time.   Exercises:  Gentle stretching for L shoulder, active ROM via table slides x10 reps from seated postion at hi lo table.                  Ended session with pt seated in recliner with all needs within reach and chair alarm activated.                    Therapy Documentation Precautions:  Precautions Precautions: Fall, Cervical Required Braces or Orthoses: Other  Brace Other Brace: soft collar for comfort Restrictions Weight Bearing Restrictions Per Provider Order: No  Pain: Session 1: no pain reported during session  Session 2: unrated pain reported in L hand, stretches and rest breaks provided as needed.     Therapy/Group: Individual Therapy  Mollie Anger [facility] 04/14/2024, 12:07 PM

## 2024-04-15 DIAGNOSIS — G959 Disease of spinal cord, unspecified: Secondary | ICD-10-CM | POA: Diagnosis not present

## 2024-04-15 MED ORDER — BACLOFEN 10 MG PO TABS
15.0000 mg | ORAL_TABLET | Freq: Three times a day (TID) | ORAL | Status: DC
  Administered 2024-04-15 – 2024-04-21 (×21): 15 mg via ORAL
  Filled 2024-04-15 (×22): qty 2

## 2024-04-15 NOTE — Progress Notes (Signed)
 PROGRESS NOTE   Subjective/Complaints:  Pt reports neck was on crease of bed last night- woke up sore and throbbing, bothering him, hasn't gotten AM meds yet.  LBM last night- barely made it to bathroom- had urgency and looser stool.  Not quite a blow out.   We discussed that still wants to increase Baclofen  for spasticity- will likley balance out some the looser stools.   ROS:    Pt denies SOB, abd pain, CP, N/V/C/D, and vision changes  Bladder urgency- continued + constipation -improved   Objective:   No results found.  No results for input(s): "WBC", "HGB", "HCT", "PLT" in the last 72 hours.  No results for input(s): "NA", "K", "CL", "CO2", "GLUCOSE", "BUN", "CREATININE", "CALCIUM " in the last 72 hours.   Intake/Output Summary (Last 24 hours) at 04/15/2024 0850 Last data filed at 04/15/2024 0800 Gross per 24 hour  Intake 480 ml  Output 600 ml  Net -120 ml        Physical Exam: Vital Signs Blood pressure 123/66, pulse (!) 56, temperature 97.8 F (36.6 C), temperature source Oral, resp. rate 17, height 5\' 9"  (1.753 m), weight 98 kg, SpO2 98%.      General: awake, alert, appropriate, sitting up in bedside chair; NAD HENT: conjugate gaze; oropharynx moist CV: regular rhythm and bradycardic  rate; no JVD Pulmonary: CTA B/L; no W/R/R- good air movement GI: soft, NT, ND, (+)BS Psychiatric: appropriate- brighter affect Neurological: Ox3  LUE MAS of 2 today- about the same today Extremities: 1+ edema LUE and no LE edema this AM Psych: Pt's affect is appropriate. Pt is cooperative Skin: Surgical incision CDI Neuro:     Mental Status: AAOx3,  no apparent cognitive deficits noted CRANIAL NERVES: 2-12 grossly intact     MOTOR: RUE: 4/5 Deltoid, 4/5 Biceps, 4/5 Triceps, 4-/5 Grip LUE: 3/5 Deltoid, 4-/5 Biceps, 4-/5 Triceps, 3/5 Grip RLE: HF 4-/5, KE 4/5, ADF 4-/5, APF 4-/5 LLE: HF 3/5, KE 4-/5, ADF 4-/5,  APF 4-/5  SENSORY: Intact but altered light touch all 4 extremities   MSK: No joint tenderness/swelling noted.    Assessment/Plan: 1. Functional deficits which require 3+ hours per day of interdisciplinary therapy in a comprehensive inpatient rehab setting. Physiatrist is providing close team supervision and 24 hour management of active medical problems listed below. Physiatrist and rehab team continue to assess barriers to discharge/monitor patient progress toward functional and medical goals  Care Tool:  Bathing              Bathing assist Assist Level: Supervision/Verbal cueing     Upper Body Dressing/Undressing Upper body dressing   What is the patient wearing?: Pull over shirt    Upper body assist Assist Level: Minimal Assistance - Patient > 75%    Lower Body Dressing/Undressing Lower body dressing      What is the patient wearing?: Pants     Lower body assist Assist for lower body dressing: Minimal Assistance - Patient > 75%     Toileting Toileting    Toileting assist Assist for toileting: Moderate Assistance - Patient 50 - 74%     Transfers Chair/bed transfer  Transfers assist  Chair/bed transfer assist level: Contact Guard/Touching assist     Locomotion Ambulation   Ambulation assist      Assist level: Minimal Assistance - Patient > 75% Assistive device: Walker-rolling Max distance: 150'   Walk 10 feet activity   Assist     Assist level: Minimal Assistance - Patient > 75% Assistive device: Walker-rolling   Walk 50 feet activity   Assist    Assist level: Minimal Assistance - Patient > 75% Assistive device: Walker-rolling    Walk 150 feet activity   Assist Walk 150 feet activity did not occur: Safety/medical concerns  Assist level: Minimal Assistance - Patient > 75% Assistive device: Walker-rolling    Walk 10 feet on uneven surface  activity   Assist     Assist level: Minimal Assistance - Patient >  75% Assistive device: Development worker, international aid     Assist Is the patient using a wheelchair?: Yes (Per PT documentation) Type of Wheelchair: Manual    Wheelchair assist level: Dependent - Patient 0%      Wheelchair 50 feet with 2 turns activity    Assist        Assist Level: Dependent - Patient 0%   Wheelchair 150 feet activity     Assist      Assist Level: Dependent - Patient 0%   Blood pressure 123/66, pulse (!) 56, temperature 97.8 F (36.6 C), temperature source Oral, resp. rate 17, height 5\' 9"  (1.753 m), weight 98 kg, SpO2 98%.   Medical Problem List and Plan: 1. Functional deficits secondary to cervical stenosis/myelopathy status post ACDF C3-5 04/03/2024             -patient may shower, cover incision please             -ELOS/Goals: 14-16 days, PT/OT supervision/Mod I            Con't CIR PT and OT  Team conference today to determine length of stay             -PFRAOs b/l 2.  Antithrombotics: -DVT/anticoagulation:  Pharmaceutical: Lovenox .  Check vascular             -antiplatelet therapy: Aspirin  81 mg daily 3. Pain Management: Neurontin  400 mg daily, Robaxin  500 mg every 6 hours as needed, oxycodone   as needed  4/17therapy reporting patient has been having some left knee pain from chronic arthritis, will start Voltaren   Order K-pad  4/18 neck pain and knee pain much improved today, continue current regimen  4/20- Reports neck pain is tolerable-   4/22- Neck pain/overall pain doing better, except for spasticity pain 4. Mood/Behavior/Sleep: Provide emotional support             -antipsychotic agents: N/A 5. Neuropsych/cognition: This patient is capable of making decisions on his own behalf. 6. Skin/Wound Care: Routine skin checks 7. Fluids/Electrolytes/Nutrition: Routine in and outs with follow-up chemistries 8.  Constipation vs Neurogenic bowel              - Scheduled MiraLAX  twice daily and continue Senokot.  Consider suppository bowel  program if not having regular Bms  -Pt doesn't want to try suppository, will do sorbitol  60ml for now  -4/17 patient had very large bowel movement yesterday, continent.   Continue MiraLAX  and Senokot and monitor. -4/18 LBM was after sorbitol - continent but was was liquid after this medication, will check abdominal xray to assess stool burden.  Will order dulcolax capsule today, pt reports this has worked will for  him in the past. 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 Continue suppository at night PRN for now pt resistant to trying. 4/19- had blow out last night- however I discussed with pt about neurogenic bowel as well as constipation from baclofen  playing a role- -will increase Senna to 3 tabs/day since increasing baclofen  4/20- Feels like could have BM this AM- but if doesn't was hoping for another Dulcolax PO- explained increased his Senna, but if needs additional meds, let nursing know 4/21 Reports good BM yesterday, continue current regimen  4/22- had a "blow out" that barely got to bathroom for last night- but we discussed if we go up on Baclofen , could balance these issues out. Wait to reduce Senna 9.  Hypertension.  Vasotec  10 mg twice daily.  Monitor with increased mobility             -4/17-19 controlled overall continue to monitor  4/21-4/22 overall controlled, continue current regimen       04/15/2024    5:05 AM 04/14/2024    8:02 PM 04/14/2024    3:10 PM  Vitals with BMI  Systolic 123 113 562  Diastolic 66 94 68  Pulse 56 58 58    10.  CAD status post PCI 2000 08/2012.  Follow-up with cardiology services Dr. Burney Carter. Denies CP 11.  Hyperlipidemia.  Lipitor  12.  Obesity.  BMI 34.18.  Dietary follow-up  4/19- BMI down to 31.91 13. Possible Neurogenic bladder             -PVR/bladder scans IC if greater than 350  - 4/16 Will ask nursing check bladder scan/PVR  -4/17 patient did have a bladder scan yesterday to 12, will asked nursing to try to check PVRs  -4/18  patient had 3 PVRs all under 100, and continent  4/19- will d/c bladder scans  4/21 remains continent  14) Spasticity - Will start baclofen  5 mg 3 times daily -4/16-8 improved continue current regimen and monitor 4/19- Spasticity sounds like it's progressing- will increase Baclofen  to 10 mg TID  4/20- pt reports is a little better, however really wants to increase as soon as possible- I explained we can increase on Tuesday AM to 15 mg TID  4/22- Increasing Baclofen  to 15 mg TID- for continued spasticity- educated could make more sleepy/more constipated, but just increased Senna, so should help with that.   I spent a total of  38  minutes on total care today- >50% coordination of care- due to  Education/d/w pt about spasticity and side effects of meds- also about bowels- also team conference today to determine length of stay  LOS: 7 days A FACE TO FACE EVALUATION WAS PERFORMED  Franchelle Foskett 04/15/2024, 8:50 AM

## 2024-04-15 NOTE — Progress Notes (Signed)
 Physical Therapy Session Note  Patient Details  Name: Nathaniel Hobbs MRN: [id number] Date of Birth: 01-09-1961  Today's Date: 04/15/2024 PT Individual Time: 1330-1400 PT Individual Time Calculation (min): 30 min   Short Term Goals: Week 1:  PT Short Term Goal 1 (Week 1): Pt will transfer sup to sit w/ min A PT Short Term Goal 2 (Week 1): Pt will transfer sit to stand w/ CGA PT Short Term Goal 3 (Week 1): Pt will amb w/ RW x 100 and C/S PT Short Term Goal 4 (Week 1): Pt will negotiate stairs w/ 1 rail and min A.  Skilled Therapeutic Interventions/Progress Updates:    Pt finishing lunch, assisted with repositioning for improved alignment and pt engaged in discussion regarding overall goals and d/c date set in conference today. Initially pt reporting he wants to leave here without device. Educated pt on purpose of device, progression of balance, fall risk and h/o falls as well as role of OPPT and progressing AD. Pt states that this makes sense and was grateful for the explanation. Pt states he has noticed decreased "stiffness" in LLE after medication was changed but states he has been really tired today and fell asleep while earlier PT finished with him (notified MD). Focused on functional mobility retraining with and without device with focus on balance retraining, coordination, and strengthening. Gait with RW to and from therapy gym x 150' each direction with focus on LLE knee control to decreased hyperextension (initially hyperextended more but when able to focus and provide cues, decreased until fatigued and increased again), balance, decreased reliance on UE support (just to use for balance), and heel strike. Without device performed gait x 10' forwards x backwards x 3 trials with min assist for balance and cues for technique, attention to LLE control, and focus on eccentric control during transitions for sit <> stands between trials. Returned to recliner at end of session with all needs in reach.    Therapy Documentation Precautions:  Precautions Precautions: Fall, Cervical Required Braces or Orthoses: Other Brace Other Brace: soft collar for comfort Restrictions Weight Bearing Restrictions Per Provider Order: No  Pain:  Denies pain.    Therapy/Group: Individual Therapy  [name], PT, DPT, CBIS  04/15/2024, 3:09 PM

## 2024-04-15 NOTE — Progress Notes (Addendum)
 Patient ID: Nathaniel Hobbs, male   DOB: 29-May-1961, 63 y.o.   MRN: [id number]  Met with pt to give team conference update regarding goals of mod/I level and target discharge of 4/30. He wants to get to the level he can use only a cane instead of a rolling walker that he is using now. PT feels he will need to use a rolling walker at discharge. Discussed target discharge date of 4/30. He feels he will be ready by then. Discussed OP therapies recommended he did go to a place in [place] when he had his hips done. He will try to find out the name of the place and give to worker. Will continue to work on discharge needs. Pt is very pleased with his progress while here.  3;10 PM Emailed wife the handicapped application for the placard for their car

## 2024-04-15 NOTE — Progress Notes (Incomplete)
 Physical Therapy Session Note  Patient Details  Name: Nathaniel Hobbs MRN: [id number] Date of Birth: [birth year]  Today's Date: 04/15/2024 PT Individual Time: 4098-1191 PT Individual Time Calculation (min): 35 min   Short Term Goals: Week 1:  PT Short Term Goal 1 (Week 1): Pt will transfer sup to sit w/ min A PT Short Term Goal 2 (Week 1): Pt will transfer sit to stand w/ CGA PT Short Term Goal 3 (Week 1): Pt will amb w/ RW x 100 and C/S PT Short Term Goal 4 (Week 1): Pt will negotiate stairs w/ 1 rail and min A.   Skilled Therapeutic Interventions/Progress Updates:  Patient seated upright in recliner on entrance to room. Patient alert and agreeable to PT session. Does relate need to toilet.   Patient with no pain complaint at start of session.  Therapeutic Activity: Transfers: Pt performed sit<>stand and stand pivot transfers throughout session with MinA initially and improving to CGA/supervision by end of session. Provided vc/ tc for improved technique including foot placement and adequate forward lean for anterior weight shift.     Gait Training:  Pt ambulated *** ft using *** with ***. Demonstrated ***. Provided vc/ tc for ***.  Neuromuscular Re-ed: NMR facilitated during session with focus on standing balance, motor control, proprioception. Pt guided in sit<>stand transfers with focus on decreasing BUE use. NMR performed for improvements in motor control and coordination, balance, sequencing, judgement, and self confidence/ efficacy in performing all aspects of mobility at highest level of independence.   Patient seated upright in recliner at end of session with brakes locked, belt alarm set, and all needs within reach.   Therapy Documentation Precautions:  Precautions Precautions: Fall, Cervical Required Braces or Orthoses: Other Brace Other Brace: soft collar for comfort Restrictions Weight Bearing Restrictions Per Provider Order: No General:   Vital Signs:    Pain: Pain Assessment Pain Scale: 0-10 Pain Score: 4  Pain Location: Neck Mobility:   Locomotion :    Trunk/Postural Assessment :    Balance:   Exercises:   Other Treatments:      Therapy/Group: Individual Therapy  [name] 04/15/2024, 10:29 AM

## 2024-04-15 NOTE — Progress Notes (Signed)
 Physical Therapy Session Note  Patient Details  Name: Nathaniel Hobbs MRN: [id number] Date of Birth: 1961/09/07  Today's Date: 04/15/2024 PT Individual Time: 1130-1200 PT Individual Time Calculation (min): 30 min   Short Term Goals: Week 1:  PT Short Term Goal 1 (Week 1): Pt will transfer sup to sit w/ min A PT Short Term Goal 2 (Week 1): Pt will transfer sit to stand w/ CGA PT Short Term Goal 3 (Week 1): Pt will amb w/ RW x 100 and C/S PT Short Term Goal 4 (Week 1): Pt will negotiate stairs w/ 1 rail and min A.  Skilled Therapeutic Interventions/Progress Updates: Pt presents sitting in recliner and asleep but arouses quickly.  PT retrieved Swedish knee cage  from gym and donned.  Pt amb w/ RW and min/CGA and cues for L foot clearance.  Pt amb w/ reciprocal gait but as fatigues, decreased L foot BOS and foot clearance, requiring verbal cues.  Pt amb x 150' w/ noted circumduction and steppage LLE as fatigues.  Pt returns to recliner and remained sitting w/ seat alarm on and all needs in reach, legs elevated.     Therapy Documentation Precautions:  Precautions Precautions: Fall, Cervical Required Braces or Orthoses: Other Brace Other Brace: soft collar for comfort Restrictions Weight Bearing Restrictions Per Provider Order: No General:   Vital Signs:   Pain:0/10      Therapy/Group: Individual Therapy  Laiklynn Raczynski P Milo Solana 04/15/2024, 12:06 PM

## 2024-04-15 NOTE — Progress Notes (Signed)
 Occupational Therapy Session Note  Patient Details  Name: Nathaniel Hobbs MRN: [id number] Date of Birth: 01-27-1961  Today's Date: 04/15/2024 OT Individual Time: 0945-1100 & 1420-1530 OT Individual Time Calculation (min): 75 min & 70 min   Short Term Goals: Week 1:  OT Short Term Goal 1 (Week 1): Pt will complete LB dressing with Min A with AE as necessary OT Short Term Goal 2 (Week 1): Pt will complete toilet ttransfer at [facility] with AE as necessary OT Short Term Goal 3 (Week 1): Pt will complete UB dressing with CGA  Skilled Therapeutic Interventions/Progress Updates:      Therapy Documentation Precautions:  Precautions Precautions: Fall, Cervical Required Braces or Orthoses: Other Brace Other Brace: soft collar for comfort Restrictions Weight Bearing Restrictions Per Provider Order: No Session 1 General: "I did not sleep well last night" Pt seated in recliner upon OT arrival, agreeable to OT.  Pain:  2/10 pain reported in neck, activity, intermittent rest breaks, distractions provided for pain management, pt reports tolerable to proceed.   Exercises: Pt reporting wanting to work on functional mobility and endurance. OT providing skilled intervention with functional mobility. Pt able to ambulate with RW around outdoor area on uneven terrain, around obstacles and complete multiple sit to stand transfers after PRN breaks . Pt with no LOB/SOB, able to multi-task with conversing and ambulating simultaneously with distractions of others provided in order to increase awareness for D/C preparation. Pt able to navigate slight incline/decline in pavement as well while ambulating with increased time. All mobility at CGA-SBA level with RW   Other Treatments: OT instructing pt in toileting simulation activities such as managing yellow sponge around waist, managing ball from hand to hard with eyes closed for increased BUE sensitivity for light touch and proprioception for increased independence  with toileting. Pt completing multiple rounds of activities. Pt with noted decreased difficulty with task this session compared to yesterday's session with less errors noted.   Pt seated in recliner at end of session with W/C alarm donned, call light within reach and 4Ps assessed.    Session 2 General: "I want to go outside again!" Pt seated in W/C upon OT arrival, agreeable to OT.  Pain: no pain reported  ADL: OT providing skilled intervention on ADL retraining in order to increase independence with tasks and increase activity tolerance. Pt completed the following tasks at the current level of assist: UB dressing: SBA in standing for donning/doffing overhead shirt with no LOB LB dressing: CGA in standing without AD to manage over waist, able to thread over legs in sitting Footwear: total A, OT educated pt on use of sock aide for increased independence Shower transfer: Min A without use of AD ambulating from recliner><TTB Bathing: SBA seated on TTB, standing with use of grab bar to wash peri area   Exercises: OT providing intervention on sit to stand training with focus on eccentric decent to sitting d/t decreased control for increased quad activation. Pt completed 3x5 sets of modified squats with increased control on decent noted throughout trials.  Other Treatments: OT educated/discussed use of RW and TTB at home for increased independence rather than requiring more assistance without use of AD. Pt understanding of conversation and OT recommendations for use of AD for increased safety and independence.    Pt seated in recliner at end of session with W/C alarm donned, call light within reach and 4Ps assessed.    Therapy/Group: Individual Therapy  Nila Barth, OTD, OTR/L 04/15/2024, 3:41 PM

## 2024-04-15 NOTE — Progress Notes (Signed)
 Physical Therapy Session Note  Patient Details  Name: Clearence Vitug MRN: [id number] Date of Birth: 1961-02-24  Today's Date: 04/15/2024 PT Individual Time: 7829-5621 PT Individual Time Calculation (min): 35 min   Short Term Goals: Week 1:  PT Short Term Goal 1 (Week 1): Pt will transfer sup to sit w/ min A PT Short Term Goal 2 (Week 1): Pt will transfer sit to stand w/ CGA PT Short Term Goal 3 (Week 1): Pt will amb w/ RW x 100 and C/S PT Short Term Goal 4 (Week 1): Pt will negotiate stairs w/ 1 rail and min A. Week 2:     Skilled Therapeutic Interventions/Progress Updates:      Therapy Documentation Precautions:  Precautions Precautions: Fall, Cervical Required Braces or Orthoses: Other Brace Other Brace: soft collar for comfort Restrictions Weight Bearing Restrictions Per Provider Order: No General:   Vital Signs:   Pain: Pain Assessment Pain Scale: 0-10 Pain Score: 4  Pain Location: Neck Mobility:   Locomotion :    Trunk/Postural Assessment :    Balance:   Exercises:   Other Treatments:      Therapy/Group: Individual Therapy  [name] 04/15/2024, 10:29 AM

## 2024-04-16 ENCOUNTER — Encounter: Admitting: Physician Assistant

## 2024-04-16 DIAGNOSIS — G959 Disease of spinal cord, unspecified: Secondary | ICD-10-CM | POA: Diagnosis not present

## 2024-04-16 MED ORDER — OXYCODONE HCL 5 MG PO TABS: 5.0000 mg | ORAL_TABLET | ORAL | Status: DC | PRN

## 2024-04-16 NOTE — Progress Notes (Signed)
 Occupational Therapy Session Note  Patient Details  Name: Nathaniel Hobbs MRN: [id number] Date of Birth: July 31, 1961  Today's Date: 04/16/2024 OT Individual Time: 1005-1105 7 1445-1530 OT Individual Time Calculation (min): 60 min & 45 min    Short Term Goals: Week 1:  OT Short Term Goal 1 (Week 1): Pt will complete LB dressing with Min A with AE as necessary OT Short Term Goal 2 (Week 1): Pt will complete toilet ttransfer at [facility] with AE as necessary OT Short Term Goal 3 (Week 1): Pt will complete UB dressing with CGA  Skilled Therapeutic Interventions/Progress Updates:      Therapy Documentation Precautions:  Precautions Precautions: Fall, Cervical Required Braces or Orthoses: Other Brace Other Brace: soft collar for comfort Restrictions Weight Bearing Restrictions Per Provider Order: No Session 1 General: "good morning!" Pt seated in recliner upon OT arrival, agreeable to OT.  Pain: no pain reported  ADL: OT providing skilled intervention with functional mobility, pt able to ambulate from room><therapy gym at SBA level with RW  with no LOB/SOB. Pt still exhibiting Lt knee/leg stiffness.   Other Treatments: OT instructing pt on 9HPT in order to complete standardized test for baseline BUE function. Pt scored R-1:24  L-1:26  which is not WNL of age and sex per pt demographics, although scores went up previously. Pt will be re-tested near D/C to determine increase of function. Pt first round was times, although continues to practice in hand manipulation for increased BUE [facility] for ADL tasks such as toileting.   Pt seated in recliner at end of session with W/C alarm donned, call light within reach and 4Ps assessed.    Session 2 General: "Wow you worked me!" Pt seated in W/C upon OT arrival, agreeable to OT.  Pain: no pain reported  ADL:  OT providing skilled intervention with functional mobility, pt able to ambulate from room><ortho gym at SBA level with RW  with no LOB/SOB. Pt  still exhibiting Lt knee/leg stiffness.   Exercises: Pt completed 10 minutes of nu step bike on hills mode, in order to increase BUE/BLEstrength and endurance in preparation for increased independence in ADLs such as stair navigation. No rest breaks necessary, on varying resistance from levels 4-10 resistance. Overall, pt completed 0.3 miles, 521 steps.   Other Treatments: OT providing skilled intervention on ADL retraining in order to increase independence with tasks and increase activity tolerance. Pt completed tub/shower transfer with TTB in order to increase independence with task for D/C preparation.  Pt seated in recliner at end of session with W/C alarm donned, call light within reach and 4Ps assessed.    Therapy/Group: Individual Therapy  Nila Barth, OTD, OTR/L 04/16/2024, 4:19 PM

## 2024-04-16 NOTE — Progress Notes (Signed)
 PROGRESS NOTE   Subjective/Complaints:  Pt reports "won the battle with the bed" last night- neck not as painful.  Sleepiness from baclofen  increase got better yesterday as day went on. Feels like 15 mg Baclofen  is better for spasticity but doesn't want to increase again right now.    LBM 2 days ago- denies constipation- doesn't want intervention yet- I agree.   Had 5 therapy sessions yesterday  Bladder urgency still an issue- is pretty new overall- but as soon as pushes call bell, needs to go urgently.    ROS:     Pt denies SOB, abd pain, CP, N/V/C/D, and vision changes  Bladder urgency- continued + constipation -improved   Objective:   No results found.  No results for input(s): "WBC", "HGB", "HCT", "PLT" in the last 72 hours.  No results for input(s): "NA", "K", "CL", "CO2", "GLUCOSE", "BUN", "CREATININE", "CALCIUM " in the last 72 hours.   Intake/Output Summary (Last 24 hours) at 04/16/2024 0811 Last data filed at 04/15/2024 1400 Gross per 24 hour  Intake 240 ml  Output --  Net 240 ml        Physical Exam: Vital Signs Blood pressure (!) 144/76, pulse (!) 49, temperature 97.6 F (36.4 C), temperature source Oral, resp. rate 18, height 5\' 9"  (1.753 m), weight 98 kg, SpO2 96%.       General: awake, alert, appropriate, sitting up in bedside chair; NAD HENT: conjugate gaze; oropharynx moist CV: regular rhythm, bradycardic rate; no JVD Pulmonary: CTA B/L; no W/R/R- good air movement GI: soft, NT, ND, (+)BS- normoactive Psychiatric: appropriate; interactive Neurological: Ox3  LUE MAS of 2 today- about the same today- no change- RUE 1  MAS- LE's 2- before AM meds Extremities: 1+ edema LUE and no LE edema this AM Psych: Pt's affect is appropriate. Pt is cooperative Skin: Surgical incision CDI Neuro:     Mental Status: AAOx3,  no apparent cognitive deficits noted CRANIAL NERVES: 2-12 grossly intact      MOTOR: RUE: 4/5 Deltoid, 4/5 Biceps, 4/5 Triceps, 4-/5 Grip LUE: 3/5 Deltoid, 4-/5 Biceps, 4-/5 Triceps, 3/5 Grip RLE: HF 4-/5, KE 4/5, ADF 4-/5, APF 4-/5 LLE: HF 3/5, KE 4-/5, ADF 4-/5, APF 4-/5  SENSORY: Intact but altered light touch all 4 extremities   MSK: No joint tenderness/swelling noted.    Assessment/Plan: 1. Functional deficits which require 3+ hours per day of interdisciplinary therapy in a comprehensive inpatient rehab setting. Physiatrist is providing close team supervision and 24 hour management of active medical problems listed below. Physiatrist and rehab team continue to assess barriers to discharge/monitor patient progress toward functional and medical goals  Care Tool:  Bathing              Bathing assist Assist Level: Supervision/Verbal cueing     Upper Body Dressing/Undressing Upper body dressing   What is the patient wearing?: Pull over shirt    Upper body assist Assist Level: Minimal Assistance - Patient > 75%    Lower Body Dressing/Undressing Lower body dressing      What is the patient wearing?: Pants     Lower body assist Assist for lower body dressing: Minimal Assistance - Patient >  75%     Toileting Toileting    Toileting assist Assist for toileting: Moderate Assistance - Patient 50 - 74%     Transfers Chair/bed transfer  Transfers assist     Chair/bed transfer assist level: Contact Guard/Touching assist     Locomotion Ambulation   Ambulation assist      Assist level: Contact Guard/Touching assist Assistive device: Walker-rolling Max distance: 150'   Walk 10 feet activity   Assist     Assist level: Contact Guard/Touching assist Assistive device: Walker-rolling   Walk 50 feet activity   Assist    Assist level: Contact Guard/Touching assist Assistive device: Walker-rolling    Walk 150 feet activity   Assist Walk 150 feet activity did not occur: Safety/medical concerns  Assist level: Contact  Guard/Touching assist Assistive device: Walker-rolling    Walk 10 feet on uneven surface  activity   Assist     Assist level: Minimal Assistance - Patient > 75% Assistive device: Walker-rolling   Wheelchair     Assist Is the patient using a wheelchair?: Yes (Per PT documentation) Type of Wheelchair: Manual    Wheelchair assist level: Dependent - Patient 0%      Wheelchair 50 feet with 2 turns activity    Assist        Assist Level: Dependent - Patient 0%   Wheelchair 150 feet activity     Assist      Assist Level: Dependent - Patient 0%   Blood pressure (!) 144/76, pulse (!) 49, temperature 97.6 F (36.4 C), temperature source Oral, resp. rate 18, height 5\' 9"  (1.753 m), weight 98 kg, SpO2 96%.   Medical Problem List and Plan: 1. Functional deficits secondary to cervical stenosis/myelopathy status post ACDF C3-5 04/03/2024             -patient may shower, cover incision please             -ELOS/Goals: 14-16 days, PT/OT supervision/Mod I            D/c4/30  Con't CIR PT and OT             -PFRAOs b/l 2.  Antithrombotics: -DVT/anticoagulation:  Pharmaceutical: Lovenox .  Check vascular             -antiplatelet therapy: Aspirin  81 mg daily 3. Pain Management: Neurontin  400 mg daily, Robaxin  500 mg every 6 hours as needed, oxycodone   as needed  4/17therapy reporting patient has been having some left knee pain from chronic arthritis, will start Voltaren   Order K-pad  4/18 neck pain and knee pain much improved today, continue current regimen  4/20- Reports neck pain is tolerable-   4/22- Neck pain/overall pain doing better, except for spasticity pain  4/23- taking pain meds q4 hours around the cock per nursing- will d/w pt when it's possible to try and reduce dosing 4. Mood/Behavior/Sleep: Provide emotional support             -antipsychotic agents: N/A 5. Neuropsych/cognition: This patient is capable of making decisions on his own behalf. 6.  Skin/Wound Care: Routine skin checks 7. Fluids/Electrolytes/Nutrition: Routine in and outs with follow-up chemistries 8.  Constipation vs Neurogenic bowel              - Scheduled MiraLAX  twice daily and continue Senokot.  Consider suppository bowel program if not having regular Bms  -Pt doesn't want to try suppository, will do sorbitol  60ml for now  -4/17 patient had very large bowel movement yesterday, continent.  Continue MiraLAX  and Senokot and monitor. -4/18 LBM was after sorbitol - continent but was was liquid after this medication, will check abdominal xray to assess stool burden.  Will order dulcolax capsule today, pt reports this has worked will for him in the past. 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 Continue suppository at night PRN for now pt resistant to trying. 4/19- had blow out last night- however I discussed with pt about neurogenic bowel as well as constipation from baclofen  playing a role- -will increase Senna to 3 tabs/day since increasing baclofen  4/20- Feels like could have BM this AM- but if doesn't was hoping for another Dulcolax PO- explained increased his Senna, but if needs additional meds, let nursing know 4/21 Reports good BM yesterday, continue current regimen  4/22- had a "blow out" that barely got to bathroom for last night- but we discussed if we go up on Baclofen , could balance these issues out. Wait to reduce Senna 4/23- LBM 2 days ago- denies constipation- wants ot wait for intervention 9.  Hypertension.  Vasotec  10 mg twice daily.  Monitor with increased mobility             -4/17-19 controlled overall continue to monitor  4/21-4/22 overall controlled, continue current regimen   4/23- BP overall controlled- a little elevated this AM- also bradycardic this AM- will monitor Sx's and vitals    04/16/2024    4:17 AM 04/15/2024    8:22 PM 04/15/2024    7:36 PM  Vitals with BMI  Systolic 144 114 604  Diastolic 76 68 68  Pulse 49  61    10.  CAD status  post PCI 2000 08/2012.  Follow-up with cardiology services Dr. Burney Carter. Denies CP 11.  Hyperlipidemia.  Lipitor  12.  Obesity.  BMI 34.18.  Dietary follow-up  4/19- BMI down to 31.91 13. Possible Neurogenic bladder             -PVR/bladder scans IC if greater than 350  - 4/16 Will ask nursing check bladder scan/PVR  -4/17 patient did have a bladder scan yesterday to 12, will asked nursing to try to check PVRs  -4/18 patient had 3 PVRs all under 100, and continent  4/19- will d/c bladder scans  4/21 remains continent   4/23- Having bladder urgency- will check CBC with diff in AM and if WBC elevated, will check U/A- however is common with SCI patients-  14) Spasticity - Will start baclofen  5 mg 3 times daily -4/16-8 improved continue current regimen and monitor 4/19- Spasticity sounds like it's progressing- will increase Baclofen  to 10 mg TID  4/20- pt reports is a little better, however really wants to increase as soon as possible- I explained we can increase on Tuesday AM to 15 mg TID  4/22- Increasing Baclofen  to 15 mg TID- for continued spasticity- educated could make more sleepy/more constipated, but just increased Senna, so should help with that.   4/23- pt was tired yesterday AM, but it got better- con't regimen   I spent a total of 36   minutes on total care today- >50% coordination of care- due to  D/w pt at length about bowels as well as spasticity and bladder-   LOS: 8 days A FACE TO FACE EVALUATION WAS PERFORMED  Raynell Scott 04/16/2024, 8:11 AM

## 2024-04-16 NOTE — Progress Notes (Signed)
 Physical Therapy Note  Patient Details  Name: Cabe Lashley MRN: [id number] Date of Birth: 07-26-1961 Today's Date: 04/16/2024   Today's Date: 04/16/2024 PT Missed Time: 45 Minutes Missed Time Reason: Patient unwilling to participate (Pt declines due to fatigue from  pain medication)   Pt politely declines therapy reporting feeling excessive fatigue secondary to pain medication.   Annia Kilts PT, DPT 04/16/2024, 1:21 PM

## 2024-04-16 NOTE — Progress Notes (Signed)
 Physical Therapy Session Note  Patient Details  Name: Nathaniel Hobbs MRN: [id number] Date of Birth: 11-28-1961  Today's Date: 04/16/2024 PT Individual Time: 4098-1191 PT Individual Time Calculation (min): 40 min   Short Term Goals: Week 1:  PT Short Term Goal 1 (Week 1): Pt will transfer sup to sit w/ min A PT Short Term Goal 2 (Week 1): Pt will transfer sit to stand w/ CGA PT Short Term Goal 3 (Week 1): Pt will amb w/ RW x 100 and C/S PT Short Term Goal 4 (Week 1): Pt will negotiate stairs w/ 1 rail and min A.  Skilled Therapeutic Interventions/Progress Updates: Pt presented in recliner, agreeable to therapy. Pt denies pain at start of session. Pt noted to have shoes present during session. Pt stood with CGA and stepping into shoes with RW support. Pt then ambulated to day room with CGA and RW. Pt noted to have good knee control with only intermittent hyperextension. With fatigue pt noted to have increased ankle inversion and decreased L foot clearance however no LOB. Pt then worked on Sit to stand with RLE on 2in block for increased LLE recruitment and balance challenge 2 x 5. Pt was consistently CGA however with fatigue required light facilitation for increased anterior weight shifting and noted increased use of BUE to push off from mat. Performed third set from elevated mat however without use of UE. Pt noted significant increased effort required with 3rd set. After brief rest pt ambulated back to room in same manner as prior with emphasis on focusing on avoiding hyperextension and although inversion noted with fatigue pt was able to maintain adequate foot clearance. Pt returned to recliner at end of session and left with call bell within reach and current needs met.      Therapy Documentation Precautions:  Precautions Precautions: Fall, Cervical Required Braces or Orthoses: Other Brace Other Brace: soft collar for comfort Restrictions Weight Bearing Restrictions Per Provider Order:  No General: PT Amount of Missed Time (min): 45 Minutes PT Missed Treatment Reason: Patient unwilling to participate (Pt declines due to fatigue from  pain medication) Vital Signs:   Pain: Pain Assessment Pain Score: 0-No pain  Therapy/Group: Individual Therapy  Kabrina Christiano 04/16/2024, 4:08 PM

## 2024-04-16 NOTE — Plan of Care (Signed)
  Problem: SCI BOWEL ELIMINATION Goal: RH STG MANAGE BOWEL WITH ASSISTANCE Description: STG Manage Bowel with mod I  Assistance. Outcome: Progressing Goal: RH STG SCI MANAGE BOWEL WITH MEDICATION WITH ASSISTANCE Description: STG SCI Manage bowel with medication with mod I assistance. Outcome: Progressing   Problem: RH SAFETY Goal: RH STG ADHERE TO SAFETY PRECAUTIONS W/ASSISTANCE/DEVICE Description: STG Adhere to Safety Precautions With  cues Assistance/Device. Outcome: Progressing   Problem: RH PAIN MANAGEMENT Goal: RH STG PAIN MANAGED AT OR BELOW PT'S PAIN GOAL Description: Manage pain < 4 with prns Outcome: Progressing

## 2024-04-16 NOTE — Patient Care Conference (Signed)
 Inpatient RehabilitationTeam Conference and Plan of Care Update Date: 04/15/2024   Time: 1152 am    Patient Name: Nathaniel Hobbs      Medical Record Number: [id number]  Date of Birth: [birth year] Sex: Male         Room/Bed: 4W02C/4W02C-01 Payor Info: Payor: Raina Bunting / Plan: [employer] [facility] [employer]  / Product Type: *No Product type* /    Admit Date/Time:  04/08/2024  1:33 PM  Primary Diagnosis:  Cervical myelopathy [facility])  Hospital Problems: Principal Problem:   Cervical myelopathy [facility])    Expected Discharge Date: Expected Discharge Date: 04/23/24  Team Members Present: Physician leading conference: Dr. Celia Coles Social Worker Present: Norval Been, LCSWA Nurse Present: Jerene Monks, RN PT Present: Gita Lamb, PT OT Present: Nila Barth, OT     Current Status/Progress Goal Weekly Team Focus  Bowel/Bladder     Constipation vs  neurogenic bowels; possible neurogenic bladder; urgency pt remains continent x2   routine toileting q4hrs and prn    Swallow/Nutrition/ Hydration               ADL's   Min A LB ADLs, bathing, toileting, SBA UB ADLs   mod I   LB ADL retraining, toileting simulation tasks, BUE strength/coordination    Mobility   transfers and gait with CGA with RW, min a with no AD. He feels limited by spasticity and fear of falling and difficulty trusting LLE.   mod I overall  spasticity management, gait, dynamic balance    Communication                Safety/Cognition/ Behavioral Observations               Pain   Pt currently having no complaints of pain, has gotten up to a 6 but relieved with oxy   Pt remains with pain controlled under pain scale level 4   routine pain assessments every shift and prn    Skin   Skin is intact besides scabs from previous fall on BLE and neck incision with no issues   Pt remains with skin inact  Routine skin assessments every shift and prn      Discharge Planning:  Home with wife who does work 8-4;30  pm, can take time off if needed. Daughter can be there unitl 3:00 so 1 1/2 hours not covered. Awaiting team's recommendations   Team Discussion: Patient admitted post ACDF secondary to cervical stenosis/myelopathy. Patient with pain, spasticity, sleepiness, blood pressure issues: medications adjusted by MD. Patient limited by fear of falling and difficulty with LLE movement.   Patient on target to meet rehab goals: yes, Patient requires SBA with UB ADLs, Min A LB ADLs, Patient requires CGA with transfers using a RW and min a with no AD. Overall goals are set for Mod I at discharge.   *See Care Plan and progress notes for long and short-term goals.   Revisions to Treatment Plan:  N/a   Teaching Needs:  Safety, medications, toileting, transfers, etc.   Current Barriers to Discharge: Decreased caregiver support and Neurogenic bowel and bladder  Possible Resolutions to Barriers:  Family Education Outpatient follow-up DME: [facility]     Medical Summary Current Status: taking oxy q4 hours per nursing; incision looks good; continent B/B but has urgency;  Barriers to Discharge: Self-care education;Spasticity;Neurogenic Bowel & Bladder;Medical stability;Weight bearing restrictions;Uncontrolled Pain  Barriers to Discharge Comments: limited by sensory deficits; spasticity; post op pain; fear of falling; goals mod I Possible Resolutions  to [employer] Focus: increased baclofen - to 15 mg TID- also  increased Senna- can add to bowel urgency, but constipated due to pain meds and baclofen - d/c 4/30   Continued Need for Acute Rehabilitation Level of Care: The patient requires daily medical management by a physician with specialized training in physical medicine and rehabilitation for the following reasons: Direction of a multidisciplinary physical rehabilitation program to maximize functional independence : Yes Medical management of patient stability for increased activity during participation in  an intensive rehabilitation regime.: Yes Analysis of laboratory values and/or radiology reports with any subsequent need for medication adjustment and/or medical intervention. : Yes   I attest that I was present, lead the team conference, and concur with the assessment and plan of the team.   Jerene Monks  04/15/2024, 1152 am

## 2024-04-16 NOTE — Progress Notes (Signed)
 Physical Therapy Session Note  Patient Details  Name: Breckin Savannah MRN: [id number] Date of Birth: 03-25-1961  Short Term Goals: Week 1:  PT Short Term Goal 1 (Week 1): Pt will transfer sup to sit w/ min A PT Short Term Goal 2 (Week 1): Pt will transfer sit to stand w/ CGA PT Short Term Goal 3 (Week 1): Pt will amb w/ RW x 100 and C/S PT Short Term Goal 4 (Week 1): Pt will negotiate stairs w/ 1 rail and min A.  Skilled Therapeutic Interventions/Progress Updates:   Attempted to see pt to make up missed minutes. Pt seated in recliner with eyes closed. Upon wakening, pt reported trying to get comfortable after taking pain medication and feeling too "out of it" for therapy at this time. Encouraged pt to participate in final OT session at 2:45pm and pt agreed.   Therapy Documentation Precautions:  Precautions Precautions: Fall, Cervical Required Braces or Orthoses: Other Brace Other Brace: soft collar for comfort Restrictions Weight Bearing Restrictions Per Provider Order: No  Therapy/Group: Individual Therapy Nicolas Barren Zaunegger Nena Bank PT, DPT 04/16/2024, 2:01 PM

## 2024-04-17 LAB — CBC WITH DIFFERENTIAL/PLATELET
Abs Immature Granulocytes: 0.01 10*3/uL (ref 0.00–0.07)
Basophils Absolute: 0.1 10*3/uL (ref 0.0–0.1)
Basophils Relative: 2 %
Eosinophils Absolute: 0.1 10*3/uL (ref 0.0–0.5)
Eosinophils Relative: 2 %
HCT: 38 % — ABNORMAL LOW (ref 39.0–52.0)
Hemoglobin: 12.9 g/dL — ABNORMAL LOW (ref 13.0–17.0)
Immature Granulocytes: 0 %
Lymphocytes Relative: 26 %
Lymphs Abs: 1.7 10*3/uL (ref 0.7–4.0)
MCH: 30.5 pg (ref 26.0–34.0)
MCHC: 33.9 g/dL (ref 30.0–36.0)
MCV: 89.8 fL (ref 80.0–100.0)
Monocytes Absolute: 0.6 10*3/uL (ref 0.1–1.0)
Monocytes Relative: 8 %
Neutro Abs: 4.2 10*3/uL (ref 1.7–7.7)
Neutrophils Relative %: 62 %
Platelets: 231 10*3/uL (ref 150–400)
RBC: 4.23 MIL/uL (ref 4.22–5.81)
RDW: 12.9 % (ref 11.5–15.5)
WBC: 6.6 10*3/uL (ref 4.0–10.5)
nRBC: 0 % (ref 0.0–0.2)

## 2024-04-17 LAB — BASIC METABOLIC PANEL WITH GFR
Anion gap: 8 (ref 5–15)
BUN: 8 mg/dL (ref 6–20)
CO2: 25 mmol/L (ref 22–32)
Calcium: 8.7 mg/dL — ABNORMAL LOW (ref 8.9–10.3)
Chloride: 107 mmol/L (ref 98–111)
Creatinine, Ser: 0.73 mg/dL (ref 0.61–1.24)
GFR, Estimated: >60 mL/min (ref >60)
Glucose, Bld: 105 mg/dL — ABNORMAL HIGH (ref 70–99)
Potassium: 3.8 mmol/L (ref 3.5–5.1)
Sodium: 140 mmol/L (ref 135–145)

## 2024-04-17 MED ORDER — POLYETHYLENE GLYCOL 3350 17 G PO PACK: 17.0000 g | PACK | Freq: Every day | ORAL | Status: AC | PRN

## 2024-04-17 MED ORDER — SENNA 8.6 MG PO TABS
2.0000 | ORAL_TABLET | Freq: Every day | ORAL | Status: DC
  Administered 2024-04-18 – 2024-04-23 (×6): 17.2 mg via ORAL
  Filled 2024-04-17 (×6): qty 2

## 2024-04-17 MED ORDER — HYDROCODONE-ACETAMINOPHEN 5-325 MG PO TABS
1.0000 | ORAL_TABLET | ORAL | Status: DC | PRN
  Administered 2024-04-18 – 2024-04-23 (×3): 1 via ORAL
  Filled 2024-04-17 (×3): qty 1

## 2024-04-17 NOTE — Telephone Encounter (Signed)
 Please let them know I faxed the placard form and also faxed this to be scanned into his chart.

## 2024-04-17 NOTE — Plan of Care (Signed)
  Problem: Consults Goal: RH SPINAL CORD INJURY PATIENT EDUCATION Description:  See Patient Education module for education specifics.  Outcome: Progressing   Problem: RH SAFETY Goal: RH STG ADHERE TO SAFETY PRECAUTIONS W/ASSISTANCE/DEVICE Description: STG Adhere to Safety Precautions With  cues Assistance/Device. Outcome: Progressing   Problem: RH PAIN MANAGEMENT Goal: RH STG PAIN MANAGED AT OR BELOW PT'S PAIN GOAL Description: Manage pain < 4 with prns Outcome: Progressing   Problem: RH KNOWLEDGE DEFICIT SCI Goal: RH STG INCREASE KNOWLEDGE OF SELF CARE AFTER SCI Description: Patient and Spouse will be able to manage care at discharge using educational resources independently Outcome: Progressing

## 2024-04-17 NOTE — Progress Notes (Signed)
 Physical Therapy Session Note  Patient Details  Name: Nathaniel Hobbs MRN: [id number] Date of Birth: May 15, 1961  Today's Date: 04/17/2024 PT Individual Time: 0915-0957 PT Individual Time Calculation (min): 42 min   Short Term Goals: Week 1:  PT Short Term Goal 1 (Week 1): Pt will transfer sup to sit w/ min A PT Short Term Goal 1 - Progress (Week 1): Met PT Short Term Goal 2 (Week 1): Pt will transfer sit to stand w/ CGA PT Short Term Goal 2 - Progress (Week 1): Met PT Short Term Goal 3 (Week 1): Pt will amb w/ RW x 100 and C/S PT Short Term Goal 3 - Progress (Week 1): Met PT Short Term Goal 4 (Week 1): Pt will negotiate stairs w/ 1 rail and min A. PT Short Term Goal 4 - Progress (Week 1): Met   Skilled Therapeutic Interventions/Progress Updates:  Pt trialling the foot up brace for LLE. Discussed the overall goal of the brace, limitations and plan going forward. Also discussed with PTA and planning to trial this brace the next couple days and then make a decision. When pt concentrates, is able to control knee and ankle and reports the foot up brace really helps with toe clearance. He is concerned about donning it (or any brace though). NMR during gait to and from therapy with RW with overall close supervision to CGA as fatigued with focus on LLE clearance, knee control, and overall upright posture and balance x 150', cues provided. Instructed in stair negotiation for NMR for functional strengthening, balance, LLE coordination, and overall preparation for home with B rails initially and progressed to L handrail going sideways - pt overall CGA with occasional episode of min assist with descending due to awareness of LLE placement. (Completed 12 steps to simulate home environment). End of session returned to recliner and all needs in reach.   Therapy Documentation Precautions:  Precautions Precautions: Fall, Cervical Required Braces or Orthoses: Other Brace Other Brace: soft collar for  comfort Restrictions Weight Bearing Restrictions Per Provider Order: No  Pain: Premedicated for pain. No complaints currently.   Therapy/Group: Individual Therapy  [name], PT, DPT, CBIS  04/17/2024, 11:48 AM

## 2024-04-17 NOTE — Plan of Care (Signed)
  Problem: RH Wheelchair Mobility Goal: LTG Patient will propel w/c in controlled environment (PT) Description: LTG: Patient will propel wheelchair in controlled environment, # of feet with assist (PT) Outcome: Not Applicable Flowsheets (Taken 04/17/2024 0823) LTG: Pt will propel w/c in controlled environ  assist needed:: (D/c due to primary ambulator) -- Note: D/c due to primary ambulator    Problem: RH Wheelchair Mobility Goal: LTG Patient will propel w/c in home environment (PT) Description: LTG: Patient will propel wheelchair in home environment, # of feet with assistance (PT). Flowsheets (Taken 04/17/2024 [phone]) LTG: Pt will propel w/c in home environ  assist needed:: (D/c due to primary ambulator) -- Note: D/c due to primary ambulator    Problem: RH Stairs Goal: LTG Patient will ambulate up and down stairs w/assist (PT) Description: LTG: Patient will ambulate up and down # of stairs with assistance (PT) Flowsheets (Taken 04/17/2024 0823) LTG: Pt will ambulate up/down stairs assist needed:: Supervision/Verbal cueing LTG: Pt will  ambulate up and down number of stairs: 1 step for home entry

## 2024-04-17 NOTE — Progress Notes (Signed)
 PROGRESS NOTE   Subjective/Complaints:  Pt had episode yesterday that got into "lala land" after oxycodone - wants ot stop Oxy and go to tylenol  as well as Norco- gets sick on Tramadol.    Having muscle tightness at night- but is taking baclofen  as ordered.  Pain 1/10 this AM- took 2 tylenol .   Too many Bms yesterday- will reduce Senna  ROS:    Pt denies SOB, abd pain, CP, N/V/C/ (+)D, and vision changes   Bladder urgency- continued + constipation -improved   Objective:   No results found.  Recent Labs    04/17/24 0623  WBC 6.6  HGB 12.9*  HCT 38.0*  PLT 231    Recent Labs    04/17/24 0623  NA 140  K 3.8  CL 107  CO2 25  GLUCOSE 105*  BUN 8  CREATININE 0.73  CALCIUM  8.7*     Intake/Output Summary (Last 24 hours) at 04/17/2024 0855 Last data filed at 04/17/2024 0700 Gross per 24 hour  Intake 956 ml  Output --  Net 956 ml        Physical Exam: Vital Signs Blood pressure (!) 145/69, pulse (!) 58, temperature 97.7 F (36.5 C), temperature source Oral, resp. rate 18, height 5\' 9"  (1.753 m), weight 98 kg, SpO2 96%.        General: awake, alert, appropriate, sitting u in bedside chair; nurse also joined us ; NAD HENT: conjugate gaze; oropharynx moist CV: regular rate and rhythm- borderline low; no JVD Pulmonary: CTA B/L; no W/R/R- good air movement GI: soft, NT, ND, (+)BS- normoactive Psychiatric: appropriate; interactive Neurological: Ox3   LUE MAS of 2 today- about the same today- no change- RUE 1  MAS- LE's 2- before AM meds- same Extremities: 1+ edema LUE and no LE edema this AM Psych: Pt's affect is appropriate. Pt is cooperative Skin: Surgical incision CDI Neuro:     Mental Status: AAOx3,  no apparent cognitive deficits noted CRANIAL NERVES: 2-12 grossly intact     MOTOR: RUE: 4/5 Deltoid, 4/5 Biceps, 4/5 Triceps, 4-/5 Grip LUE: 3/5 Deltoid, 4-/5 Biceps, 4-/5 Triceps, 3/5  Grip RLE: HF 4-/5, KE 4/5, ADF 4-/5, APF 4-/5 LLE: HF 3/5, KE 4-/5, ADF 4-/5, APF 4-/5  SENSORY: Intact but altered light touch all 4 extremities   MSK: No joint tenderness/swelling noted.    Assessment/Plan: 1. Functional deficits which require 3+ hours per day of interdisciplinary therapy in a comprehensive inpatient rehab setting. Physiatrist is providing close team supervision and 24 hour management of active medical problems listed below. Physiatrist and rehab team continue to assess barriers to discharge/monitor patient progress toward functional and medical goals  Care Tool:  Bathing              Bathing assist Assist Level: Supervision/Verbal cueing     Upper Body Dressing/Undressing Upper body dressing   What is the patient wearing?: Pull over shirt    Upper body assist Assist Level: Supervision/Verbal cueing    Lower Body Dressing/Undressing Lower body dressing      What is the patient wearing?: Pants     Lower body assist Assist for lower body dressing: Contact Guard/Touching assist  Toileting Toileting    Toileting assist Assist for toileting: Contact Guard/Touching assist     Transfers Chair/bed transfer  Transfers assist     Chair/bed transfer assist level: Supervision/Verbal cueing     Locomotion Ambulation   Ambulation assist      Assist level: Contact Guard/Touching assist Assistive device: Walker-rolling Max distance: 150'   Walk 10 feet activity   Assist     Assist level: Contact Guard/Touching assist Assistive device: Walker-rolling   Walk 50 feet activity   Assist    Assist level: Contact Guard/Touching assist Assistive device: Walker-rolling    Walk 150 feet activity   Assist Walk 150 feet activity did not occur: Safety/medical concerns  Assist level: Contact Guard/Touching assist Assistive device: Walker-rolling    Walk 10 feet on uneven surface  activity   Assist     Assist level:  Minimal Assistance - Patient > 75% Assistive device: Development worker, international aid     Assist Is the patient using a wheelchair?: Yes (Per PT documentation) Type of Wheelchair: Manual    Wheelchair assist level: Dependent - Patient 0%      Wheelchair 50 feet with 2 turns activity    Assist        Assist Level: Dependent - Patient 0%   Wheelchair 150 feet activity     Assist      Assist Level: Dependent - Patient 0%   Blood pressure (!) 145/69, pulse (!) 58, temperature 97.7 F (36.5 C), temperature source Oral, resp. rate 18, height 5\' 9"  (1.753 m), weight 98 kg, SpO2 96%.   Medical Problem List and Plan: 1. Functional deficits secondary to cervical stenosis/myelopathy status post ACDF C3-5 04/03/2024             -patient may shower, cover incision please             -ELOS/Goals: 14-16 days, PT/OT supervision/Mod I            D/c4/30  Con't CIR PT and OT             -PFRAOs b/l 2.  Antithrombotics: -DVT/anticoagulation:  Pharmaceutical: Lovenox .  Check vascular             -antiplatelet therapy: Aspirin  81 mg daily 3. Pain Management: Neurontin  400 mg daily, Robaxin  500 mg every 6 hours as needed, oxycodone   as needed  4/17therapy reporting patient has been having some left knee pain from chronic arthritis, will start Voltaren   Order K-pad  4/18 neck pain and knee pain much improved today, continue current regimen  4/20- Reports neck pain is tolerable-   4/22- Neck pain/overall pain doing better, except for spasticity pain  4/23- taking pain meds q4 hours around the clock per nursing- will d/w pt when it's possible to try and reduce dosing  4/24- will change Oxy to Norco  and will take tylenol  when can go without Norco 4. Mood/Behavior/Sleep: Provide emotional support             -antipsychotic agents: N/A 5. Neuropsych/cognition: This patient is capable of making decisions on his own behalf. 6. Skin/Wound Care: Routine skin checks 7.  Fluids/Electrolytes/Nutrition: Routine in and outs with follow-up chemistries 8.  Constipation vs Neurogenic bowel              - Scheduled MiraLAX  twice daily and continue Senokot.  Consider suppository bowel program if not having regular Bms  -Pt doesn't want to try suppository, will do sorbitol  60ml for now  -4/17 patient had  very large bowel movement yesterday, continent.   Continue MiraLAX  and Senokot and monitor. -4/18 LBM was after sorbitol - continent but was was liquid after this medication, will check abdominal xray to assess stool burden.  Will order dulcolax capsule today, pt reports this has worked will for him in the past. 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 Continue suppository at night PRN for now pt resistant to trying. 4/19- had blow out last night- however I discussed with pt about neurogenic bowel as well as constipation from baclofen  playing a role- -will increase Senna to 3 tabs/day since increasing baclofen  4/20- Feels like could have BM this AM- but if doesn't was hoping for another Dulcolax PO- explained increased his Senna, but if needs additional meds, let nursing know 4/21 Reports good BM yesterday, continue current regimen  4/22- had a "blow out" that barely got to bathroom for last night- but we discussed if we go up on Baclofen , could balance these issues out. Wait to reduce Senna 4/23- LBM 2 days ago- denies constipation- wants ot wait for intervention 4/24- will decrease Senna to 2 tabs/day 9.  Hypertension.  Vasotec  10 mg twice daily.  Monitor with increased mobility             -4/17-19 controlled overall continue to monitor  4/21-4/22 overall controlled, continue current regimen   4/23- BP overall controlled- a little elevated this AM- also bradycardic this AM- will monitor Sx's and vitals  4/24- BP very slightly elevated this AM- otherwise controlled- con't to monitor    04/17/2024    4:42 AM 04/16/2024    8:27 PM 04/16/2024    8:13 PM  Vitals with BMI   Systolic 145 139 784  Diastolic 69 61 61  Pulse 58  57    10.  CAD status post PCI 2000 08/2012.  Follow-up with cardiology services Dr. Burney Carter. Denies CP 11.  Hyperlipidemia.  Lipitor  12.  Obesity.  BMI 34.18.  Dietary follow-up  4/19- BMI down to 31.91 13. Possible Neurogenic bladder             -PVR/bladder scans IC if greater than 350  - 4/16 Will ask nursing check bladder scan/PVR  -4/17 patient did have a bladder scan yesterday to 12, will asked nursing to try to check PVRs  -4/18 patient had 3 PVRs all under 100, and continent  4/19- will d/c bladder scans  4/21 remains continent   4/23- Having bladder urgency- will check CBC with diff in AM and if WBC elevated, will check U/A- however is common with SCI patients-   4/24- WBC 6.6k 14) Spasticity - Will start baclofen  5 mg 3 times daily -4/16-8 improved continue current regimen and monitor 4/19- Spasticity sounds like it's progressing- will increase Baclofen  to 10 mg TID  4/20- pt reports is a little better, however really wants to increase as soon as possible- I explained we can increase on Tuesday AM to 15 mg TID  4/22- Increasing Baclofen  to 15 mg TID- for continued spasticity- educated could make more sleepy/more constipated, but just increased Senna, so should help with that.   4/23- pt was tired yesterday AM, but it got better- con't regimen  I spent a total of  38   minutes on total care today- >50% coordination of care- due to  D/w nursing with meds changes for pain and bowels  LOS: 9 days A FACE TO FACE EVALUATION WAS PERFORMED  Alyzae Hawkey 04/17/2024, 8:55 AM

## 2024-04-17 NOTE — Progress Notes (Signed)
 Physical Therapy Session Note  Patient Details  Name: Nathaniel Hobbs MRN: [id number] Date of Birth: [birth year]  Today's Date: 04/17/2024 PT Individual Time: 0805-0900 PT Individual Time Calculation (min): 55 min   Short Term Goals: Week 1:  PT Short Term Goal 1 (Week 1): Pt will transfer sup to sit w/ min A PT Short Term Goal 1 - Progress (Week 1): Met PT Short Term Goal 2 (Week 1): Pt will transfer sit to stand w/ CGA PT Short Term Goal 2 - Progress (Week 1): Met PT Short Term Goal 3 (Week 1): Pt will amb w/ RW x 100 and C/S PT Short Term Goal 3 - Progress (Week 1): Met PT Short Term Goal 4 (Week 1): Pt will negotiate stairs w/ 1 rail and min A. PT Short Term Goal 4 - Progress (Week 1): Met  Skilled Therapeutic Interventions/Progress Updates: Pt presented in recliner agreeable to therapy. Pt states denies pain this am. PTA provided shoes and pt donned in standing with CGA (slip ons). Pt then ambulated to day room with RW and CGA. Pt noted to continue to demonstrate improving knee control but continues to demonstrate significant inversion and decreased toe clearance. In day room discussed use of foot up brace with pt agreeable to trial. PTA obtained and pt ambulated ~34ft with RW and foot up brace. Pt did demonstrate improved foot alignment and toe clearance. Will continue to trial. Pt then worked on LLE strengthening performing hamstring pulls with green theraband 2 x 15 and standing TKE with mat being used as biofeedback to minimize hyperextension 2 x 15. Pt also worked on standing balance on [employer] reaching grasping red and green clothes pins with RW present for safety however only touching intermittently with RUE. Pt was able to tolerate donning shoes with foot up brace and ambulated back to room with RW and CGA. Pt noted to have consistently foot clearance with LUE and decreased inversion. In room pt returned to recliner at end of session and left with call bell within reach and needs met.     Therapy Documentation Precautions:  Precautions Precautions: Fall, Cervical Required Braces or Orthoses: Other Brace Other Brace: soft collar for comfort Restrictions Weight Bearing Restrictions Per Provider Order: No General:   Vital Signs:   Pain: Pain Assessment Pain Scale: Faces Pain Score: 0-No pain Faces Pain Scale: Hurts a little bit Pain Location: Neck Pain Intervention(s): Medication (See eMAR)   Therapy/Group: Individual Therapy  Mallisa Alameda 04/17/2024, 11:19 AM

## 2024-04-17 NOTE — Progress Notes (Signed)
 Occupational Therapy Weekly Progress Note  Patient Details  Name: Lillard Bailon MRN: [id number] Date of Birth: October 17, 1961  Beginning of progress report period: April 09, 2024 End of progress report period: April 17, 2024  Today's Date: 04/17/2024 OT Individual Time: 7829-5621 OT Individual Time Calculation (min): 55 min    Patient has met 3 of 3 short term goals.  Mr. Bunda has demonstrated increased ADL independence and participation, being at overall SBA level as well as with functional transfers/mobility with RW.   Patient continues to demonstrate the following deficits: muscle weakness and muscle joint tightness, decreased cardiorespiratoy endurance, impaired timing and sequencing, unbalanced muscle activation, ataxia, and decreased coordination, and decreased standing balance, decreased postural control, and decreased balance strategies and therefore will continue to benefit from skilled OT intervention to enhance overall performance with BADL and Reduce care partner burden.  Patient progressing toward long term goals..  Continue plan of care.  OT Short Term Goals Week 1:  OT Short Term Goal 1 (Week 1): Pt will complete LB dressing with Min A with AE as necessary OT Short Term Goal 1 - Progress (Week 1): Met OT Short Term Goal 2 (Week 1): Pt will complete toilet ttransfer at [facility] with AE as necessary OT Short Term Goal 2 - Progress (Week 1): Met OT Short Term Goal 3 (Week 1): Pt will complete UB dressing with CGA OT Short Term Goal 3 - Progress (Week 1): Met Week 2:  OT Short Term Goal 1 (Week 2): STG=LTG d/t ELOS  Skilled Therapeutic Interventions/Progress Updates:      Therapy Documentation Precautions:  Precautions Precautions: Fall, Cervical Required Braces or Orthoses: Other Brace Other Brace: soft collar for comfort Restrictions Weight Bearing Restrictions Per Provider Order: No General: "Can we wash up before the advocate meeting?" Pt seated in recliner upon OT  arrival, agreeable to OT.  Pain: no pain reported  ADL: OT providing skilled intervention on ADL retraining in order to increase independence with tasks and increase activity tolerance. Pt completed the following tasks at the current level of assist: UB dressing: SBA in standing to don/doff overhead shirt LB dressing: SBA overall, able to thread BLE into pants and stand to manage over waist, underwear and shorts Footwear: Mod A, able to manage doffing socks, required assistance donning socks Bathing: SBA overall, able to stand at grab bar to wash peri area, seated for rest of shower Transfers: Pt able to ambulate without RW at CGA from recliner ><TTB with no LOB/SOB   Pt seated in W/C at end of session with W/C alarm donned, call light within reach and 4Ps assessed.    Therapy/Group: Individual Therapy  Nila Barth, OTD, OTR/L 04/17/2024, 12:08 PM

## 2024-04-17 NOTE — Progress Notes (Signed)
 Physical Therapy Weekly Progress Note  Patient Details  Name: Nathaniel Hobbs MRN: [id number] Date of Birth: [birth year]  Beginning of progress report period: April 09, 2024 End of progress report period: April 17, 2024  Today's Date: 04/17/2024 PT Individual Time: 1336-1440 PT Individual Time Calculation (min): 64 min    Patient has met 4 of 4 short term goals.  Pt is making good progress towards overall LTGs. He is currently supervision to CGA overall for mobility using RW. We are trialling the foot up brace for LLE to increase foot clearance. Knee hyperextension has improved and pt is demonstrating overall improved control. Occasional ankle inversion is noted during gait, so continuing to address best bracing option for deficits and pt adherence to wear. Recommending RW at this time due to fall risk and increased spasticity L > R. Plan to d/c home next week with supervision available at d/c from family.  Patient continues to demonstrate the following deficits muscle weakness and muscle joint tightness, decreased cardiorespiratoy endurance, abnormal tone, unbalanced muscle activation, and decreased coordination, and decreased sitting balance, decreased standing balance, decreased postural control, decreased balance strategies, and difficulty maintaining precautions and therefore will continue to benefit from skilled PT intervention to increase functional independence with mobility.  Patient progressing toward long term goals..  Plan of care revisions: discharged w/c mobility goal due to primary ambulator. Modified stair goal to reflect home access with recommendation for supervision due to fall risk.  PT Short Term Goals Week 1:  PT Short Term Goal 1 (Week 1): Pt will transfer sup to sit w/ min A PT Short Term Goal 1 - Progress (Week 1): Met PT Short Term Goal 2 (Week 1): Pt will transfer sit to stand w/ CGA PT Short Term Goal 2 - Progress (Week 1): Met PT Short Term Goal 3 (Week 1): Pt will  amb w/ RW x 100 and C/S PT Short Term Goal 3 - Progress (Week 1): Met PT Short Term Goal 4 (Week 1): Pt will negotiate stairs w/ 1 rail and min A. PT Short Term Goal 4 - Progress (Week 1): Met Week 2:  PT Short Term Goal 1 (Week 2): = LTGS  Skilled Therapeutic Interventions/Progress Updates:  Ambulation/gait training;Community reintegration;Neuromuscular re-education;Stair training;UE/LE Strength taining/ROM;Therapeutic Activities;UE/LE Coordination activities;Pain management;Discharge planning;Balance/vestibular training;Functional mobility training;Therapeutic Exercise;DME/adaptive equipment instruction;Skin care/wound management;Splinting/orthotics;Wheelchair propulsion/positioning;Patient/family education;Disease management/prevention;Psychosocial support   Therapy Documentation Precautions:  Precautions Precautions: Fall, Cervical Required Braces or Orthoses: Other Brace Other Brace: soft collar for comfort Restrictions Weight Bearing Restrictions Per Provider Order: No  Pain:  No reports of pain.   Session started a few min late due to pt in a patient council meeting. Pt in w/c and used for transport to ADL apartment for time management. Engaged in household mobility retraining including bed mobility and transfers on and off regular bed to simulate home set up as well as low surface recliner to simulate his couch. Per his report, there will not be enough room for RW next to the bed, so will need to side step without device - practiced this with overall CGA and recommended wife to be present initially for this part of the mobility due to no device. Pt performed bed mobility with supervision initially but with poor technique (twisting, momentum and maintaining LLE in extension). Reviewed correct technique, log roll to protect back and bending LLE during rolling and initially required light min for trunk activation and progressed to supervision on second trial. Recommended to continue to  practice this here in  the bed in the hospital as well to work on technique. Discussed and demonstrated ankle alphabet, DF and PF exercises, hamstring stretch, and knee to chest as ways to address ankle strength and flexibility in LEs for carryover to gait and balance. Simulated couch transfer to low recliner with supervision/light CGA for sit > stand from low surface. Pt was not wearing foot up brace this session, so noticed that R ankle inversion more prevalent and decreased foot clearance noted during gait in the room and then down to the next therapy gym. Simulated car transfer to [facility] height with CGA for transfer but min assist needed for LLE into the car due to limited ROM. Recommended to have wife bring his car (likely can do on Sunday) to practice real car transfer prior to d/c for better simulation. Nustep for NMR for reciprocal movement pattern retraining, decreasing spasticity, and functional strengthening with combo of BUE/BLE and BLE only on hill program with 2 hills with level ranging from 4-10 for resistance. (Avg 40 steps per min, x 10 min). NMR during transfers and short distance gait without device with focus on balance, LLE coordination/control, and overall functional mobility back in room to transfer back to recliner at end of session.    Therapy/Group: Individual Therapy  Nathaniel Hobbs, PT, DPT, CBIS  04/17/2024, 2:43 PM

## 2024-04-17 NOTE — Evaluation (Signed)
 Recreational Therapy Assessment and Plan  Patient Details  Name: Nathaniel Hobbs MRN: [id number] Date of Birth: 09/21/1961 Today's Date: 04/17/2024  Rehab Potential:  Good ELOS:   d/c 4/30  Assessment  Hospital Problem: Principal Problem:   Cervical myelopathy [facility])     Past Medical History:      Past Medical History:  Diagnosis Date   High cholesterol     Hypertension          Past Surgical History:       Past Surgical History:  Procedure Laterality Date   ANTERIOR CERVICAL DECOMP/DISCECTOMY FUSION N/A 04/03/2024    Procedure: ANTERIOR CERVICAL DECOMPRESSION/DISCECTOMY FUSION 2 LEVELS;  Surgeon: Nathaniel Munch, MD;  Location: [facility] ORS;  Service: Neurosurgery;  Laterality: N/A;          Assessment & Plan Clinical Impression: Nathaniel Hobbs is a 63 year old right handed male with history significant for hypertension, hyperlipidemia, CAD status post PCI 2009, 2013 followed by Dr. Burney Hobbs maintained on low-dose aspirin , prediabetes.  Per chart review patient lives with spouse.  1 level home one-step to entry.  Independent prior to admission and working full-time.  Presented to [facility] 04/03/2024 experiencing back pain with difficulty getting up from a kneeling position for several months however over the last few weeks he has noticed a significant worsening as well as paresthesia of his bilateral upper extremities and significant weakness of his left arm and left leg.  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  Cord signal abnormality at C3-4 and C4-5 along with cord volume loss suggestive of myelomalacia.  Multilevel foraminal stenosis, greatest and severe bilaterally at C3-4, on the right at C4-5 and bilaterally at C5-6.  Additional moderate to severe foraminal stenosis on the left at C4-5.   Underwent anterior cervical discectomy and fusion at C3/4 and C4-5.  Anterior cervical instrumentation at C3-5 placement of structural allograft 04/03/2024 per Dr. Jodeen Hobbs.  JP drain removed 04/07/2024.  He was cleared to begin Lovenox  for DVT prophylaxis 04/06/2024 pain management ongoing with the addition of scheduled Neurontin  with oxycodone  for breakthrough pain.  Patient feels like he is getting little stronger since her surgery.  Patient reports he has been having chronic issues with constipation, using his wife's Linzess or over-the-counter laxatives.  Reports he has been continent of bowel and bladder overall.  He did have incontinent episode when he was given a lot of laxatives and had a lot of diarrhea.  Therapy evaluations completed due to patient's decreased functional mobility was admitted for a comprehensive rehab program .  Patient transferred to CIR on 04/08/2024.     Pt presents with decreased activity tolerance, decreased functional mobility, decreased balance, decreased coordination Limiting pt's independence with leisure/community pursuits.  Met with pt today to discuss TR services including leisure education, activity analysis/modifications and stress management.  Also discussed the importance of social, emotional, spiritual health in addition to physical health and their effects on overall health and wellness.  Pt stated understanding.  Plan  Min 1 TR session >20 minutes during LOS  Recommendations for other services: None   Discharge Criteria: Patient will be discharged from TR if patient refuses treatment 3 consecutive times without medical reason.  If treatment goals not met, if there is a change in medical status, if patient makes no progress towards goals or if patient is  discharged from hospital.  The above assessment, treatment plan, treatment alternatives and goals were discussed and mutually agreed upon: by patient  Nathaniel Hobbs 04/17/2024, 8:29 AM

## 2024-04-17 NOTE — Telephone Encounter (Signed)
 Wife is aware that it has been completed and sent.

## 2024-04-18 MED ORDER — LIDOCAINE 5 % EX PTCH
2.0000 | MEDICATED_PATCH | CUTANEOUS | Status: DC
  Filled 2024-04-18 (×2): qty 2

## 2024-04-18 NOTE — Progress Notes (Signed)
 Recreational Therapy Session Note  Patient Details  Name: Nathaniel Hobbs MRN: [id number] Date of Birth: 13-Sep-1961 Today's Date: 04/18/2024  Pain: no c/o Skilled Therapeutic Interventions/Progress Updates:  Goal:  Pt will ambulate on various outdoor community surfaces using RW with CGA.  MET  Pt seated in recliner upon arrival.  Pt discussing the mornings events and sharing that he had taken himself to the bathroom this morning and then washed up at the sink.  Reviewed safety concerns and pt stated understanding.  PT joined session with an emphasis on community reintegration and returning to pts leisure interests.  Pt ambulated on paved, grass & mulched surfaces using RW with CGA.  Pt did required min cues for safety while ambulating with RW.  Discussed h/o falls at home and pt stating that he felt stronger and would like to practice fall recovery later today.  Therapy/Group: cotreatment Jennifer Payes 04/18/2024, 12:21 PM

## 2024-04-18 NOTE — Progress Notes (Signed)
 Orthopedic Tech Progress Note Patient Details:  Nathaniel Hobbs May 19, 1961 [id number] Order called into [facility] for AFO consult Patient ID: Reymundo Winship, male   DOB: [birth year], 63 y.o.   MRN: [id number]  Delynn Fill 04/18/2024, 9:35 AM

## 2024-04-18 NOTE — Progress Notes (Incomplete)
 Patient was assisted to recliner by NT,X2 but refused to have the chair alarm placed on him according to his assigned NT,

## 2024-04-18 NOTE — Progress Notes (Signed)
 Occupational Therapy Session Note  Patient Details  Name: Nathaniel Hobbs MRN: [id number] Date of Birth: 10-04-1961  Today's Date: 04/18/2024 OT Individual Time: 0950-1050 OT Individual Time Calculation (min): 60 min    Short Term Goals: Week 2:  OT Short Term Goal 1 (Week 2): STG=LTG d/t ELOS  Skilled Therapeutic Interventions/Progress Updates:    Pt received sitting in the recliner with no c/o pain and agreeable to OT session. 200 ft of functional mobility with the RW with CGA to the therapy gym. Occasional toe catch/drag on the LLE but pt was able to correct and identify with no LOB. He requested to work on a floor transfer to assess CLOF and ability to do so. OT provided a demonstration of the following sequence - lowering to the a floor mat, getting into full supine, transitioning into quadruped, and then kneeling, before pivot into sitting EOM- simulating floor to couch or chair at home. OT provided education on fall recovery, when to get up independently vs when to call for EMS after a fall. He was able to lower himself to the floor with only min A. OT providing cueing on careful adherence to cervical precautions and minimizing UE weightbearing. He attempted to roll out of modified prone and was able to do so but then was unable to even initiate any further step of the transfer. Extended time spent attempting to problem solve but ultimately pt was unable to get out of long sitting. +2 total A provided to transfer pt to the mat. Encouragement and reflection of progress completed with pt as he was quite disappointed in this. He was then taken outside via w/c to enjoy benefits of fresh air, including mood elevation and psychosocial adjustment. Discussed d/c planning and hospitalization. Pt returned to their room following. Pt was left sitting up in the recliner with all needs met and call bell within reach.     Therapy Documentation Precautions:  Precautions Precautions: Fall,  Cervical Required Braces or Orthoses: Other Brace Other Brace: soft collar for comfort Restrictions Weight Bearing Restrictions Per Provider Order: No  Therapy/Group: Individual Therapy  Una Ganser 04/18/2024, 10:33 AM

## 2024-04-18 NOTE — Progress Notes (Signed)
 PROGRESS NOTE   Subjective/Complaints:  Pt reports "lost the battle with the bed" last night- neck sore this AM as a result.   LBM yesterday- more formed Had more gas afterward- hard ot tell if gas or BM.  Still poor sensation in hands- Hx of L frozen shoulder.    ROS:    Pt denies SOB, abd pain, CP, N/V/C/D, and vision changes   Bladder urgency- continued + constipation -improved   Objective:   No results found.  Recent Labs    04/17/24 0623  WBC 6.6  HGB 12.9*  HCT 38.0*  PLT 231    Recent Labs    04/17/24 0623  NA 140  K 3.8  CL 107  CO2 25  GLUCOSE 105*  BUN 8  CREATININE 0.73  CALCIUM  8.7*    No intake or output data in the 24 hours ending 04/18/24 0757       Physical Exam: Vital Signs Blood pressure (!) 141/80, pulse (!) 55, temperature 98.4 F (36.9 C), temperature source Oral, resp. rate 18, height 5\' 9"  (1.753 m), weight 98 kg, SpO2 97%.        General: awake, alert, appropriate, sitting in bedside chair; somewhat stressed this AM; NAD HENT: conjugate gaze; oropharynx moist CV: regular rhythm, bradycardic rate; no JVD Pulmonary: CTA B/L; no W/R/R- good air movement GI: soft, NT, ND, (+)BS- normoactive Psychiatric: appropriate but upset about a situation- that he feels person needs education Neurological: Ox3 Very little sensation in hands B/L LUE MAS of 2 today- about the same today- no change- RUE 1  MAS- LE's 2- before AM meds- same Extremities: 1+ edema LUE and no LE edema this AM Psych: Pt's affect is appropriate. Pt is cooperative Skin: Surgical incision CDI Neuro:     Mental Status: AAOx3,  no apparent cognitive deficits noted CRANIAL NERVES: 2-12 grossly intact     MOTOR: RUE: 4/5 Deltoid, 4/5 Biceps, 4/5 Triceps, 4-/5 Grip LUE: 3/5 Deltoid, 4-/5 Biceps, 4-/5 Triceps, 3/5 Grip RLE: HF 4-/5, KE 4/5, ADF 4-/5, APF 4-/5 LLE: HF 3/5, KE 4-/5, ADF 4-/5, APF  4-/5  SENSORY: Intact but altered light touch all 4 extremities   MSK: No joint tenderness/swelling noted.    Assessment/Plan: 1. Functional deficits which require 3+ hours per day of interdisciplinary therapy in a comprehensive inpatient rehab setting. Physiatrist is providing close team supervision and 24 hour management of active medical problems listed below. Physiatrist and rehab team continue to assess barriers to discharge/monitor patient progress toward functional and medical goals  Care Tool:  Bathing              Bathing assist Assist Level: Supervision/Verbal cueing     Upper Body Dressing/Undressing Upper body dressing   What is the patient wearing?: Pull over shirt    Upper body assist Assist Level: Supervision/Verbal cueing    Lower Body Dressing/Undressing Lower body dressing      What is the patient wearing?: Pants     Lower body assist Assist for lower body dressing: Contact Guard/Touching assist     Toileting Toileting    Toileting assist Assist for toileting: Contact Guard/Touching assist     Transfers  Chair/bed transfer  Transfers assist     Chair/bed transfer assist level: Supervision/Verbal cueing     Locomotion Ambulation   Ambulation assist      Assist level: Contact Guard/Touching assist Assistive device: Walker-rolling Max distance: 150'   Walk 10 feet activity   Assist     Assist level: Supervision/Verbal cueing Assistive device: Orthosis, Walker-rolling   Walk 50 feet activity   Assist    Assist level: Contact Guard/Touching assist Assistive device: Walker-rolling, Orthosis    Walk 150 feet activity   Assist Walk 150 feet activity did not occur: Safety/medical concerns  Assist level: Contact Guard/Touching assist Assistive device: Walker-rolling, Orthosis    Walk 10 feet on uneven surface  activity   Assist     Assist level: Minimal Assistance - Patient > 75% Assistive device:  Walker-rolling   Wheelchair     Assist Is the patient using a wheelchair?: Yes (Per PT documentation) Type of Wheelchair: Manual    Wheelchair assist level: Dependent - Patient 0%      Wheelchair 50 feet with 2 turns activity    Assist        Assist Level: Dependent - Patient 0%   Wheelchair 150 feet activity     Assist      Assist Level: Dependent - Patient 0%   Blood pressure (!) 141/80, pulse (!) 55, temperature 98.4 F (36.9 C), temperature source Oral, resp. rate 18, height 5\' 9"  (1.753 m), weight 98 kg, SpO2 97%.   Medical Problem List and Plan: 1. Functional deficits secondary to cervical stenosis/myelopathy status post ACDF C3-5 04/03/2024             -patient may shower, cover incision please             -ELOS/Goals: 14-16 days, PT/OT supervision/Mod I            D/c4/30  Con't CIR PT and OT               -PFRAOs b/l 2.  Antithrombotics: -DVT/anticoagulation:  Pharmaceutical: Lovenox .  Check vascular             -antiplatelet therapy: Aspirin  81 mg daily 3. Pain Management: Neurontin  400 mg daily, Robaxin  500 mg every 6 hours as needed, oxycodone   as needed  4/17therapy reporting patient has been having some left knee pain from chronic arthritis, will start Voltaren   Order K-pad  4/18 neck pain and knee pain much improved today, continue current regimen  4/20- Reports neck pain is tolerable-   4/22- Neck pain/overall pain doing better, except for spasticity pain  4/23- taking pain meds q4 hours around the clock per nursing- will d/w pt when it's possible to try and reduce dosing  4/24- will change Oxy to Norco  and will take tylenol  when can go without Norco  4/25- doing OK- pain doing better except neck sore from bed- will try Lidoderm  patches 8pm to 8am 4. Mood/Behavior/Sleep: Provide emotional support             -antipsychotic agents: N/A 5. Neuropsych/cognition: This patient is capable of making decisions on his own behalf. 6. Skin/Wound  Care: Routine skin checks 7. Fluids/Electrolytes/Nutrition: Routine in and outs with follow-up chemistries 8.  Constipation vs Neurogenic bowel              - Scheduled MiraLAX  twice daily and continue Senokot.  Consider suppository bowel program if not having regular Bms  -Pt doesn't want to try suppository, will do sorbitol  60ml for  now  -4/17 patient had very large bowel movement yesterday, continent.   Continue MiraLAX  and Senokot and monitor. -4/18 LBM was after sorbitol - continent but was was liquid after this medication, will check abdominal xray to assess stool burden.  Will order dulcolax capsule today, pt reports this has worked will for him in the past. 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 Continue suppository at night PRN for now pt resistant to trying. 4/19- had blow out last night- however I discussed with pt about neurogenic bowel as well as constipation from baclofen  playing a role- -will increase Senna to 3 tabs/day since increasing baclofen  4/20- Feels like could have BM this AM- but if doesn't was hoping for another Dulcolax PO- explained increased his Senna, but if needs additional meds, let nursing know 4/21 Reports good BM yesterday, continue current regimen  4/22- had a "blow out" that barely got to bathroom for last night- but we discussed if we go up on Baclofen , could balance these issues out. Wait to reduce Senna 4/23- LBM 2 days ago- denies constipation- wants ot wait for intervention 4/24- will decrease Senna to 2 tabs/day 4/25- LBM yesterday- was more formed 9.  Hypertension.  Vasotec  10 mg twice daily.  Monitor with increased mobility             -4/17-19 controlled overall continue to monitor  4/21-4/22 overall controlled, continue current regimen   4/23- BP overall controlled- a little elevated this AM- also bradycardic this AM- will monitor Sx's and vitals  4/24- BP very slightly elevated this AM- otherwise controlled- con't to monitor  4/25- BP 141/80 this  AM- til mildly bradycardic- con't to monitor and med regimen    04/18/2024    4:59 AM 04/17/2024    8:04 PM 04/17/2024    6:45 PM  Vitals with BMI  Systolic 141 118 161  Diastolic 80 74 69  Pulse 55 52 59    10.  CAD status post PCI 2000 08/2012.  Follow-up with cardiology services Dr. Burney Carter. Denies CP 11.  Hyperlipidemia.  Lipitor  12.  Obesity.  BMI 34.18.  Dietary follow-up  4/19- BMI down to 31.91 13. Possible Neurogenic bladder             -PVR/bladder scans IC if greater than 350  - 4/16 Will ask nursing check bladder scan/PVR  -4/17 patient did have a bladder scan yesterday to 12, will asked nursing to try to check PVRs  -4/18 patient had 3 PVRs all under 100, and continent  4/19- will d/c bladder scans  4/21 remains continent   4/23- Having bladder urgency- will check CBC with diff in AM and if WBC elevated, will check U/A- however is common with SCI patients-   4/24- WBC 6.6k 14) Spasticity - Will start baclofen  5 mg 3 times daily -4/16-8 improved continue current regimen and monitor 4/19- Spasticity sounds like it's progressing- will increase Baclofen  to 10 mg TID  4/20- pt reports is a little better, however really wants to increase as soon as possible- I explained we can increase on Tuesday AM to 15 mg TID  4/22- Increasing Baclofen  to 15 mg TID- for continued spasticity- educated could make more sleepy/more constipated, but just increased Senna, so should help with that.   4/23- pt was tired yesterday AM, but it got better- con't regimen  4/25- spasticity doing somewhat better  I spent a total of 46   minutes on total care today- >50% coordination of care- due to  D/w  pt as well as nursing about his concerns regarding an episode- frozen shoulder; bowels and sensory deficits.   LOS: 10 days A FACE TO FACE EVALUATION WAS PERFORMED  Marily Konczal 04/18/2024, 7:57 AM

## 2024-04-18 NOTE — Progress Notes (Signed)
 Physical Therapy Session Note  Patient Details  Name: Nathaniel Hobbs MRN: [id number] Date of Birth: [birth year]  Today's Date: 04/18/2024 PT Individual Time: 0830-0945 PT Individual Time Calculation (min): 75 min   Short Term Goals: Week 2:  PT Short Term Goal 1 (Week 2): = LTGS  Skilled Therapeutic Interventions/Progress Updates:    Pt reporting to RT and PT about getting himself up this morning and taking himself to the bathroom and getting himself washed up at the sink - reviewed overall safety recommendations, fall risk, and reviewed what went well and what was a challenge. Discussed the footup brace again, as pt asking if he really needs it. Reviewed that yesterday afternoon when we walked without it, his gait was worse and toe drag increased, ankle inversion increased - pt verbalized understanding and with education in agreement. Pt worried about taking on and off and reviewed that the recommendation would be that he puts it on in the morning and leave it on. Also discussed importance of use of RW at this time. Pt verbalized understanding. Had pt practice putting on the brace, but needed assist ultimately. With Rec therapy went outside to practice community reintegration and getting back into his hobbies - performed gait on paved surface, stair negotiation with single rail, gait on grass and mulched surface, and up a hill. Pt overall CGA to close supervision with RW and brace with cues at times for attention to LLE positioning and safety with RW. Total distance > 500'. Pt required seated rest break prior to walking back into the building (another 250') and reviewed d/c planning and home environment. Returned back to room. Ambulated short distance without device and required min assist due to fatigue and decreased overall stability without UE support. Reviewed energy conservation and listening to body to be aware of fatigue and needed rest breaks. Also discussed plan in next sessions to practice floor  transfer and curb step negotiation with RW.  Therapy Documentation Precautions:  Precautions Precautions: Fall, Cervical Required Braces or Orthoses: Other Brace Other Brace: soft collar for comfort Restrictions Weight Bearing Restrictions Per Provider Order: No  Pain: Pain Assessment Pain Scale: 0-10 Pain Score: 0-No pain    Therapy/Group: Individual Therapy/Cotreatment with Rec Therapy for [facility]  [name], PT, DPT, CBIS  04/18/2024, 11:27 AM

## 2024-04-18 NOTE — Progress Notes (Signed)
 Physical Therapy Session Note  Patient Details  Name: Nathaniel Hobbs MRN: [id number] Date of Birth: 1961-03-28  Today's Date: 04/18/2024 PT Individual Time: 1330-1426 PT Individual Time Calculation (min): 56 min   Short Term Goals: Week 2:  PT Short Term Goal 1 (Week 2): = LTGS  Skilled Therapeutic Interventions/Progress Updates:    Pt presents in room in recliner, asleep, awakens slowly but agreeable to PT. Pt reporting pain in shoulders throughout session, RN notified and provides medication. Session focused on NMR for BUE/BLE muscle fiber recruitment, BLE ROM and breaking extensor tone as well as gait training for gait mechanics and training curb step. Pt completes transfer with CGA throughout session from various surfaces, ambulatory transfer to [facility] with CGA.  Pt transported to main gym, completes gait training 190' with RW cues for positioning and heel strike with LLE. Pt then completes up/down 5" curb step with RW, visual demonstration with pt able to complete x2 trials with CGA.  Pt then completes NMR for part practice for aspects of completing floor transfer to improve UE strengthening, BLE ROM and breaking of extensor tone for achieving quadruped position including: - wall push ups x10 - pushs in modified plantigrade (BUEs on bench placed on elevated mat) x10 BLE - knee taps on mat in modified plantigrade x5 BLE - bringing BLEs into tall kneeling x5 BLE with BUEs on bench  Pt returns to room and completes ambulatory transfer back to recliner remains seated in recliner with all needs within reach, cal light in place at end of session.    Therapy Documentation Precautions:  Precautions Precautions: Fall, Cervical Required Braces or Orthoses: Other Brace Other Brace: soft collar for comfort Restrictions Weight Bearing Restrictions Per Provider Order: No  Therapy/Group: Individual Therapy  Annia Kilts PT, DPT 04/18/2024, 2:28 PM

## 2024-04-19 DIAGNOSIS — G8250 Quadriplegia, unspecified: Secondary | ICD-10-CM

## 2024-04-19 MED ORDER — POLYETHYLENE GLYCOL 3350 17 G PO PACK
17.0000 g | PACK | Freq: Every day | ORAL | Status: DC
  Administered 2024-04-20 – 2024-04-22 (×3): 17 g via ORAL
  Filled 2024-04-19 (×4): qty 1

## 2024-04-19 NOTE — Progress Notes (Signed)
 Physical Therapy Session Note  Patient Details  Name: Nathaniel Hobbs MRN: [id number] Date of Birth: Oct 20, 1961  Today's Date: 04/19/2024 PT Individual Time: 1350-1510 PT Individual Time Calculation (min): 80 min   Short Term Goals: Week 2:  PT Short Term Goal 1 (Week 2): = LTGS  Skilled Therapeutic Interventions/Progress Updates: Pt presented in recliner agreeable to therapy. Pt states had some increased pain in head (felt more muscular not HA), however recently resolved. Session focused on NMR, gait in community setting and actual car transfer. PTA donned foot up brace total A and assisted with donning shoes minA for time management. Pt stood with supervision and ambulated with CGA/intermittent supervision to main gym. Participated in gait/balance challenge stepping over x 5 hurdles spaced ~46ft apart. On first bout pt used RW and demonstrated good foot clearance and safety, performed with CGA. On second bout pt ambulated without AD for increased balance challenge with pt requiring HHA and intermittent minA due to increased lateral lean. Pt then transported down to [facility] entrance as awaiting for wife to perform actual vehicle transfer. Pt ambulated over bricks with light CGA and descended/ascended x 12 steps using R rail. Pt completed steps with step to pattern and was able to ascend leading with LLE for increased ms recruitment. Pt's wife arrived after stairs and pt ambulated to SUV. Pt and PTA noting significant height and although attempted with use of runner deemed not safe. Pt indicated has footstool at home. PTA was able to return to unit and although unable to find footstool was able to bring down step to simulate. Pt attempted x 2 methods of entering SUV and was successful with both attempts with CGA and increased time. PTA and pt agreed safety method was to step up backwards to seat. Agreed with perform this method upon d/c. Pt transported back to room at end of session and completed ambulatory  transfer without AD and light minA to recliner. Pt left in recliner at end of session with call bell within reach and needs met.      Therapy Documentation Precautions:  Precautions Precautions: Fall, Cervical Required Braces or Orthoses: Other Brace Other Brace: soft collar for comfort Restrictions Weight Bearing Restrictions Per Provider Order: No General:   Vital Signs:   Pain: Pain Assessment Pain Scale: 0-10 Pain Score: 0-No pain    Therapy/Group: Individual Therapy  Chenae Brager 04/19/2024, 4:25 PM

## 2024-04-19 NOTE — Progress Notes (Signed)
 PROGRESS NOTE   Subjective/Complaints:  No issues overnight.  Feels like his sensation is slightly better although still an issue with both hands.  He is still dropping objects.  Spasms are fairly well-controlled at this time.  No longer requiring catheterization for bladder.  Constipation improving with laxative management  ROS:  Pt denies SOB, abd pain, CP, N/V/C/D, and vision changes   Bladder urgency- continued + constipation -improved   Objective:   No results found.  Recent Labs    04/17/24 0623  WBC 6.6  HGB 12.9*  HCT 38.0*  PLT 231    Recent Labs    04/17/24 0623  NA 140  K 3.8  CL 107  CO2 25  GLUCOSE 105*  BUN 8  CREATININE 0.73  CALCIUM  8.7*     Intake/Output Summary (Last 24 hours) at 04/19/2024 1214 Last data filed at 04/18/2024 1858 Gross per 24 hour  Intake 600 ml  Output --  Net 600 ml         Physical Exam: Vital Signs Blood pressure (!) 171/86, pulse 74, temperature 97.6 F (36.4 C), resp. rate 16, height 5\' 9"  (1.753 m), weight 98 kg, SpO2 97%.   General: No acute distress Mood and affect are appropriate Heart: Regular rate and rhythm no rubs murmurs or extra sounds Lungs: Clear to auscultation, breathing unlabored, no rales or wheezes Abdomen: Positive bowel sounds, soft nontender to palpation, nondistended Extremities: No clubbing, cyanosis, or edema   Very little sensation in hands B/L LUE MAS of 2 today- about the same today- no change- RUE 1  MAS- LE's 2- before AM meds- same Extremities: 1+ edema LUE and no LE edema this AM Psych: Pt's affect is appropriate. Pt is cooperative Skin: Surgical incision CDI Neuro:     Mental Status: AAOx3,  no apparent cognitive deficits noted CRANIAL NERVES: 2-12 grossly intact     MOTOR: No change 04/19/24 RUE: 4/5 Deltoid, 4/5 Biceps, 4/5 Triceps, 4-/5 Grip LUE: 3/5 Deltoid, 4-/5 Biceps, 4-/5 Triceps, 3/5 Grip RLE: HF 4-/5,  KE 4/5, ADF 4-/5, APF 4-/5 LLE: HF 3/5, KE 4-/5, ADF 4-/5, APF 4-/5  SENSORY: Intact but altered light touch all 4 extremities   MSK: No joint tenderness/swelling noted.    Assessment/Plan: 1. Functional deficits which require 3+ hours per day of interdisciplinary therapy in a comprehensive inpatient rehab setting. Physiatrist is providing close team supervision and 24 hour management of active medical problems listed below. Physiatrist and rehab team continue to assess barriers to discharge/monitor patient progress toward functional and medical goals  Care Tool:  Bathing              Bathing assist Assist Level: Supervision/Verbal cueing     Upper Body Dressing/Undressing Upper body dressing   What is the patient wearing?: Pull over shirt    Upper body assist Assist Level: Supervision/Verbal cueing    Lower Body Dressing/Undressing Lower body dressing      What is the patient wearing?: Pants     Lower body assist Assist for lower body dressing: Contact Guard/Touching assist     Toileting Toileting    Toileting assist Assist for toileting: Contact Guard/Touching assist  Transfers Chair/bed transfer  Transfers assist     Chair/bed transfer assist level: Supervision/Verbal cueing     Locomotion Ambulation   Ambulation assist      Assist level: Contact Guard/Touching assist Assistive device: Walker-rolling Max distance: 500'   Walk 10 feet activity   Assist     Assist level: Supervision/Verbal cueing Assistive device: Walker-rolling, Orthosis   Walk 50 feet activity   Assist    Assist level: Supervision/Verbal cueing Assistive device: Walker-rolling, Orthosis    Walk 150 feet activity   Assist Walk 150 feet activity did not occur: Safety/medical concerns  Assist level: Supervision/Verbal cueing Assistive device: Walker-rolling, Orthosis    Walk 10 feet on uneven surface  activity   Assist     Assist level: Contact  Guard/Touching assist Assistive device: Walker-rolling, Orthosis   Wheelchair     Assist Is the patient using a wheelchair?: Yes (Per PT documentation) Type of Wheelchair: Manual    Wheelchair assist level: Dependent - Patient 0%      Wheelchair 50 feet with 2 turns activity    Assist        Assist Level: Dependent - Patient 0%   Wheelchair 150 feet activity     Assist      Assist Level: Dependent - Patient 0%   Blood pressure (!) 171/86, pulse 74, temperature 97.6 F (36.4 C), resp. rate 16, height 5\' 9"  (1.753 m), weight 98 kg, SpO2 97%.   Medical Problem List and Plan: 1. Functional deficits secondary to cervical stenosis/myelopathy status post ACDF C3-5 04/03/2024             -patient may shower, cover incision please             -ELOS/Goals: 14-16 days, PT/OT supervision/Mod I            D/c4/30  Con't CIR PT and OT               -PFRAOs b/l 2.  Antithrombotics: -DVT/anticoagulation:  Pharmaceutical: Lovenox .  Check vascular             -antiplatelet therapy: Aspirin  81 mg daily 3. Pain Management: Neurontin  400 mg daily, Robaxin  500 mg every 6 hours as needed, oxycodone   as needed  4/17therapy reporting patient has been having some left knee pain from chronic arthritis, will start Voltaren   Order K-pad  4/18 neck pain and knee pain much improved today, continue current regimen  4/20- Reports neck pain is tolerable-   4/22- Neck pain/overall pain doing better, except for spasticity pain  4/23- taking pain meds q4 hours around the clock per nursing- will d/w pt when it's possible to try and reduce dosing  4/24- will change Oxy to Norco  and will take tylenol  when can go without Norco  4/25- doing OK- pain doing better except neck sore from bed- will try Lidoderm  patches 8pm to 8am 4. Mood/Behavior/Sleep: Provide emotional support             -antipsychotic agents: N/A 5. Neuropsych/cognition: This patient is capable of making decisions on his own  behalf. 6. Skin/Wound Care: Routine skin checks 7. Fluids/Electrolytes/Nutrition: Routine in and outs with follow-up chemistries 8.  Constipation vs Neurogenic bowel              - Scheduled MiraLAX  twice daily and continue Senokot.  Consider suppository bowel program if not having regular Bms  -Pt doesn't want to try suppository, will do sorbitol  60ml for now  -4/17 patient had very large  bowel movement yesterday, continent.   Continue MiraLAX  and Senokot and monitor. -4/18 LBM was after sorbitol - continent but was was liquid after this medication, will check abdominal xray to assess stool burden.  Will order dulcolax capsule today, pt reports this has worked will for him in the past. 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 Continue suppository at night PRN for now pt resistant to trying. 4/19- had blow out last night- however I discussed with pt about neurogenic bowel as well as constipation from baclofen  playing a role- -will increase Senna to 3 tabs/day since increasing baclofen  4/20- Feels like could have BM this AM- but if doesn't was hoping for another Dulcolax PO- explained increased his Senna, but if needs additional meds, let nursing know 4/21 Reports good BM yesterday, continue current regimen  4/22- had a "blow out" that barely got to bathroom for last night- but we discussed if we go up on Baclofen , could balance these issues out. Wait to reduce Senna 4/23- LBM 2 days ago- denies constipation- wants ot wait for intervention 4/24- will decrease Senna to 2 tabs/day 4/25- LBM yesterday- was more formed 4/26 add miralax  daily 9.  Hypertension.  Vasotec  10 mg twice daily.  Monitor with increased mobility             -4/17-19 controlled overall continue to monitor  4/21-4/22 overall controlled, continue current regimen   4/23- BP overall controlled- a little elevated this AM- also bradycardic this AM- will monitor Sx's and vitals  4/24- BP very slightly elevated this AM- otherwise  controlled- con't to monitor  4/25- BP 141/80 this AM- til mildly bradycardic- con't to monitor and med regimen    04/19/2024    5:09 AM 04/18/2024    7:27 PM 04/18/2024    1:15 PM  Vitals with BMI  Systolic 171 118 604  Diastolic 86 73 55  Pulse 74 51 57    10.  CAD status post PCI 2000 08/2012.  Follow-up with cardiology services Dr. Burney Carter. Denies CP 11.  Hyperlipidemia.  Lipitor  12.  Obesity.  BMI 34.18.  Dietary follow-up  4/19- BMI down to 31.91 13. Possible Neurogenic bladder             -PVR/bladder scans IC if greater than 350  - 4/16 Will ask nursing check bladder scan/PVR  -4/17 patient did have a bladder scan yesterday to 12, will asked nursing to try to check PVRs  -4/18 patient had 3 PVRs all under 100, and continent  4/19- will d/c bladder scans  4/21 remains continent   4/23- Having bladder urgency- will check CBC with diff in AM and if WBC elevated, will check U/A- however is common with SCI patients-   4/24- WBC 6.6k 14) Spasticity - Will start baclofen  5 mg 3 times daily -4/16-8 improved continue current regimen and monitor 4/19- Spasticity sounds like it's progressing- will increase Baclofen  to 10 mg TID  4/20- pt reports is a little better, however really wants to increase as soon as possible- I explained we can increase on Tuesday AM to 15 mg TID  4/22- Increasing Baclofen  to 15 mg TID- for continued spasticity- educated could make more sleepy/more constipated, but just increased Senna, so should help with that.   4/23- pt was tired yesterday AM, but it got better- con't regimen  4/25- spasticity doing somewhat better   LOS: 11 days A FACE TO FACE EVALUATION WAS PERFORMED  Genetta Kenning 04/19/2024, 12:14 PM

## 2024-04-19 NOTE — Plan of Care (Signed)
  Problem: Consults Goal: RH SPINAL CORD INJURY PATIENT EDUCATION Description:  See Patient Education module for education specifics.  Outcome: Progressing   Problem: RH SAFETY Goal: RH STG ADHERE TO SAFETY PRECAUTIONS W/ASSISTANCE/DEVICE Description: STG Adhere to Safety Precautions With  cues Assistance/Device. Outcome: Progressing   Problem: RH PAIN MANAGEMENT Goal: RH STG PAIN MANAGED AT OR BELOW PT'S PAIN GOAL Description: Manage pain < 4 with prns Outcome: Progressing

## 2024-04-20 NOTE — Progress Notes (Signed)
 PROGRESS NOTE   Subjective/Complaints:  No issues except poor sleep due to staff awakening , mild Left sided neck pain   ROS:  Pt denies SOB, abd pain, CP, N/V/C/D, and vision changes   Bladder urgency- continued + constipation -improved   Objective:   No results found.  No results for input(s): "WBC", "HGB", "HCT", "PLT" in the last 72 hours.   No results for input(s): "NA", "K", "CL", "CO2", "GLUCOSE", "BUN", "CREATININE", "CALCIUM " in the last 72 hours.    Intake/Output Summary (Last 24 hours) at 04/20/2024 1011 Last data filed at 04/20/2024 0814 Gross per 24 hour  Intake 460 ml  Output --  Net 460 ml         Physical Exam: Vital Signs Blood pressure (!) 157/84, pulse (!) 52, temperature 98.4 F (36.9 C), temperature source Oral, resp. rate 18, height 5\' 9"  (1.753 m), weight 98 kg, SpO2 98%.   General: No acute distress Mood and affect are appropriate Heart: Regular rate and rhythm no rubs murmurs or extra sounds Lungs: Clear to auscultation, breathing unlabored, no rales or wheezes Abdomen: Positive bowel sounds, soft nontender to palpation, nondistended Extremities: No clubbing, cyanosis, or edema   Very little sensation in hands B/L LUE MAS of 2 today- about the same today- no change- RUE 1  MAS- LE's 2- before AM meds- same Extremities: 1+ edema LUE and no LE edema this AM Psych: Pt's affect is appropriate. Pt is cooperative Skin: Surgical incision CDI Neuro:     Mental Status: AAOx3,  no apparent cognitive deficits noted CRANIAL NERVES: 2-12 grossly intact     MOTOR: No change 04/19/24 RUE: 4/5 Deltoid, 4/5 Biceps, 4/5 Triceps, 4-/5 Grip LUE: 4-/5 Deltoid, 4-/5 Biceps, 4-/5 Triceps, 4-/5 Grip RLE: HF 4-/5, KE 4/5, ADF 4-/5, APF 4-/5 LLE: HF 3/5, KE 4-/5, ADF 4-/5, APF 4-/5  SENSORY: Intact but altered light touch all 4 extremities   MSK: No joint tenderness/swelling noted.     Assessment/Plan: 1. Functional deficits which require 3+ hours per day of interdisciplinary therapy in a comprehensive inpatient rehab setting. Physiatrist is providing close team supervision and 24 hour management of active medical problems listed below. Physiatrist and rehab team continue to assess barriers to discharge/monitor patient progress toward functional and medical goals  Care Tool:  Bathing              Bathing assist Assist Level: Supervision/Verbal cueing     Upper Body Dressing/Undressing Upper body dressing   What is the patient wearing?: Pull over shirt    Upper body assist Assist Level: Supervision/Verbal cueing    Lower Body Dressing/Undressing Lower body dressing      What is the patient wearing?: Pants     Lower body assist Assist for lower body dressing: Contact Guard/Touching assist     Toileting Toileting    Toileting assist Assist for toileting: Contact Guard/Touching assist     Transfers Chair/bed transfer  Transfers assist     Chair/bed transfer assist level: Supervision/Verbal cueing     Locomotion Ambulation   Ambulation assist      Assist level: Contact Guard/Touching assist Assistive device: Walker-rolling Max distance: 500'   Walk  10 feet activity   Assist     Assist level: Supervision/Verbal cueing Assistive device: Walker-rolling, Orthosis   Walk 50 feet activity   Assist    Assist level: Supervision/Verbal cueing Assistive device: Walker-rolling, Orthosis    Walk 150 feet activity   Assist Walk 150 feet activity did not occur: Safety/medical concerns  Assist level: Supervision/Verbal cueing Assistive device: Walker-rolling, Orthosis    Walk 10 feet on uneven surface  activity   Assist     Assist level: Contact Guard/Touching assist Assistive device: Walker-rolling, Orthosis   Wheelchair     Assist Is the patient using a wheelchair?: Yes (Per PT documentation) Type of  Wheelchair: Manual    Wheelchair assist level: Dependent - Patient 0%      Wheelchair 50 feet with 2 turns activity    Assist        Assist Level: Dependent - Patient 0%   Wheelchair 150 feet activity     Assist      Assist Level: Dependent - Patient 0%   Blood pressure (!) 157/84, pulse (!) 52, temperature 98.4 F (36.9 C), temperature source Oral, resp. rate 18, height 5\' 9"  (1.753 m), weight 98 kg, SpO2 98%.   Medical Problem List and Plan: 1. Functional deficits secondary to cervical stenosis/myelopathy status post ACDF C3-5 04/03/2024             -patient may shower, cover incision please             -ELOS/Goals: 14-16 days, PT/OT supervision/Mod I            D/c4/30  Con't CIR PT and OT               -PFRAOs b/l 2.  Antithrombotics: -DVT/anticoagulation:  Pharmaceutical: Lovenox .  Check vascular             -antiplatelet therapy: Aspirin  81 mg daily 3. Pain Management: Neurontin  400 mg daily, Robaxin  500 mg every 6 hours as needed, oxycodone   as needed  4/17therapy reporting patient has been having some left knee pain from chronic arthritis, will start Voltaren   Order K-pad  4/18 neck pain and knee pain much improved today, continue current regimen  4/20- Reports neck pain is tolerable-   4/22- Neck pain/overall pain doing better, except for spasticity pain  4/23- taking pain meds q4 hours around the clock per nursing- will d/w pt when it's possible to try and reduce dosing  4/24- will change Oxy to Norco  and will take tylenol  when can go without Norco  4/25- doing OK- pain doing better except neck sore from bed- will try Lidoderm  patches 8pm to 8am 4. Mood/Behavior/Sleep: Provide emotional support             -antipsychotic agents: N/A 5. Neuropsych/cognition: This patient is capable of making decisions on his own behalf. 6. Skin/Wound Care: Routine skin checks 7. Fluids/Electrolytes/Nutrition: Routine in and outs with follow-up chemistries 8.   Constipation vs Neurogenic bowel              - Scheduled MiraLAX  twice daily and continue Senokot.  Consider suppository bowel program if not having regular Bms  -Pt doesn't want to try suppository, will do sorbitol  60ml for now  -4/17 patient had very large bowel movement yesterday, continent.   Continue MiraLAX  and Senokot and monitor. -4/18 LBM was after sorbitol - continent but was was liquid after this medication, will check abdominal xray to assess stool burden.  Will order dulcolax capsule today, pt reports  this has worked will for him in the past. 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 Continue suppository at night PRN for now pt resistant to trying. 4/19- had blow out last night- however I discussed with pt about neurogenic bowel as well as constipation from baclofen  playing a role- -will increase Senna to 3 tabs/day since increasing baclofen  4/20- Feels like could have BM this AM- but if doesn't was hoping for another Dulcolax PO- explained increased his Senna, but if needs additional meds, let nursing know 4/21 Reports good BM yesterday, continue current regimen  4/22- had a "blow out" that barely got to bathroom for last night- but we discussed if we go up on Baclofen , could balance these issues out. Wait to reduce Senna 4/23- LBM 2 days ago- denies constipation- wants ot wait for intervention 4/24- will decrease Senna to 2 tabs/day 4/25- LBM yesterday- was more formed 4/26 add miralax  daily 9.  Hypertension.  Vasotec  10 mg twice daily.  Monitor with increased mobility             -4/17-19 controlled overall continue to monitor  4/21-4/22 overall controlled, continue current regimen   4/23- BP overall controlled- a little elevated this AM- also bradycardic this AM- will monitor Sx's and vitals  4/24- BP very slightly elevated this AM- otherwise controlled- con't to monitor  4/25- BP 141/80 this AM- til mildly bradycardic- con't to monitor and med regimen    04/20/2024    5:32 AM  04/19/2024    7:19 PM 04/19/2024    5:09 AM  Vitals with BMI  Systolic 157 131 027  Diastolic 84 73 86  Pulse 52 54 74    10.  CAD status post PCI 2000 08/2012.  Follow-up with cardiology services Dr. Burney Carter. Denies CP 11.  Hyperlipidemia.  Lipitor  12.  Obesity.  BMI 34.18.  Dietary follow-up  4/19- BMI down to 31.91 13. Possible Neurogenic bladder             -PVR/bladder scans IC if greater than 350  - 4/16 Will ask nursing check bladder scan/PVR  -4/17 patient did have a bladder scan yesterday to 12, will asked nursing to try to check PVRs  -4/18 patient had 3 PVRs all under 100, and continent  4/19- will d/c bladder scans  4/21 remains continent   4/23- Having bladder urgency- will check CBC with diff in AM and if WBC elevated, will check U/A- however is common with SCI patients-   4/24- WBC 6.6k 14) Spasticity - Will start baclofen  5 mg 3 times daily -4/16-8 improved continue current regimen and monitor 4/19- Spasticity sounds like it's progressing- will increase Baclofen  to 10 mg TID  4/20- pt reports is a little better, however really wants to increase as soon as possible- I explained we can increase on Tuesday AM to 15 mg TID  4/22- Increasing Baclofen  to 15 mg TID- for continued spasticity- educated could make more sleepy/more constipated, but just increased Senna, so should help with that.   4/23- pt was tired yesterday AM, but it got better- con't regimen  4/25- spasticity doing somewhat better   LOS: 12 days A FACE TO FACE EVALUATION WAS PERFORMED  Genetta Kenning 04/20/2024, 10:11 AM

## 2024-04-20 NOTE — Plan of Care (Signed)
  Problem: Consults Goal: RH SPINAL CORD INJURY PATIENT EDUCATION Description:  See Patient Education module for education specifics.  Outcome: Progressing   Problem: RH PAIN MANAGEMENT Goal: RH STG PAIN MANAGED AT OR BELOW PT'S PAIN GOAL Description: Manage pain < 4 with prns Outcome: Progressing   Problem: RH KNOWLEDGE DEFICIT SCI Goal: RH STG INCREASE KNOWLEDGE OF SELF CARE AFTER SCI Description: Patient and Spouse will be able to manage care at discharge using educational resources independently Outcome: Progressing   Problem: Consults Goal: RH SPINAL CORD INJURY PATIENT EDUCATION Description:  See Patient Education module for education specifics.  Outcome: Progressing   Problem: RH PAIN MANAGEMENT Goal: RH STG PAIN MANAGED AT OR BELOW PT'S PAIN GOAL Description: Manage pain < 4 with prns Outcome: Progressing   Problem: RH KNOWLEDGE DEFICIT SCI Goal: RH STG INCREASE KNOWLEDGE OF SELF CARE AFTER SCI Description: Patient and Spouse will be able to manage care at discharge using educational resources independently Outcome: Progressing   Problem: RH SAFETY Goal: RH STG ADHERE TO SAFETY PRECAUTIONS W/ASSISTANCE/DEVICE Description: STG Adhere to Safety Precautions With  cues Assistance/Device. Outcome: Adequate for Discharge

## 2024-04-20 NOTE — Progress Notes (Signed)
 Pt offered Voltaren  gel, pt has refused and states they do not want to take it anymore

## 2024-04-20 NOTE — Progress Notes (Signed)
 Occupational Therapy Session Note  Patient Details  Name: Sinclair Schipp MRN: [id number] Date of Birth: 1961/07/09  Today's Date: 04/20/2024 OT Individual Time: 1345-1445 OT Individual Time Calculation (min): 60 min    Short Term Goals: Week 2:  OT Short Term Goal 1 (Week 2): STG=LTG d/t ELOS  Skilled Therapeutic Interventions/Progress Updates:      Therapy Documentation Precautions:  Precautions Precautions: Fall, Cervical Required Braces or Orthoses: Other Brace Other Brace: soft collar for comfort Restrictions Weight Bearing Restrictions Per Provider Order: No General: "Thank you!" Pt seated in recliner upon OT arrival, agreeable to OT.  Pain: no pain reported  ADL: OT providing skilled intervention on ADL retraining and activity tolerance. Pt completed all ADL tasks and functional mobility at SBA with RW. OT noting increased FMC of BUE, able to grip and pull down shirt with increased independence. Pt reporting wanting to work on functional mobility and endurance. OT providing skilled intervention with functional mobility. Pt able to ambulate with RW around therapy unit on even terrain, around obstacles and complete multiple sit to stand transfers from various surface levels at SBA with RW. Pt with no LOB/SOB, able to multi-task with conversing and ambulating simultaneously with distractions of others provided in order to increase awareness for D/C preparation.    Pt seated in recliner at end of session with W/C alarm donned, call light within reach and 4Ps assessed.    Therapy/Group: Individual Therapy  Nila Barth, OTD, OTR/L 04/20/2024, 4:16 PM

## 2024-04-21 MED ORDER — APIXABAN 2.5 MG PO TABS
2.5000 mg | ORAL_TABLET | Freq: Two times a day (BID) | ORAL | Status: DC
  Administered 2024-04-21 – 2024-04-23 (×5): 2.5 mg via ORAL
  Filled 2024-04-21 (×5): qty 1

## 2024-04-21 NOTE — Progress Notes (Signed)
 Patient ID: Nathaniel Hobbs, male   DOB: 1961-11-28, 63 y.o.   MRN: [id number] Met with pt to let know have found [facility] is probably the place he went to last time and have made a referral to them for OPPT and OT. Preparing for discharge this week. Have let MD know regarding P2P and time frame involved.

## 2024-04-21 NOTE — Group Note (Signed)
 Patient Details Name: Nathaniel Hobbs MRN: [id number] DOB: 1961/04/08 Today's Date: 04/21/2024  Group Description: GOAL:  Pt will actively participate in >45 mintue ortho UEG group seated with supervision.  MET  Pt participated in group session focused on UE strength and endurance.  Pt smiling, laughing and easily engaged in conversation/encouragement with other group participants.  Utilized UE Dice game, ball toss activities and Ther[employer] home exercise progam (HEP provided by [employer]) during this group.  Pt participated at supervision(instructional/demonstration cues)  w/c level. Pt initiated rest breaks as needed throughout the session. Ended session with guided deep breathing for 3 mins. Discussed benefits of deep breathing to manage stress and pain  Pt returned to the room with staff.  Pain:no c/o    Alejandria Wessells 04/21/2024, 12:22 PM

## 2024-04-21 NOTE — Progress Notes (Signed)
 Physical Therapy Session Note  Patient Details  Name: Nathaniel Hobbs MRN: [id number] Date of Birth: 04-25-1961  Today's Date: 04/21/2024 PT Individual Time: 1301-1405 PT Individual Time Calculation (min): 64 min   Short Term Goals: Week 1:  PT Short Term Goal 1 (Week 1): Pt will transfer sup to sit w/ min A PT Short Term Goal 1 - Progress (Week 1): Met PT Short Term Goal 2 (Week 1): Pt will transfer sit to stand w/ CGA PT Short Term Goal 2 - Progress (Week 1): Met PT Short Term Goal 3 (Week 1): Pt will amb w/ RW x 100 and C/S PT Short Term Goal 3 - Progress (Week 1): Met PT Short Term Goal 4 (Week 1): Pt will negotiate stairs w/ 1 rail and min A. PT Short Term Goal 4 - Progress (Week 1): Met Week 2:  PT Short Term Goal 1 (Week 2): = LTGS Week 3:     Skilled Therapeutic Interventions/Progress Updates:  Patient *** on entrance to room. Patient alert and agreeable to PT session.   Patient with no pain complaint at start of session.  Therapeutic Activity: Bed Mobility: Pt performed supine <> sit with ***. VC/ tc required for ***. Transfers: Pt performed sit<>stand and stand pivot transfers throughout session with ***. Provided vc/ tc for***.  Gait Training:  Pt ambulated *** ft using *** with ***. Demonstrated ***. Provided vc/ tc for ***.  Wheelchair Mobility:  Pt propelled wheelchair *** feet with ***. Provided vc/ tc for ***.  Neuromuscular Re-ed: NMR facilitated during session with focus on ***. Pt guided in ***. NMR performed for improvements in motor control and coordination, balance, sequencing, judgement, and self confidence/ efficacy in performing all aspects of mobility at highest level of independence.   Therapeutic Exercise: Pt performed the following exercises with vc/ tc for proper technique. ***  Patient *** at end of session with brakes locked, *** alarm set, and all needs within reach.  Access Code: [email or web address] URL: [email or web address] Date:  04/21/2024 Prepared by: Correne Dillon  Exercises - Mini Squat with Counter Support  - 2 x daily - 7 x weekly - 2 sets - 10 reps - Standing Hip Abduction with Counter Support  - 2 x daily - 7 x weekly - 2 sets - 10 reps - Heel Raises with Counter Support  - 2 x daily - 7 x weekly - 2 sets - 10 reps - Standing Hip Extension with Counter Support  - 2 x daily - 7 x weekly - 2 sets - 10 reps - Standing March with Counter Support  - 2 x daily - 7 x weekly - 2 sets - 10 reps - Step Up  - 2 x daily - 7 x weekly - 2 sets - 10 reps - Standing Knee Flexion with Counter Support  - 2 x daily - 7 x weekly - 2 sets - 10 reps  Therapy Documentation Precautions:  Precautions Precautions: Fall, Cervical Required Braces or Orthoses: Other Brace Other Brace: soft collar for comfort Restrictions Weight Bearing Restrictions Per Provider Order: No Pain:     Therapy/Group: {Therapy/Group:[id number]}  [name] 04/21/2024, 4:40 PM

## 2024-04-21 NOTE — Progress Notes (Signed)
 Occupational Therapy Discharge Summary  Patient Details  Name: Nathaniel Hobbs MRN: [id number] Date of Birth: December 15, 1961  Date of Discharge from OT service:April 22, 2024  {CHL IP REHAB OT TIME CALCULATIONS:304400400}   Patient has met {NUMBERS 0-12:18577} of {NUMBERS 0-12:18577} long term goals due to improved activity tolerance, improved balance, postural control, ability to compensate for deficits, functional use of  RIGHT upper, RIGHT lower, LEFT upper, and LEFT lower extremity, and improved coordination.  Patient to discharge at overall Modified Independent level.  Patient's care partner is independent to provide the necessary  intermittent supervision  assistance at discharge.    Reasons goals not met: All goals met  Recommendation:  Patient will benefit from ongoing skilled OT services in outpatient setting to continue to advance functional skills in the area of BADL, iADL, Vocation, and Reduce care partner burden.  Equipment: OT recommending TTB for bathing  Reasons for discharge: treatment goals met and discharge from hospital  Patient/family agrees with progress made and goals achieved: Yes  OT Discharge Precautions/Restrictions    General OT Amount of Missed Time: 15 Minutes Vital Signs Therapy Vitals Temp: 97.9 F (36.6 C) Pulse Rate: (!) 53 Resp: 18 BP: 112/70 Patient Position (if appropriate): Sitting Oxygen Therapy SpO2: 98 % O2 Device: Room Air Pain   ADL ADL Eating: Set up Grooming: Supervision/safety Where Assessed-Grooming: Sitting at sink Upper Body Bathing: Minimal assistance Where Assessed-Upper Body Bathing: Shower Lower Body Bathing: Minimal assistance Where Assessed-Lower Body Bathing: Shower Upper Body Dressing: Minimal assistance Where Assessed-Upper Body Dressing: Chair Lower Body Dressing: Maximal assistance Where Assessed-Lower Body Dressing: Chair Toileting: Moderate assistance Where Assessed-Toileting: Toilet, Bedside  Commode Toilet Transfer: Minimal Dentist Method: Proofreader: Grab bars, Animator Transfer: Not assessed Film/video editor: Minimal assistance Film/video editor Method: Designer, industrial/product: Emergency planning/management officer, Grab bars ADL Comments: Issued long handle sponge and introduced reacher for LB dressing. Pt taking PRN rest breaks during ADL tasks during session. Pt using RW during all functional mobility throughout room with no LOB/SOB or buckling noted. Vision   Perception    Praxis   Cognition   Sensation   Motor    Mobility     Trunk/Postural Assessment     Balance   Extremity/Trunk Assessment       Nila Barth, OTD, OTR/L 04/21/2024, 4:29 PM

## 2024-04-21 NOTE — Progress Notes (Signed)
 PROGRESS NOTE   Subjective/Complaints:   Been refusing lovenox - because feels like has it's been hurting too much- the injection willing to do Eliquis.   LBM Saturday- they switched him to Miralax  over weekend and no issues so far.   Hasn't used Lidoderm  patches due to lack of pain in neck but still hates the bed.     ROS:    Pt denies SOB, abd pain, CP, N/V/C/D, and vision changes    Bladder urgency- continued + constipation -improved   Objective:   No results found.  No results for input(s): "WBC", "HGB", "HCT", "PLT" in the last 72 hours.   No results for input(s): "NA", "K", "CL", "CO2", "GLUCOSE", "BUN", "CREATININE", "CALCIUM " in the last 72 hours.    Intake/Output Summary (Last 24 hours) at 04/21/2024 0932 Last data filed at 04/21/2024 0755 Gross per 24 hour  Intake 540 ml  Output --  Net 540 ml         Physical Exam: Vital Signs Blood pressure (!) 144/74, pulse (!) 54, temperature 98.2 F (36.8 C), resp. rate 18, height 5\' 9"  (1.753 m), weight 98 kg, SpO2 98%.     General: awake, alert, appropriate, pt supine in bed; LPN in room; NAD HENT: conjugate gaze; oropharynx moist CV: regular  rhythm, bradycardic rate; no JVD Pulmonary: CTA B/L; no W/R/R- good air movement- slightly decreased at bases GI: soft, NT, ND, (+)BS- normoactive Psychiatric: appropriate- frustrated from some things this weekend Neurological: Ox3  Very little sensation in hands B/L LUE MAS of 2 today- about the same today- no change- RUE 1  MAS- LE's 2- before AM meds- same Extremities: 1+ edema LUE and no LE edema this AM Psych: Pt's affect is appropriate. Pt is cooperative Skin: Surgical incision CDI Neuro:     Mental Status: AAOx3,  no apparent cognitive deficits noted CRANIAL NERVES: 2-12 grossly intact     MOTOR: No change 04/19/24 RUE: 4/5 Deltoid, 4/5 Biceps, 4/5 Triceps, 4-/5 Grip LUE: 4-/5 Deltoid, 4-/5  Biceps, 4-/5 Triceps, 4-/5 Grip RLE: HF 4-/5, KE 4/5, ADF 4-/5, APF 4-/5 LLE: HF 3/5, KE 4-/5, ADF 4-/5, APF 4-/5  SENSORY: Intact but altered light touch all 4 extremities   MSK: No joint tenderness/swelling noted.    Assessment/Plan: 1. Functional deficits which require 3+ hours per day of interdisciplinary therapy in a comprehensive inpatient rehab setting. Physiatrist is providing close team supervision and 24 hour management of active medical problems listed below. Physiatrist and rehab team continue to assess barriers to discharge/monitor patient progress toward functional and medical goals  Care Tool:  Bathing              Bathing assist Assist Level: Supervision/Verbal cueing     Upper Body Dressing/Undressing Upper body dressing   What is the patient wearing?: Pull over shirt    Upper body assist Assist Level: Supervision/Verbal cueing    Lower Body Dressing/Undressing Lower body dressing      What is the patient wearing?: Pants     Lower body assist Assist for lower body dressing: Contact Guard/Touching assist     Toileting Toileting    Toileting assist Assist for toileting: Contact Guard/Touching assist  Transfers Chair/bed transfer  Transfers assist     Chair/bed transfer assist level: Supervision/Verbal cueing     Locomotion Ambulation   Ambulation assist      Assist level: Contact Guard/Touching assist Assistive device: Walker-rolling Max distance: 500'   Walk 10 feet activity   Assist     Assist level: Supervision/Verbal cueing Assistive device: Walker-rolling, Orthosis   Walk 50 feet activity   Assist    Assist level: Supervision/Verbal cueing Assistive device: Walker-rolling, Orthosis    Walk 150 feet activity   Assist Walk 150 feet activity did not occur: Safety/medical concerns  Assist level: Supervision/Verbal cueing Assistive device: Walker-rolling, Orthosis    Walk 10 feet on uneven surface   activity   Assist     Assist level: Contact Guard/Touching assist Assistive device: Walker-rolling, Orthosis   Wheelchair     Assist Is the patient using a wheelchair?: Yes (Per PT documentation) Type of Wheelchair: Manual    Wheelchair assist level: Dependent - Patient 0%      Wheelchair 50 feet with 2 turns activity    Assist        Assist Level: Dependent - Patient 0%   Wheelchair 150 feet activity     Assist      Assist Level: Dependent - Patient 0%   Blood pressure (!) 144/74, pulse (!) 54, temperature 98.2 F (36.8 C), resp. rate 18, height 5\' 9"  (1.753 m), weight 98 kg, SpO2 98%.   Medical Problem List and Plan: 1. Functional deficits secondary to cervical stenosis/myelopathy status post ACDF C3-5 04/03/2024             -patient may shower, cover incision please             -ELOS/Goals: 14-16 days, PT/OT supervision/Mod I            D/c4/30  Con't CIR PT and OT             -PFRAOs b/l 2.  Antithrombotics: -DVT/anticoagulation:  Pharmaceutical: Lovenox .  Check vascular  4/28- will change Lovenox  to Elqiuis- will need a total of 2months from day of surgery             -antiplatelet therapy: Aspirin  81 mg daily 3. Pain Management: Neurontin  400 mg daily, Robaxin  500 mg every 6 hours as needed, oxycodone   as needed  4/17therapy reporting patient has been having some left knee pain from chronic arthritis, will start Voltaren   Order K-pad  4/18 neck pain and knee pain much improved today, continue current regimen  4/20- Reports neck pain is tolerable-   4/22- Neck pain/overall pain doing better, except for spasticity pain  4/23- taking pain meds q4 hours around the clock per nursing- will d/w pt when it's possible to try and reduce dosing  4/24- will change Oxy to Norco  and will take tylenol  when can go without Norco  4/25- doing OK- pain doing better except neck sore from bed- will try Lidoderm  patches 8pm to 8am  4/28- stopped patches since not  using them- last took Norco 4/25 4. Mood/Behavior/Sleep: Provide emotional support             -antipsychotic agents: N/A 5. Neuropsych/cognition: This patient is capable of making decisions on his own behalf. 6. Skin/Wound Care: Routine skin checks 7. Fluids/Electrolytes/Nutrition: Routine in and outs with follow-up chemistries 8.  Constipation vs Neurogenic bowel              - Scheduled MiraLAX  twice daily and continue Senokot.  Consider suppository bowel program if not having regular Bms  -Pt doesn't want to try suppository, will do sorbitol  60ml for now  -4/17 patient had very large bowel movement yesterday, continent.   Continue MiraLAX  and Senokot and monitor. -4/18 LBM was after sorbitol - continent but was was liquid after this medication, will check abdominal xray to assess stool burden.  Will order dulcolax capsule today, pt reports this has worked will for him in the past. 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 Continue suppository at night PRN for now pt resistant to trying. 4/19- had blow out last night- however I discussed with pt about neurogenic bowel as well as constipation from baclofen  playing a role- -will increase Senna to 3 tabs/day since increasing baclofen  4/20- Feels like could have BM this AM- but if doesn't was hoping for another Dulcolax PO- explained increased his Senna, but if needs additional meds, let nursing know 4/21 Reports good BM yesterday, continue current regimen  4/22- had a "blow out" that barely got to bathroom for last night- but we discussed if we go up on Baclofen , could balance these issues out. Wait to reduce Senna 4/23- LBM 2 days ago- denies constipation- wants ot wait for intervention 4/24- will decrease Senna to 2 tabs/day 4/25- LBM yesterday- was more formed 4/26 add miralax  daily 4/28- LBM 2 days ago- miralax  was added to Senna, but hasn't gone yet.  9.  Hypertension.  Vasotec  10 mg twice daily.  Monitor with increased mobility              -4/17-19 controlled overall continue to monitor  4/21-4/22 overall controlled, continue current regimen   4/23- BP overall controlled- a little elevated this AM- also bradycardic this AM- will monitor Sx's and vitals  4/28- BP very slightly elevated just this AM, before meds- rest of day is good- - again bradycardic-     04/21/2024    3:30 AM 04/20/2024    8:13 PM 04/20/2024    1:42 PM  Vitals with BMI  Systolic 144 115 161  Diastolic 74 67 68  Pulse 54 57 50    10.  CAD status post PCI 2000 08/2012.  Follow-up with cardiology services Dr. Burney Carter. Denies CP 11.  Hyperlipidemia.  Lipitor  12.  Obesity.  BMI 34.18.  Dietary follow-up  4/19- BMI down to 31.91 13. Possible Neurogenic bladder             -PVR/bladder scans IC if greater than 350  - 4/16 Will ask nursing check bladder scan/PVR  -4/17 patient did have a bladder scan yesterday to 12, will asked nursing to try to check PVRs  -4/18 patient had 3 PVRs all under 100, and continent  4/19- will d/c bladder scans  4/21 remains continent   4/23- Having bladder urgency- will check CBC with diff in AM and if WBC elevated, will check U/A- however is common with SCI patients-   4/24- WBC 6.6k 14) Spasticity - Will start baclofen  5 mg 3 times daily -4/16-8 improved continue current regimen and monitor 4/19- Spasticity sounds like it's progressing- will increase Baclofen  to 10 mg TID  4/20- pt reports is a little better, however really wants to increase as soon as possible- I explained we can increase on Tuesday AM to 15 mg TID  4/22- Increasing Baclofen  to 15 mg TID- for continued spasticity- educated could make more sleepy/more constipated, but just increased Senna, so should help with that.   4/23- pt was tired yesterday AM, but it  got better- con't regimen  4/25- spasticity doing somewhat better  4/28- interested in increasing Baclofen  to 20 mg TID at d/c.     I spent a total of 37   minutes on total care today- >50%  coordination of care- due to  D/w pt about changing to Eliquis from Lovenox  and d/w pt about spasticity and  neck pain  LOS: 13 days A FACE TO FACE EVALUATION WAS PERFORMED  Denette Hass 04/21/2024, 9:32 AM

## 2024-04-21 NOTE — Progress Notes (Addendum)
 Occupational Therapy Session Note  Patient Details  Name: Nathaniel Hobbs MRN: [id number] Date of Birth: May 22, 1961  Today's Date: 04/21/2024 OT Individual Time: [phone] & [phone] (making up missed minutes) OT Individual Time Calculation (min): 10 min  and Today's Date: 04/21/2024 OT Missed Time: 15 Minutes Missed Time Reason: Other (comment) (OT tardy for tx, will make up missed minutes)- missed minutes made up in second session   Short Term Goals: Week 2:  OT Short Term Goal 1 (Week 2): STG=LTG d/t ELOS  Skilled Therapeutic Interventions/Progress Updates:      Therapy Documentation Precautions:  Precautions Precautions: Fall, Cervical Required Braces or Orthoses: Other Brace Other Brace: soft collar for comfort Restrictions Weight Bearing Restrictions Per Provider Order: No General: "Hi there!" Pt seated in recliner upon OT arrival, agreeable to OT.  Pain: no pain reported  ADL: Pt reporting wanting to complete ADL of shaving before D/C. Pt able to stand unsupported at sink with distant supervision for shaving tasks with no LOB/SOB. Pt able to stand for >20 minutes for task with no buckling/weakness noted. OT with intermittent assistance d/t avoiding incision site. Pt using electric razor for increased safety.   Exercises: Pt issued HEP in order to increase functional strength, endurance and activity tolerance in order to increase independence in ADLs. OT and pt discussed direction/technique of exercises, demonstrating verbal understanding. Exercises listed below -sit to stand with emphasis on wider BOS and using gluteal muscles -seated/standing marches  -forward punches with green theraband   Other Treatments: OT providing handout for energy conservation describing what it is, purpose of conserving energy and specific examples of how to conserve energy during ADL and IADL tasks. Pt appreciative for information with good carryover.    Pt seated in recliner at end of  session with W/C alarm donned, call light within reach and 4Ps assessed.   Session 2 General: "Let's go" Pt seated in recliner upon OT arrival, agreeable to OT.  Pain: no pain reported  ADL:OT provided intervention of donning foot up brace with total A for time constraints.  Balance  Exercises: Pt reporting wanting to work on functional mobility and endurance. OT providing skilled intervention with functional mobility. Pt able to ambulate with RW around indoor area on even terrain, around obstacles and complete multiple sit to stand transfers with emphasis on gait speed and increased Lt foot clearance. Pt with no LOB/SOB, able to multi-task with conversing and ambulating simultaneously with distractions of others provided in order to increase awareness for D/C preparation.   Pt seated in recliner at end of session with W/C alarm donned, call light within reach and 4Ps assessed.    Therapy/Group: Individual Therapy  Nila Barth, OTD, OTR/L 04/21/2024, 12:52 PM

## 2024-04-21 NOTE — Group Note (Signed)
 Patient Details Name: Nathaniel Hobbs MRN: [id number] DOB: Dec 07, 1961 Today's Date: 04/21/2024  Time Calculation: OT Group Time Calculation OT Group Start Time: 1105 OT Group Stop Time: 1205 OT Group Time Calculation (min): 60 min      Group Description: BUE Therex Group: Pt participated in group session with a focus on BUE strength and endurance to facilitate improved activity tolerance and strength for higher level BADLs and functional mobility tasks.    Individual level documentation: Pt engaged in seated therapeutic activity game where pts were instructed to roll large dice to see what number they rolled, once a number was determined each number correlated to an UB exercise and a number of reps, exercises included bicep curls, upright rows, flys, chest presses and punches. Repetitions ranged from 10-20. Pt chose to use 3 lb weights during session. Eduction provided during activity of various modifications for all exercises. Education provided on the importance of deep breathing as well as determining each pts activity tolerance.  Pts were issued HEP with level 2 theraband with all group members guided through BUE HEP with exercises including:  - Seated Shoulder Horizontal Abduction with Resistance  - 1 x daily - 7 x weekly - 3 sets - 10 reps - Seated Shoulder Extension with Self-Anchored Resistance  - 1 x daily - 7 x weekly - 3 sets - 10 reps - Seated Elbow Flexion with Self-Anchored Resistance  - 1 x daily - 7 x weekly - 3 sets - 10 reps - Alternating Punch with Resistance  - 1 x daily - 7 x weekly - 3 sets - 10 reps  Pts also engaged in therapeutic activity of tossing ball around the circle with a focus on muscular endurance, global strength as well as improving motor planning and dual tasking skills as pt were asked questions such as "name a color, what was something good that happened to you today and what is one goal that you have? "   Ended session with guide deep breathing for 3  mins. Discussed benefits of deep breathing to manage stress and pain. Pt transported back to room by RT.  Pain: No pain   Willadean Hark 04/21/2024, 12:49 PM

## 2024-04-21 NOTE — Plan of Care (Signed)
  Problem: Consults Goal: RH SPINAL CORD INJURY PATIENT EDUCATION Description:  See Patient Education module for education specifics.  Outcome: Progressing   Problem: RH SAFETY Goal: RH STG ADHERE TO SAFETY PRECAUTIONS W/ASSISTANCE/DEVICE Description: STG Adhere to Safety Precautions With  cues Assistance/Device. Outcome: Progressing   Problem: RH PAIN MANAGEMENT Goal: RH STG PAIN MANAGED AT OR BELOW PT'S PAIN GOAL Description: Manage pain < 4 with prns Outcome: Progressing   Problem: RH KNOWLEDGE DEFICIT SCI Goal: RH STG INCREASE KNOWLEDGE OF SELF CARE AFTER SCI Description: Patient and Spouse will be able to manage care at discharge using educational resources independently Outcome: Progressing

## 2024-04-22 ENCOUNTER — Other Ambulatory Visit (HOSPITAL_COMMUNITY): Payer: Self-pay

## 2024-04-22 ENCOUNTER — Encounter: Admitting: Neurosurgery

## 2024-04-22 MED ORDER — HYDROCODONE-ACETAMINOPHEN 5-325 MG PO TABS
1.0000 | ORAL_TABLET | ORAL | 0 refills | Status: DC | PRN
  Filled 2024-04-22: qty 30, 5d supply, fill #0

## 2024-04-22 MED ORDER — ENALAPRIL MALEATE 10 MG PO TABS
10.0000 mg | ORAL_TABLET | Freq: Two times a day (BID) | ORAL | 0 refills | Status: AC
  Filled 2024-04-22: qty 53, 27d supply, fill #0

## 2024-04-22 MED ORDER — GABAPENTIN 400 MG PO CAPS
400.0000 mg | ORAL_CAPSULE | Freq: Every day | ORAL | 0 refills | Status: AC
  Filled 2024-04-22: qty 30, 30d supply, fill #0

## 2024-04-22 MED ORDER — POLYETHYLENE GLYCOL 3350 17 G PO PACK: 17.0000 g | PACK | Freq: Every day | ORAL | Status: DC | PRN

## 2024-04-22 MED ORDER — ATORVASTATIN CALCIUM 80 MG PO TABS
80.0000 mg | ORAL_TABLET | Freq: Every day | ORAL | 0 refills | Status: AC
Start: 2024-04-22 — End: ?
  Filled 2024-04-22: qty 30, 30d supply, fill #0

## 2024-04-22 MED ORDER — BACLOFEN 10 MG PO TABS
20.0000 mg | ORAL_TABLET | Freq: Three times a day (TID) | ORAL | Status: DC
  Administered 2024-04-22 – 2024-04-23 (×4): 20 mg via ORAL
  Filled 2024-04-22 (×3): qty 2

## 2024-04-22 MED ORDER — BACLOFEN 10 MG PO TABS
15.0000 mg | ORAL_TABLET | Freq: Three times a day (TID) | ORAL | 0 refills | Status: DC
  Filled 2024-04-22: qty 135, 30d supply, fill #0

## 2024-04-22 MED ORDER — APIXABAN 2.5 MG PO TABS
2.5000 mg | ORAL_TABLET | Freq: Two times a day (BID) | ORAL | 1 refills | Status: DC
  Filled 2024-04-22: qty 60, 30d supply, fill #0

## 2024-04-22 MED ORDER — METHOCARBAMOL 500 MG PO TABS
500.0000 mg | ORAL_TABLET | Freq: Four times a day (QID) | ORAL | 0 refills | Status: AC | PRN
  Filled 2024-04-22: qty 60, 15d supply, fill #0

## 2024-04-22 MED ORDER — DICLOFENAC SODIUM 1 % EX GEL
4.0000 g | Freq: Four times a day (QID) | CUTANEOUS | 0 refills | Status: AC
  Filled 2024-04-22: qty 4, fill #0

## 2024-04-22 NOTE — Plan of Care (Signed)
  Problem: RH Balance Goal: LTG Patient will maintain dynamic standing with ADLs (OT) Description: LTG:  Patient will maintain dynamic standing balance with assist during activities of daily living (OT)  Outcome: Completed/Met Flowsheets (Taken 04/09/2024 1618) LTG: Pt will maintain dynamic standing balance during ADLs with: Independent with assistive device   Problem: RH Grooming Goal: LTG Patient will perform grooming w/assist,cues/equip (OT) Description: LTG: Patient will perform grooming with assist, with/without cues using equipment (OT) Outcome: Completed/Met Flowsheets (Taken 04/09/2024 1618) LTG: Pt will perform grooming with assistance level of: Independent with assistive device    Problem: RH Bathing Goal: LTG Patient will bathe all body parts with assist levels (OT) Description: LTG: Patient will bathe all body parts with assist levels (OT) Outcome: Completed/Met Flowsheets (Taken 04/09/2024 1618) LTG: Pt will perform bathing with assistance level/cueing: Independent with assistive device    Problem: RH Dressing Goal: LTG Patient will perform upper body dressing (OT) Description: LTG Patient will perform upper body dressing with assist, with/without cues (OT). Outcome: Completed/Met Flowsheets (Taken 04/09/2024 1618) LTG: Pt will perform upper body dressing with assistance level of: Independent with assistive device Goal: LTG Patient will perform lower body dressing w/assist (OT) Description: LTG: Patient will perform lower body dressing with assist, with/without cues in positioning using equipment (OT) Outcome: Completed/Met Flowsheets (Taken 04/09/2024 1618) LTG: Pt will perform lower body dressing with assistance level of: Independent with assistive device   Problem: RH Toileting Goal: LTG Patient will perform toileting task (3/3 steps) with assistance level (OT) Description: LTG: Patient will perform toileting task (3/3 steps) with assistance level (OT)  Outcome:  Completed/Met Flowsheets (Taken 04/09/2024 1618) LTG: Pt will perform toileting task (3/3 steps) with assistance level: Independent with assistive device   Problem: RH Toilet Transfers Goal: LTG Patient will perform toilet transfers w/assist (OT) Description: LTG: Patient will perform toilet transfers with assist, with/without cues using equipment (OT) Outcome: Completed/Met Flowsheets (Taken 04/09/2024 1618) LTG: Pt will perform toilet transfers with assistance level of: Independent with assistive device   Problem: RH Tub/Shower Transfers Goal: LTG Patient will perform tub/shower transfers w/assist (OT) Description: LTG: Patient will perform tub/shower transfers with assist, with/without cues using equipment (OT) Outcome: Completed/Met Flowsheets (Taken 04/09/2024 1618) LTG: Pt will perform tub/shower stall transfers with assistance level of: Independent with assistive device   Problem: RH Furniture Transfers Goal: LTG Patient will perform furniture transfers w/assist (OT/PT) Description: LTG: Patient will perform furniture transfers  with assistance (OT/PT). Outcome: Completed/Met Flowsheets (Taken 04/22/2024 1531) LTG: Pt will perform furniture transfers with assist:: Independent with assistive device

## 2024-04-22 NOTE — Progress Notes (Addendum)
 Patient ID: Nathaniel Hobbs, male   DOB: Feb 20, 1961, 63 y.o.   MRN: [id number]  Met with pt to discuss team conference and discharge tomorrow. He feels ready but does need a rolling walker will make referral to [employer] to deliver prior to discharge to room. He has changed his mind about going to [facility] and wants to go closer to [facility] can make referral to [facility] for OPPT. He reports his wife wants him closer to home.  Aware discharge tomorrow has met his mod/I level goals  2:20 PM According to MD pt's P2P was approved and he is covered until discharge tomorrow

## 2024-04-22 NOTE — Progress Notes (Signed)
 Physical Therapy Discharge Summary  Patient Details  Name: Nathaniel Hobbs MRN: [id number] Date of Birth: 26-Aug-1961  Date of Discharge from PT service:April 23, 2024    Patient has met 9 of 10 long term goals due to improved activity tolerance, improved balance, improved postural control, increased strength, ability to compensate for deficits, functional use of  right upper extremity, right lower extremity, left upper extremity, and left lower extremity, and improved coordination.  Patient to discharge at an ambulatory level  Modified independent/supervision with RW and L Foot up brace .   Patient's care partner is independent to provide the necessary  supervision  assistance at discharge.  Reasons goals not met: Did not meet car transfer goal due to needing a step due to tall height of car - completed family training with pt's wife.  Recommendation:  Patient will benefit from ongoing skilled PT services in outpatient setting to continue to advance safe functional mobility, address ongoing impairments in strength, spasiticity/tone, endurance, balance, functional mobility, coordination, and minimize fall risk.  Equipment: RW and L foot up brace  Reasons for discharge: treatment goals met and discharge from hospital  Patient/family agrees with progress made and goals achieved: Yes  PT Discharge Precautions/Restrictions Precautions Precautions: Fall;Cervical Recall of Precautions/Restrictions: Intact Other Brace: soft collar for comfort Restrictions Weight Bearing Restrictions Per Provider Order: No  Pain Interference Pain Interference Pain Effect on Sleep: 3. Frequently Pain Interference with Therapy Activities: 1. Rarely or not at all Pain Interference with Day-to-Day Activities: 1. Rarely or not at all Vision/Perception  Vision - History Ability to See in Adequate Light: 0 Adequate Perception Perception: Within Functional Limits Praxis Praxis: WFL  Cognition Overall  Cognitive Status: Within Functional Limits for tasks assessed Arousal/Alertness: Awake/alert Orientation Level: Oriented X4 Memory: Appears intact Awareness: Appears intact Problem Solving: Appears intact Safety/Judgment: Appears intact Demonstrates some decreased insight into overall mobility deficits in bigger picture (reviewing recommendations for use of brace and RW for mobility at this time) Sensation Sensation Light Touch: Appears Intact Proprioception: Impaired Detail Proprioception Impaired Details: Impaired LLE Additional Comments: impaired in LLE - inconsistant reporting Coordination Gross Motor Movements are Fluid and Coordinated: No Fine Motor Movements are Fluid and Coordinated: No Motor  Motor Motor: Abnormal tone;Tetraplegia;Abnormal postural alignment and control;Motor impersistence  Mobility Bed Mobility Bed Mobility: Rolling Left;Supine to Sit;Sit to Supine Rolling Left: Independent Left Sidelying to Sit: Independent Supine to Sit: Independent Sit to Supine: Independent Sit to Sidelying Left: Independent Transfers Transfers: Sit to Stand;Stand to Sit;Stand Pivot Transfers Sit to Stand: Independent with assistive device Stand to Sit: Independent with assistive device Stand Pivot Transfers: Independent with assistive device Transfer (Assistive device): Rolling walker Locomotion  Gait Ambulation: Yes Gait Assistance: Independent with assistive device Gait Distance (Feet): 150 Feet Assistive device: Rolling walker (with L foot up brace) High Level Ambulation High Level Ambulation: Other high level ambulation High Level Ambulation - Other Comments: community mobility with supervision for uneven surfaces Stairs / Additional Locomotion Stairs: Yes Stairs Assistance: Supervision/Verbal cueing Stair Management Technique: One rail Right;Two rails;Step to pattern;Sideways Number of Stairs: 12 Ramp: Supervision/Verbal cueing Curb: Supervision/Verbal cueing Pick  up small object from the floor assist level: Supervision/Verbal cueing Wheelchair Mobility Wheelchair Mobility: No  Trunk/Postural Assessment  Cervical Assessment Cervical Assessment: Exceptions to [facility] (decreased cervical AROM) Thoracic Assessment Thoracic Assessment:  (slighty flexed posture) Lumbar Assessment Lumbar Assessment:  (posterior tilt) Postural Control Postural Control: Deficits on evaluation Protective Responses: delayed without UE support  Balance Balance Balance Assessed: Yes Static  Sitting Balance Static Sitting - Balance Support: Feet supported Static Sitting - Level of Assistance: 7: Independent Dynamic Sitting Balance Dynamic Sitting - Balance Support: Feet supported Dynamic Sitting - Level of Assistance: 7: Independent Dynamic Sitting - Balance Activities: Forward lean/weight shifting;Reaching for objects;Reaching across midline Static Standing Balance Static Standing - Balance Support: Bilateral upper extremity supported Static Standing - Level of Assistance: 6: Modified independent (Device/Increase time) (with UE support) Dynamic Standing Balance Dynamic Standing - Balance Support: Bilateral upper extremity supported;During functional activity Dynamic Standing - Level of Assistance: 6: Modified independent (Device/Increase time) (with UE support) Dynamic Standing - Balance Activities: Forward lean/weight shifting;Reaching for objects Extremity Assessment      RLE Assessment RLE Assessment: Within Functional Limits LLE Assessment General Strength Comments: grossly 4-/5 except 3-/5 ankle DF LLE Tone LLE Tone: Modified Ashworth Modified Ashworth Scale for Grading Hypertonia LLE: More marked increase in muscle tone through most of the ROM, but affected part(s) easily moved I   Faye Hoops, PT, DPT, CBIS  04/22/2024, 3:48 PM

## 2024-04-22 NOTE — Progress Notes (Signed)
 Inpatient Rehabilitation Care Coordinator Discharge Note   Patient Details  Name: Nathaniel Hobbs MRN: [id number] Date of Birth: [birth year]   Discharge location: HOME WITH WIFE AND DAUGHTER WHO CAN ALMOST PROVIE 24/7 SUPERVISION  Length of Stay: 15 DAYS  Discharge activity level: MOD/I LEVEL  Home/community participation: ACTIVE  Patient response WG:NFAOZH Literacy - How often do you need to have someone help you when you read instructions, pamphlets, or other written material from your doctor or pharmacy?: Never  Patient response YQ:MVHQIO Isolation - How often do you feel lonely or isolated from those around you?: Never  Services provided included: MD, RD, PT, OT, RN, CM, Pharmacy, Neuropsych, SW  Financial Services:  Field seismologist Utilized: Private Insurance [employer]  Choices offered to/list presented to: PT  Follow-up services arranged:  Outpatient, DME    Outpatient Servicies: [facility] WILL CALL TO SCHEDULE FOLLOW UP APPOINTMENTS DME : [facility] ROLLING WALKER HAS TUB BENCH FROM ANOTHER FAMILY MEMBER    Patient response to transportation need: Is the patient able to respond to transportation needs?: Yes In the past 12 months, has lack of transportation kept you from medical appointments or from getting medications?: No In the past 12 months, has lack of transportation kept you from meetings, work, or from getting things needed for daily living?: No   Patient/Family verbalized understanding of follow-up arrangements:  Yes  Individual responsible for coordination of the follow-up plan: SELF [phone]  Confirmed correct DME delivered: [name] 04/22/2024    Comments (or additional information):PT REACHED GOALS OF MOD/I LEVEL. WIFE WAS HERE OVER THE WEEKEND AND DID ACTUAL CAR TRANSFER. BOTH FEEL PREPARED FOR DISCHARGE HOME  Summary of Stay    Date/Time Discharge Planning CSW  04/22/24 0958 Pt has done well and made good progress-met  goals of mod/i levle and will be home with wife and daughter who almost can provide 24/7 supervision. OP referral made to [facility] rehab RGD  04/15/24 1018 Home with wife who does work 8-4;30 pm, can take time off if needed. Daughter can be there unitl 3:00 so 1 1/2 hours not covered. Awaiting team's recommendations RGD       Phelix Fudala G

## 2024-04-22 NOTE — Progress Notes (Signed)
 Physical Therapy Session Note  Patient Details  Name: Markandrew Schnick MRN: [id number] Date of Birth: 29-Mar-1961  Today's Date: 04/22/2024 PT Individual Time: 1050-1135 PT Individual Time Calculation (min): 45 min   Short Term Goals: Week 2:  PT Short Term Goal 1 (Week 2): = LTGS  Skilled Therapeutic Interventions/Progress Updates: Patient sitting in recliner on entrance to room. Patient alert and agreeable to PT session.   Patient reported unrated low back pain/tightness on R (pt participated in stretch noted below to decrease with education to follow and pt reporting decrease in tightness). Pt transported to ortho gym in [facility] for energy conservation and attempted transfer to car with modI (RW). Pt reported that [facility] that he will be d/c in has bigger space in front for pt to advance LE into car. Pt unable to attempt simulated car transfer without minA, and also reported that he had practiced this over the weekend with therapy in pt's actual car with CGA). Pt picked up orange cone without AD and with supervision for care tool update. Pt ambulated from ortho gym<day room gym (and then back to room following stretch) modI, and pt self correcting increased WB on R with one moment of leaning RW to the R, but able to maintain balance without intervention. Pt performed supine<>sit edge of mat independently. Pt supine on mat and guided in hip rotation stretch with cues to maintain B posterior deltoids on mat (x 10 performed with 2 second hold in stretch). Noted decrease ROM when twisting to the R with pt encouraged to continue the stretch when supine in bed to decrease tightness.  Patient sitting recliner at end of session with brakes locked, modI in room and all needs within reach.      Therapy Documentation Precautions:  Precautions Precautions: Fall, Cervical Recall of Precautions/Restrictions: Intact Required Braces or Orthoses: Other Brace Other Brace: soft collar for comfort Restrictions Weight  Bearing Restrictions Per Provider Order: No  Therapy/Group: Individual Therapy  Emmajo Bennette PTA 04/22/2024, 12:34 PM

## 2024-04-22 NOTE — Progress Notes (Signed)
 Physical Therapy Session Note  Patient Details  Name: Nathaniel Hobbs MRN: [id number] Date of Birth: 02-28-1961  Today's Date: 04/22/2024 PT Individual Time: 1300-1358 PT Individual Time Calculation (min): 58 min   Short Term Goals: Week 1:  PT Short Term Goal 1 (Week 1): Pt will transfer sup to sit w/ min A PT Short Term Goal 1 - Progress (Week 1): Met PT Short Term Goal 2 (Week 1): Pt will transfer sit to stand w/ CGA PT Short Term Goal 2 - Progress (Week 1): Met PT Short Term Goal 3 (Week 1): Pt will amb w/ RW x 100 and C/S PT Short Term Goal 3 - Progress (Week 1): Met PT Short Term Goal 4 (Week 1): Pt will negotiate stairs w/ 1 rail and min A. PT Short Term Goal 4 - Progress (Week 1): Met Week 2:  PT Short Term Goal 1 (Week 2): = LTGS  Skilled Therapeutic Interventions/Progress Updates:  Pt seen to complete grad day activities and preparation for discharge. Pt mod I in room with RW and reporting feeling confident for discharge. Retested assessments for d/c (see d/c summary) and fall risk assessment completed. Pt mod I with Rw for basic transfers throughout session. Functional gait training on unit with L foot up brace with emphasis on pt clearing LLE especially as fatigued.Adelene Adolf I for household distances in between rooms. Performed standing scale weight for nursing with pt transferring without device with CGA to step up onto the step. TUG administered (see results below) with improvement since initial assessment completed. Stair negotiation for home and community access with overall supervision and cues for which foot to lead with and attention to LLE foot placement. Pt fatigued by end of session, returned to room and set up in recliner. Reviewed importance of HEP, fall risk, use of RW and foot up brace (his delivered during session).      Therapy Documentation Precautions:  Precautions Precautions: Fall, Cervical Recall of Precautions/Restrictions: Intact Required Braces or Orthoses:  Other Brace Other Brace: soft collar for comfort Restrictions Weight Bearing Restrictions Per Provider Order: No   Pain:  Denies pain.     Balance: Balance Balance Assessed: Yes Standardized Balance Assessment Standardized Balance Assessment: Timed Up and Go Test Timed Up and Go Test TUG: Normal TUG Normal TUG (seconds): 26.93 (with RW)   TUG = 42.95 sec without device (min assist)     Therapy/Group: Individual Therapy  [name], PT, DPT, CBIS  04/22/2024, 3:32 PM

## 2024-04-22 NOTE — Progress Notes (Signed)
 PROGRESS NOTE   Subjective/Complaints:   Pt ok with going home on eliquis- will send home on it to prevent DVTs per CHEST guidelines-  for a total of 2 months from surgery date.   LBM yesterday- pretty easy, but getitng solid- not loose.  Muscle spasm so bad yesterday, drew legs up towards chest- off foot plate.    ROS:    Pt denies SOB, abd pain, CP, N/V/C/D, and vision changes   Bladder urgency- continued + constipation -improved   Objective:   No results found.  No results for input(s): "WBC", "HGB", "HCT", "PLT" in the last 72 hours.   No results for input(s): "NA", "K", "CL", "CO2", "GLUCOSE", "BUN", "CREATININE", "CALCIUM " in the last 72 hours.    Intake/Output Summary (Last 24 hours) at 04/22/2024 0846 Last data filed at 04/22/2024 0745 Gross per 24 hour  Intake 592 ml  Output --  Net 592 ml         Physical Exam: Vital Signs Blood pressure 134/62, pulse (!) 53, temperature 97.6 F (36.4 C), temperature source Oral, resp. rate 17, height 5\' 9"  (1.753 m), weight 98 kg, SpO2 98%.      General: awake, alert, appropriate, sitting up in bedside chair- ate <10% of tray;  NAD HENT: conjugate gaze; oropharynx moist CV: regular rhythm, bradycardic  rate; no JVD Pulmonary: CTA B/L; no W/R/R- good air movement GI: soft, NT, ND, (+)BS- more normoactive Psychiatric: appropriate- brighter affect about d/c tomorrow Neurological: Ox3 Still MAS of 1+ to 2 in hands- with decreased fine motor movement LE's MAS of 2-3- but hasn't gotten meds yet this AM Very little sensation in hands B/L  Extremities: 1+ edema LUE and no LE edema this AM Psych: Pt's affect is appropriate. Pt is cooperative Skin: Surgical incision CDI Neuro:     Mental Status: AAOx3,  no apparent cognitive deficits noted CRANIAL NERVES: 2-12 grossly intact     MOTOR: No change 04/19/24 RUE: 4/5 Deltoid, 4/5 Biceps, 4/5 Triceps, 4-/5  Grip LUE: 4-/5 Deltoid, 4-/5 Biceps, 4-/5 Triceps, 4-/5 Grip RLE: HF 4-/5, KE 4/5, ADF 4-/5, APF 4-/5 LLE: HF 3/5, KE 4-/5, ADF 4-/5, APF 4-/5  SENSORY: Intact but altered light touch all 4 extremities   MSK: No joint tenderness/swelling noted.    Assessment/Plan: 1. Functional deficits which require 3+ hours per day of interdisciplinary therapy in a comprehensive inpatient rehab setting. Physiatrist is providing close team supervision and 24 hour management of active medical problems listed below. Physiatrist and rehab team continue to assess barriers to discharge/monitor patient progress toward functional and medical goals  Care Tool:  Bathing              Bathing assist Assist Level: Supervision/Verbal cueing     Upper Body Dressing/Undressing Upper body dressing   What is the patient wearing?: Pull over shirt    Upper body assist Assist Level: Supervision/Verbal cueing    Lower Body Dressing/Undressing Lower body dressing      What is the patient wearing?: Pants     Lower body assist Assist for lower body dressing: Contact Guard/Touching assist     Toileting Toileting    Toileting assist Assist for  toileting: Contact Guard/Touching assist     Transfers Chair/bed transfer  Transfers assist     Chair/bed transfer assist level: Supervision/Verbal cueing     Locomotion Ambulation   Ambulation assist      Assist level: Contact Guard/Touching assist Assistive device: Walker-rolling Max distance: 500'   Walk 10 feet activity   Assist     Assist level: Supervision/Verbal cueing Assistive device: Walker-rolling, Orthosis   Walk 50 feet activity   Assist    Assist level: Supervision/Verbal cueing Assistive device: Walker-rolling, Orthosis    Walk 150 feet activity   Assist Walk 150 feet activity did not occur: Safety/medical concerns  Assist level: Supervision/Verbal cueing Assistive device: Walker-rolling, Orthosis    Walk  10 feet on uneven surface  activity   Assist     Assist level: Contact Guard/Touching assist Assistive device: Walker-rolling, Orthosis   Wheelchair     Assist Is the patient using a wheelchair?: Yes (Per PT documentation) Type of Wheelchair: Manual    Wheelchair assist level: Dependent - Patient 0%      Wheelchair 50 feet with 2 turns activity    Assist        Assist Level: Dependent - Patient 0%   Wheelchair 150 feet activity     Assist      Assist Level: Dependent - Patient 0%   Blood pressure 134/62, pulse (!) 53, temperature 97.6 F (36.4 C), temperature source Oral, resp. rate 17, height 5\' 9"  (1.753 m), weight 98 kg, SpO2 98%.   Medical Problem List and Plan: 1. Functional deficits secondary to cervical stenosis/myelopathy status post ACDF C3-5 04/03/2024             -patient may shower, cover incision please             -ELOS/Goals: 14-16 days, PT/OT supervision/Mod I            D/c4/30  Con't CIR PT and OT  D/c tomorrow- family training today             -PFRAOs b/l 2.  Antithrombotics: -DVT/anticoagulation:  Pharmaceutical: Lovenox .  Check vascular  4/28- will change Lovenox  to Elqiuis- will need a total of 2months from day of surgery - since walking pretty well             -antiplatelet therapy: Aspirin  81 mg daily 3. Pain Management: Neurontin  400 mg daily, Robaxin  500 mg every 6 hours as needed, oxycodone   as needed  4/17therapy reporting patient has been having some left knee pain from chronic arthritis, will start Voltaren   Order K-pad  4/18 neck pain and knee pain much improved today, continue current regimen  4/20- Reports neck pain is tolerable-   4/22- Neck pain/overall pain doing better, except for spasticity pain  4/23- taking pain meds q4 hours around the clock per nursing- will d/w pt when it's possible to try and reduce dosing  4/24- will change Oxy to Norco  and will take tylenol  when can go without Norco  4/25- doing OK-  pain doing better except neck sore from bed- will try Lidoderm  patches 8pm to 8am  4/28- stopped patches since not using them- last took Norco 4/25 4. Mood/Behavior/Sleep: Provide emotional support             -antipsychotic agents: N/A 5. Neuropsych/cognition: This patient is capable of making decisions on his own behalf. 6. Skin/Wound Care: Routine skin checks 7. Fluids/Electrolytes/Nutrition: Routine in and outs with follow-up chemistries 8.  Constipation vs Neurogenic bowel              -  Scheduled MiraLAX  twice daily and continue Senokot.  Consider suppository bowel program if not having regular Bms  -Pt doesn't want to try suppository, will do sorbitol  60ml for now  -4/17 patient had very large bowel movement yesterday, continent.   Continue MiraLAX  and Senokot and monitor. -4/18 LBM was after sorbitol - continent but was was liquid after this medication, will check abdominal xray to assess stool burden.  Will order dulcolax capsule today, pt reports this has worked will for him in the past. 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 Continue suppository at night PRN for now pt resistant to trying. 4/19- had blow out last night- however I discussed with pt about neurogenic bowel as well as constipation from baclofen  playing a role- -will increase Senna to 3 tabs/day since increasing baclofen  4/20- Feels like could have BM this AM- but if doesn't was hoping for another Dulcolax PO- explained increased his Senna, but if needs additional meds, let nursing know 4/21 Reports good BM yesterday, continue current regimen  4/22- had a "blow out" that barely got to bathroom for last night- but we discussed if we go up on Baclofen , could balance these issues out. Wait to reduce Senna 4/23- LBM 2 days ago- denies constipation- wants ot wait for intervention 4/24- will decrease Senna to 2 tabs/day 4/25- LBM yesterday- was more formed 4/26 add miralax  daily 4/28- LBM 2 days ago- miralax  was added to  Senna, but hasn't gone yet.  4/29- LBM yesterday- getting solid- of note, pt still doesn't understand why on bowel meds- educated that it's the baclofen  as well as his SCI that is causing his guts to slow down- this MIGHT get better, but less likely.  9.  Hypertension.  Vasotec  10 mg twice daily.  Monitor with increased mobility             -4/17-19 controlled overall continue to monitor  4/21-4/22 overall controlled, continue current regimen   4/23- BP overall controlled- a little elevated this AM- also bradycardic this AM- will monitor Sx's and vitals  4/28- BP very slightly elevated just this AM, before meds- rest of day is good- - again bradycardic-     04/22/2024    4:28 AM 04/21/2024    7:53 PM 04/21/2024    1:10 PM  Vitals with BMI  Systolic 134 148 366  Diastolic 62 83 70  Pulse 53 62 53    10.  CAD status post PCI 2000 08/2012.  Follow-up with cardiology services Dr. Burney Carter. Denies CP 11.  Hyperlipidemia.  Lipitor  12.  Obesity.  BMI 34.18.  Dietary follow-up  4/19- BMI down to 31.91 13. Possible Neurogenic bladder             -PVR/bladder scans IC if greater than 350  - 4/16 Will ask nursing check bladder scan/PVR  -4/17 patient did have a bladder scan yesterday to 12, will asked nursing to try to check PVRs  -4/18 patient had 3 PVRs all under 100, and continent  4/19- will d/c bladder scans  4/21 remains continent   4/23- Having bladder urgency- will check CBC with diff in AM and if WBC elevated, will check U/A- however is common with SCI patients-   4/24- WBC 6.6k 14) Spasticity - Will start baclofen  5 mg 3 times daily -4/16-8 improved continue current regimen and monitor 4/19- Spasticity sounds like it's progressing- will increase Baclofen  to 10 mg TID  4/20- pt reports is a little better, however really wants to increase as soon as  possible- I explained we can increase on Tuesday AM to 15 mg TID  4/22- Increasing Baclofen  to 15 mg TID- for continued spasticity-  educated could make more sleepy/more constipated, but just increased Senna, so should help with that.   4/23- pt was tired yesterday AM, but it got better- con't regimen  4/25- spasticity doing somewhat better  4/28- interested in increasing Baclofen  to 20 mg TID at d/c.   4/29- Increased to 20 mg TID- wil monitor for sedation and constipation-  I spent a total of 36   minutes on total care today- >50% coordination of care- due to  Team conference to finalize d/c- went over [facility] message on his phone for appt today- at 1pm- asked pt to reschedule since in hospital- and increased baclofen - went over risks/benefits of higher dose.    Of note, called insurance for P2P yesterday at 4pm- was on hold for 24 minutes and it said I should leave message to get a call back for P2P- so far have not heard back- left all my contact info- pt's Name, DOB, insurance ID, reference number, my name, and contact info and times to call back.    LOS: 14 days A FACE TO FACE EVALUATION WAS PERFORMED  Nathaniel Hobbs 04/22/2024, 8:46 AM

## 2024-04-22 NOTE — Progress Notes (Signed)
 Inpatient Rehabilitation Discharge Medication Review by a Pharmacist  A complete drug regimen review was completed for this patient to identify any potential clinically significant medication issues.  High Risk Drug Classes Is patient taking? Indication by Medication  Antipsychotic No   Anticoagulant Yes Apixaban- vte ppx s/p surgery  Antibiotic No   Opioid Yes Norco- acute pain  Antiplatelet No   Hypoglycemics/insulin No   Vasoactive Medication Yes Vasotec - HTN  Chemotherapy No   Other Yes Baclofen - muscle spasms Lipitor - HLD Gabapentin - neuropathic pain Methocarbamol - muscle spasms     Type of Medication Issue Identified Description of Issue Recommendation(s)  Drug Interaction(s) (clinically significant)     Duplicate Therapy     Allergy     No Medication Administration End Date     Incorrect Dose     Additional Drug Therapy Needed     Significant med changes from prior encounter (inform family/care partners about these prior to discharge).    Other       Clinically significant medication issues were identified that warrant physician communication and completion of prescribed/recommended actions by midnight of the next day:  No   Time spent performing this drug regimen review (minutes):  30   Elaijah Munoz BS, PharmD, BCPS Clinical Pharmacist 04/22/2024 9:14 AM  Contact: [phone] after 3 PM  "Be curious, not judgmental..." -Rumalda Counter

## 2024-04-22 NOTE — Plan of Care (Signed)
  Problem: Consults Goal: RH SPINAL CORD INJURY PATIENT EDUCATION Description:  See Patient Education module for education specifics.  Outcome: Progressing   Problem: RH SAFETY Goal: RH STG ADHERE TO SAFETY PRECAUTIONS W/ASSISTANCE/DEVICE Description: STG Adhere to Safety Precautions With  cues Assistance/Device. Outcome: Progressing   Problem: RH PAIN MANAGEMENT Goal: RH STG PAIN MANAGED AT OR BELOW PT'S PAIN GOAL Description: Manage pain < 4 with prns Outcome: Progressing   Problem: RH KNOWLEDGE DEFICIT SCI Goal: RH STG INCREASE KNOWLEDGE OF SELF CARE AFTER SCI Description: Patient and Spouse will be able to manage care at discharge using educational resources independently Outcome: Progressing

## 2024-04-23 ENCOUNTER — Other Ambulatory Visit (HOSPITAL_COMMUNITY): Payer: Self-pay

## 2024-04-23 MED ORDER — BACLOFEN 20 MG PO TABS
20.0000 mg | ORAL_TABLET | Freq: Three times a day (TID) | ORAL | 0 refills | Status: DC
  Filled 2024-04-23: qty 90, 30d supply, fill #0

## 2024-04-23 NOTE — Patient Care Conference (Signed)
 Inpatient RehabilitationTeam Conference and Plan of Care Update Date: 04/22/2024   Time: 1133 am    Patient Name: Nathaniel Hobbs      Medical Record Number: [id number]  Date of Birth: [birth year] Sex: Male         Room/Bed: 4W02C/4W02C-01 Payor Info: Payor: Raina Bunting / Plan: [employer] [facility]  / Product Type: *No Product type* /    Admit Date/Time:  04/08/2024  1:33 PM  Primary Diagnosis:  Cervical myelopathy [facility])  Hospital Problems: Principal Problem:   Cervical myelopathy [facility])    Expected Discharge Date: Expected Discharge Date: 04/23/24  Team Members Present: Physician leading conference: Dr. Celia Coles Social Worker Present: Adrianna Albee, LCSW Nurse Present: Jerene Monks, RN PT Present: Gita Lamb, PT OT Present: Nila Barth, OT PPS Coordinator present : Jestine Moron, SLP     Current Status/Progress Goal Weekly Team Focus  Bowel/Bladder   Pt is continent of bowel/bladder   Pt to remain continent of bowel/bladder   Will assess qshift and PRN    Swallow/Nutrition/ Hydration               ADL's   mod I overall with RW   mod I with RW   D/C today    Mobility   supervision overall with RW for transfers and gait, supervision/CGA for stairs   mod I overall transfers and gait, supervision for stairs  d/c planning, spasticity management, dynamic balance and gait, strengthening, education    Communication                Safety/Cognition/ Behavioral Observations               Pain   Pt currently pain free   Pt to remain pain free   Will assess qshift and PRN    Skin   Pt has a neck incision closed with glue   Pt's neck incision is healing  Will assess qshift and PRN      Discharge Planning:  Pt has done well and made good progress-met goals of mod/i levle and will be home with wife and daughter who almost can provide 24/7 supervision. OP referral made to [facility] rehab    Team Discussion: Patient admitted post ACDF secondary to cervical  stenosis/myelopathy. Patient with pain, spasticity: medication adjusted by MD. Patient limited by fear of falling and difficulty with LLE movement.   Patient on target to meet rehab goals: yes, Patient requires mod I assistance  with ADLs using a RW. Patient requires supervision assistance with transfers and gait using a RW. Discharge goals are set for Mod I assistance overall using a RW.   *See Care Plan and progress notes for long and short-term goals.   Revisions to Treatment Plan:  Left foot brace   Teaching Needs: Safety, medications, toileting, transfers, Spasticity management Education,Balance, gait/strengthening education,etc.    Current Barriers to Discharge: Decreased caregiver support, Neurogenic bowel and bladder, and Weight  Possible Resolutions to Barriers: Family Education Outpatient follow-up DME: Cane/TTB    Medical Summary Current Status: COntinent of bowel and bladder with a lot of Senna and Miralax - neurogenic bowel- severe spasticity- pain doing well-  Barriers to Discharge: Spasticity;Self-care education;Weight bearing restrictions;Neurogenic Bowel & Bladder  Barriers to Discharge Comments: going to outpt therapy after d/c- barriers- spasticity- family ed done- L foot up brace needed-  needs more education- he doesn't want to use assistive devices Possible Resolutions to [employer] Focus: increased baclofen  to 20 mg TID today- will monitor for side  effects- d/c 4/30   Continued Need for Acute Rehabilitation Level of Care: The patient requires daily medical management by a physician with specialized training in physical medicine and rehabilitation for the following reasons: Direction of a multidisciplinary physical rehabilitation program to maximize functional independence : Yes Medical management of patient stability for increased activity during participation in an intensive rehabilitation regime.: Yes Analysis of laboratory values and/or radiology reports  with any subsequent need for medication adjustment and/or medical intervention. : Yes   I attest that I was present, lead the team conference, and concur with the assessment and plan of the team.   Jerene Monks 04/22/2024, 1133 am

## 2024-04-23 NOTE — Progress Notes (Signed)
 PROGRESS NOTE   Subjective/Complaints:  Pt reports he had neck pain last night- he thinks maybe due to bed-  took Norco for the first time in "forever".     Increase in baclofen  did great-loved the response.   Said LBM overnight. Wore foot up brace to sleep in last night-  So foot a little swollen.   Did well with car transfers.    ROS:    Pt denies SOB, abd pain, CP, N/V/C/D, and vision changes   Bladder urgency- continued + constipation -improved   Objective:   No results found.  No results for input(s): "WBC", "HGB", "HCT", "PLT" in the last 72 hours.   No results for input(s): "NA", "K", "CL", "CO2", "GLUCOSE", "BUN", "CREATININE", "CALCIUM " in the last 72 hours.    Intake/Output Summary (Last 24 hours) at 04/23/2024 0813 Last data filed at 04/23/2024 0720 Gross per 24 hour  Intake 773 ml  Output --  Net 773 ml         Physical Exam: Vital Signs Blood pressure 123/62, pulse (!) 51, temperature 98.7 F (37.1 C), resp. rate 17, height 5\' 9"  (1.753 m), weight 95.7 kg, SpO2 98%.       General: awake, alert, appropriate, sitting up in bedside chair; about to eat; NAD HENT: conjugate gaze; oropharynx moist CV: regular rhythm, bradycardic rate; no JVD Pulmonary: CTA B/L; no W/R/R- good air movement GI: soft, NT, ND, (+)BS- normoactive Psychiatric: appropriate Neurological: Ox3  Still MAS of 1+ to 2 in hands- with decreased fine motor movement- still hard /cannot make fist on L- L handed LE's MAS of 2-3- but hasn't gotten meds yet this AM Very little sensation in hands B/L  Extremities: 1+ edema LUE and no LE edema this AM Psych: Pt's affect is appropriate. Pt is cooperative Skin: Surgical incision CDI Neuro:     Mental Status: AAOx3,  no apparent cognitive deficits noted CRANIAL NERVES: 2-12 grossly intact     MOTOR: No change 04/19/24 RUE: 4/5 Deltoid, 4/5 Biceps, 4/5 Triceps, 4-/5  Grip LUE: 4-/5 Deltoid, 4-/5 Biceps, 4-/5 Triceps, 4-/5 Grip RLE: HF 4-/5, KE 4/5, ADF 4-/5, APF 4-/5 LLE: HF 3/5, KE 4-/5, ADF 4-/5, APF 4-/5  SENSORY: Intact but altered light touch all 4 extremities   MSK: No joint tenderness/swelling noted.    Assessment/Plan: 1. Functional deficits which require 3+ hours per day of interdisciplinary therapy in a comprehensive inpatient rehab setting. Physiatrist is providing close team supervision and 24 hour management of active medical problems listed below. Physiatrist and rehab team continue to assess barriers to discharge/monitor patient progress toward functional and medical goals  Care Tool:  Bathing    Body parts bathed by patient: Right arm, Left arm, Chest, Abdomen, Front perineal area, Buttocks, Right upper leg, Right lower leg, Left upper leg, Left lower leg, Face         Bathing assist Assist Level: Independent with assistive device     Upper Body Dressing/Undressing Upper body dressing   What is the patient wearing?: Pull over shirt    Upper body assist Assist Level: Independent with assistive device    Lower Body Dressing/Undressing Lower body dressing  What is the patient wearing?: Underwear/pull up, Pants     Lower body assist Assist for lower body dressing: Independent with assitive device     Toileting Toileting    Toileting assist Assist for toileting: Independent with assistive device     Transfers Chair/bed transfer  Transfers assist     Chair/bed transfer assist level: Independent with assistive device     Locomotion Ambulation   Ambulation assist      Assist level: Independent with assistive device Assistive device: Walker-rolling Max distance: 150'   Walk 10 feet activity   Assist     Assist level: Independent with assistive device Assistive device: Walker-rolling, Orthosis   Walk 50 feet activity   Assist    Assist level: Independent with assistive  device Assistive device: Orthosis, Walker-rolling    Walk 150 feet activity   Assist Walk 150 feet activity did not occur: Safety/medical concerns  Assist level: Independent with assistive device Assistive device: Walker-rolling, Orthosis    Walk 10 feet on uneven surface  activity   Assist     Assist level: Supervision/Verbal cueing Assistive device: Walker-rolling, Orthosis   Wheelchair     Assist Is the patient using a wheelchair?: No Type of Wheelchair: Manual    Wheelchair assist level: Dependent - Patient 0%      Wheelchair 50 feet with 2 turns activity    Assist        Assist Level: Dependent - Patient 0%   Wheelchair 150 feet activity     Assist      Assist Level: Dependent - Patient 0%   Blood pressure 123/62, pulse (!) 51, temperature 98.7 F (37.1 C), resp. rate 17, height 5\' 9"  (1.753 m), weight 95.7 kg, SpO2 98%.   Medical Problem List and Plan: 1. Functional deficits secondary to cervical stenosis/myelopathy status post ACDF C3-5 04/03/2024             -patient may shower, cover incision please             -ELOS/Goals: 14-16 days, PT/OT supervision/Mod I            D/c4/30  D/c today Will go home with L foot up- doesn't need B/L -reminded to not wear foot up to sleep in- can cause swelling, which did last night.  2.  Antithrombotics: -DVT/anticoagulation:  Pharmaceutical: Lovenox .  Check vascular  4/28- will change Lovenox  to Elqiuis- will need a total of 2months from day of surgery - since walking pretty well             -antiplatelet therapy: Aspirin  81 mg daily 3. Pain Management: Neurontin  400 mg daily, Robaxin  500 mg every 6 hours as needed, oxycodone   as needed  4/17therapy reporting patient has been having some left knee pain from chronic arthritis, will start Voltaren   Order K-pad  4/18 neck pain and knee pain much improved today, continue current regimen  4/20- Reports neck pain is tolerable-   4/22- Neck pain/overall  pain doing better, except for spasticity pain  4/23- taking pain meds q4 hours around the clock per nursing- will d/w pt when it's possible to try and reduce dosing  4/24- will change Oxy to Norco  and will take tylenol  when can go without Norco  4/25- doing OK- pain doing better except neck sore from bed- will try Lidoderm  patches 8pm to 8am  4/28- stopped patches since not using them- last took Norco 4/25  4/30- Took Norco last night because bed, but doesn't think  he'll need to go home with- educated that other meds, he will get 30 days of meds 4. Mood/Behavior/Sleep: Provide emotional support             -antipsychotic agents: N/A 5. Neuropsych/cognition: This patient is capable of making decisions on his own behalf. 6. Skin/Wound Care: Routine skin checks 7. Fluids/Electrolytes/Nutrition: Routine in and outs with follow-up chemistries 8.  Constipation vs Neurogenic bowel              - Scheduled MiraLAX  twice daily and continue Senokot.  Consider suppository bowel program if not having regular Bms -4/18 LBM was after sorbitol - continent but was was liquid after this medication, will check abdominal xray to assess stool burden.  Will order dulcolax capsule today, pt reports this has worked will for him in the past. 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 Continue suppository at night PRN for now pt resistant to trying.  4/24- will decrease Senna to 2 tabs/day 4/25- LBM yesterday- was more formed 4/26 add miralax  daily 4/28- LBM 2 days ago- miralax  was added to Senna, but hasn't gone yet.  4/29- LBM yesterday- getting solid- of note, pt still doesn't understand why on bowel meds- educated that it's the baclofen  as well as his SCI that is causing his guts to slow down- this MIGHT get better, but less likely.  4/30- LBM 4/29-  denies constipation- doesn't want meds before he leaves- educated that Senna and Miralax  are OTC 9.  Hypertension.  Vasotec  10 mg twice daily.  Monitor with increased  mobility             -4/17-19 controlled overall continue to monitor  4/21-4/22 overall controlled, continue current regimen   4/23- BP overall controlled- a little elevated this AM- also bradycardic this AM- will monitor Sx's and vitals  4/28- BP very slightly elevated just this AM, before meds- rest of day is good- - again bradycardic-   4/30- BP controlled- just bradycardic still- con't regimen    04/23/2024    5:27 AM 04/22/2024    7:46 PM 04/22/2024    2:47 PM  Vitals with BMI  Systolic 123 127 191  Diastolic 62 72 59  Pulse 51 55 53    10.  CAD status post PCI 2000 08/2012.  Follow-up with cardiology services Dr. Burney Carter. Denies CP 11.  Hyperlipidemia.  Lipitor  12.  Obesity.  BMI 34.18.  Dietary follow-up  4/19- BMI down to 31.91 13. Possible Neurogenic bladder             -PVR/bladder scans IC if greater than 350  - 4/16 Will ask nursing check bladder scan/PVR  -4/17 patient did have a bladder scan yesterday to 12, will asked nursing to try to check PVRs  -4/18 patient had 3 PVRs all under 100, and continent  4/19- will d/c bladder scans  4/21 remains continent   4/23- Having bladder urgency- will check CBC with diff in AM and if WBC elevated, will check U/A- however is common with SCI patients-   4/24- WBC 6.6k 14) Spasticity - Will start baclofen  5 mg 3 times daily -4/16-8 improved continue current regimen and monitor 4/19- Spasticity sounds like it's progressing- will increase Baclofen  to 10 mg TID  4/20- pt reports is a little better, however really wants to increase as soon as possible- I explained we can increase on Tuesday AM to 15 mg TID  4/22- Increasing Baclofen  to 15 mg TID- for continued spasticity- educated could make more sleepy/more constipated,  but just increased Senna, so should help with that.   4/23- pt was tired yesterday AM, but it got better- con't regimen  4/25- spasticity doing somewhat better  4/28- interested in increasing Baclofen  to 20 mg  TID at d/c.   4/29- Increased to 20 mg TID- wil monitor for sedation and constipation-  4/30- said worked SO  much better- happy with dose  I spent a total of  35  minutes on total care today- >50% coordination of care- due to  D/w pt about approval from insurance; d/w pt about spasticity, d/w PA about need for increase Baclofen  , f/u with [facility], and me   The patient is medically ready for discharge to home and will need follow-up with [facility] PM[facility]R. In addition, they will need to follow up with their PCP, Neurosurgery.      LOS: 15 days A FACE TO FACE EVALUATION WAS PERFORMED  Javier Gell 04/23/2024, 8:13 AM

## 2024-04-23 NOTE — Progress Notes (Signed)
 Recreational Therapy Discharge Summary Patient Details  Name: Nathaniel Hobbs MRN: [id number] Date of Birth: May 14, 1961 Today's Date: 04/23/2024 Comments on progress toward goals: Pt has made good progress during LOS and is excited about discharge home today at Mod I level.  TR sessions focused on pt education emphasizing leisure education, activity analysis/modifications, stress management, & community reintegration.  Pt participated in simulated community reintegration tasks on the hospital grounds at [facility] ambulatory level using RW.  This included functional ambulation on various outdoor surfaces.  PT also participated in UE strengthening group with supervision.  Pt is anxiout to return home with hopes of engaging in his leisure interests in the near future.   Reasons for discharge: discharge from hospital  Follow-up: Outpatient  Patient/family agrees with progress made and goals achieved: Yes  Meriam Chojnowski 04/23/2024, 9:08 AM

## 2024-04-23 NOTE — Progress Notes (Signed)
 Patient reported pain to neck once this shift and he was medicated with PRN hydrocodone  and robaxin . Both were effective. She slept well this shift. He is anticipating going home today and has a good understanding of home safety needs. No other concerns this shift.

## 2024-04-30 ENCOUNTER — Ambulatory Visit: Admitting: Physical Therapy

## 2024-04-30 NOTE — Therapy (Deleted)
 OUTPATIENT PHYSICAL THERAPY THORACOLUMBAR EVALUATION   Patient Name: Nathaniel Hobbs MRN: [id number] DOB:04/04/1961, 63 y.o., male Today's Date: 04/30/2024  END OF SESSION:   Past Medical History:  Diagnosis Date   High cholesterol    Hypertension    Past Surgical History:  Procedure Laterality Date   ANTERIOR CERVICAL DECOMP/DISCECTOMY FUSION N/A 04/03/2024   Procedure: ANTERIOR CERVICAL DECOMPRESSION/DISCECTOMY FUSION 2 LEVELS;  Surgeon: Jodeen Munch, MD;  Location: [facility] ORS;  Service: Neurosurgery;  Laterality: N/A;   Patient Active Problem List   Diagnosis Date Noted   Edema 04/08/2024   Constipation 04/05/2024   Obesity (BMI 30-39.9) 04/04/2024   Cervical myelopathy (HCC) 04/03/2024   Cervical spinal stenosis 04/03/2024   Myelomalacia (HCC) 04/03/2024   Weakness generalized 04/03/2024   Bilateral carpal tunnel syndrome 05/25/2023   Chronic right shoulder pain 03/28/2018   Hyperglycemia 03/28/2018   Generalized anxiety disorder 11/02/2015   Benign essential hypertension 10/28/2015   Hyperlipemia, mixed 12/10/2014   CAD (coronary artery disease) 10/01/2014    PCP: [facility]  REFERRING PROVIDER: Celia Coles, MD  REFERRING DIAG: ***  RATIONALE FOR EVALUATION AND TREATMENT: {HABREHAB:27488}  THERAPY DIAG: Difficulty in walking, not elsewhere classified  Muscle weakness (generalized)  Cervical myelopathy (HCC)  ONSET DATE: ***  FOLLOW-UP APPT SCHEDULED WITH REFERRING PROVIDER: {yes/no:20286}    SUBJECTIVE:                                                                                                                                                                                         SUBJECTIVE STATEMENT:  ***  PERTINENT HISTORY: ***  PAIN:    Pain Intensity: Present: /10, Best: /10, Worst: /10 Pain location: *** Pain Quality: {PAIN DESCRIPTION:21022940}  Radiating: {yes/no:20286}  Numbness/Tingling: {yes/no:20286} Focal  Weakness: {yes/no:20286} Aggravating factors: *** Relieving factors: *** 24-hour pain behavior: *** How long can you sit: How long can you stand: History of prior back injury, pain, surgery, or therapy: {yes/no:20286} Dominant hand: {RIGHT/LEFT:20294} Imaging: {yes/no:20286}  Red flags: Negative for bowel/bladder changes, saddle paresthesia, personal history of cancer, h/o spinal tumors, h/o compression fx, h/o abdominal aneurysm, abdominal pain, chills/fever, night sweats, nausea, vomiting, unrelenting pain, first onset of insidious LBP <20 y/o  PRECAUTIONS: {Therapy precautions:24002}  WEIGHT BEARING RESTRICTIONS: {Yes ***/No:24003}  FALLS: Has patient fallen in last 6 months? {fallsyesno:27318}  Living Environment Lives with: {OPRC lives with:25569::"lives with their family"} Lives in: {Lives in:25570} Stairs: {opstairs:27293} Has following equipment at home: {Assistive devices:23999}  Prior level of function: {PLOF:24004}  Occupational demands:   Hobbies:   Patient Goals: ***   OBJECTIVE:  Patient Surveys  {rehab surveys:24030}  Cognition Patient is oriented to  person, place, and time.  Recent memory is intact.  Remote memory is intact.  Attention span and concentration are intact.  Expressive speech is intact.  Patient's fund of knowledge is within normal limits for educational level.    Gross Musculoskeletal Assessment Tremor: None Bulk: Normal Tone: Normal No visible step-off along spinal column, no signs of scoliosis  GAIT: Distance walked: *** Assistive device utilized: {Assistive devices:23999} Level of assistance: {Levels of assistance:24026} Comments: ***  Posture: Lumbar lordosis: WNL Iliac crest height: Equal bilaterally Lumbar lateral shift: Negative  AROM AROM (Normal range in degrees) AROM  04/30/24  Cervical   Flexion (65)   Extension (30)   Right lateral flexion (25)   Left lateral flexion (25)   Right rotation (30)   Left  rotation (30)       Shoulder Right Left  Flexion (125)    Extension (15)    Abduction (40)    Adduction     Internal Rotation (45)    External Rotation (45)        (* = pain; Blank rows = not tested)    LE MMT: MMT (out of 5) Right 04/30/24 Left 04/30/24  Hip flexion    Hip extension    Hip abduction    Hip adduction    Hip internal rotation    Hip external rotation    Knee flexion    Knee extension    Ankle dorsiflexion    Ankle plantarflexion    Ankle inversion    Ankle eversion    (* = pain; Blank rows = not tested)  Sensation Grossly intact to light touch throughout bilateral LEs as determined by testing dermatomes L2-S2. Proprioception, stereognosis, and hot/cold testing deferred on this date.  Reflexes R/L Biceps: Triceps: Brachioradialis:  Knee Jerk (L3/4): 2+/2+  Ankle Jerk (S1/2): 2+/2+   Muscle Length Hamstrings: R: {NEGATIVE/POSITIVE ZOX:09604} L: {NEGATIVE/POSITIVE VWU:98119} Ely (quadriceps): R: {NEGATIVE/POSITIVE JYN:82956} L: {NEGATIVE/POSITIVE OZH:08657} Thomas (hip flexors): R: {NEGATIVE/POSITIVE QIO:96295} L: {NEGATIVE/POSITIVE MWU:13244} Ober: R: {NEGATIVE/POSITIVE WNU:27253} L: {NEGATIVE/POSITIVE GUY:40347}  Palpation Location Right Left         Lumbar paraspinals    Quadratus Lumborum    Gluteus Maximus    Gluteus Medius    Deep hip external rotators    PSIS    Fortin's Area (SIJ)    Greater Trochanter    (Blank rows = not tested) Graded on 0-4 scale (0 = no pain, 1 = pain, 2 = pain with wincing/grimacing/flinching, 3 = pain with withdrawal, 4 = unwilling to allow palpation)  Passive Accessory Intervertebral Motion Pt denies reproduction of back pain with CPA L1-L5 and UPA bilaterally L1-L5. Generally, hypomobile throughout  Special Tests Lumbar Radiculopathy and Discogenic: Centralization and Peripheralization (SN 92, -LR 0.12): {NEGATIVE/POSITIVE FOR:19998} Slump (SN 83, -LR 0.32): R: {NEGATIVE/POSITIVE FOR:19998} L:  {NEGATIVE/POSITIVE FOR:19998} SLR (SN 92, -LR 0.29): R: {NEGATIVE/POSITIVE QQV:95638} L:  {NEGATIVE/POSITIVE VFI:43329} Crossed SLR (SP 90): R: {NEGATIVE/POSITIVE JJO:84166} L: {NEGATIVE/POSITIVE AYT:01601}  Facet Joint: Extension-Rotation (SN 100, -LR 0.0): R: {NEGATIVE/POSITIVE UXN:23557} L: {NEGATIVE/POSITIVE DUK:02542}  Lumbar Foraminal Stenosis: Lumbar quadrant (SN 70): R: {NEGATIVE/POSITIVE HCW:23762} L: {NEGATIVE/POSITIVE GBT:51761}  Hip: FABER (SN 81): R: {NEGATIVE/POSITIVE FOR:19998} L: {NEGATIVE/POSITIVE YWV:37106} FADIR (SN 94): R: {NEGATIVE/POSITIVE FOR:19998} L: {NEGATIVE/POSITIVE YIR:48546} Hip scour (SN 50): R: {NEGATIVE/POSITIVE EVO:35009} L: {NEGATIVE/POSITIVE FGH:82993}  SIJ:  Thigh Thrust (SN 88, -LR 0.18) : R: {NEGATIVE/POSITIVE ZJI:96789} L: {NEGATIVE/POSITIVE FYB:01751}  Piriformis Syndrome: FAIR Test (SN 88, SP 83): R: {NEGATIVE/POSITIVE WCH:85277} L: {NEGATIVE/POSITIVE OEU:23536}  Functional Tasks Lifting:  Deep squat: Sit to stand: Forward Step-Down Test: R:  L:  Lateral Step-Down Test: R:  L:   Beighton scale  LEFT  RIGHT           1. Passive dorsiflexion and hyperextension of the fifth MCP joint beyond 90  0 0   2. Passive apposition of the thumb to the flexor aspect of the forearm  0  0   3. Passive hyperextension of the elbow beyond 10  0  0   4. Passive hyperextension of the knee beyond 10  0  0   5. Active forward flexion of the trunk with the knees fully extended so that the palms of the hands rest flat on the floor   0   TOTAL         0/ 9      TODAY'S TREATMENT: DATE: ***     PATIENT EDUCATION:  Education details: *** Person educated: {Person educated:25204} Education method: {Education Method:25205} Education comprehension: {Education Comprehension:25206}   HOME EXERCISE PROGRAM:     ASSESSMENT:  CLINICAL IMPRESSION: Patient is a *** y.o. *** who was seen today for physical therapy evaluation and treatment for ***.    OBJECTIVE IMPAIRMENTS: {opptimpairments:25111}.   ACTIVITY LIMITATIONS: {activitylimitations:27494}  PARTICIPATION LIMITATIONS: {participationrestrictions:25113}  PERSONAL FACTORS: {Personal factors:25162} are also affecting patient's functional outcome.   REHAB POTENTIAL: {rehabpotential:25112}  CLINICAL DECISION MAKING: {clinical decision making:25114}  EVALUATION COMPLEXITY: {Evaluation complexity:25115}   GOALS: Goals reviewed with patient? {yes/no:20286}  SHORT TERM GOALS: Target date: {follow up:25551}  Pt will be independent with HEP in order to improve strength and decrease back pain to improve pain-free function at home and work. Baseline: *** Goal status: INITIAL   LONG TERM GOALS: Target date: {follow up:25551}  Pt will increase FOTO to at least *** to demonstrate significant improvement in function at home and work related to back pain  Baseline:  Goal status: INITIAL  2.  Pt will decrease worst back pain by at least 2 points on the NPRS in order to demonstrate clinically significant reduction in back pain. Baseline: *** Goal status: INITIAL  3.  Pt will decrease mODI score by at least 13 points in order demonstrate clinically significant reduction in back pain/disability.       Baseline: *** Goal status: INITIAL  4.  *** Baseline: *** Goal status: INITIAL   PLAN: PT FREQUENCY: 1-2x/week  PT DURATION: {rehab duration:25117}  PLANNED INTERVENTIONS: Therapeutic exercises, Therapeutic activity, Neuromuscular re-education, Balance training, Gait training, Patient/Family education, Self Care, Joint mobilization, Joint manipulation, Vestibular training, Canalith repositioning, Orthotic/Fit training, DME instructions, Dry Needling, Electrical stimulation, Spinal manipulation, Spinal mobilization, Cryotherapy, Moist heat, Taping, Traction, Ultrasound, Ionotophoresis 4mg /ml Dexamethasone , Manual therapy, and Re-evaluation.  PLAN FOR NEXT SESSION:  ***   Candi Chafe PT, DPT, GCS  Aleatha Hunting, PT 04/30/2024, 7:47 AM

## 2024-05-02 ENCOUNTER — Encounter: Admitting: Physical Medicine and Rehabilitation

## 2024-05-07 ENCOUNTER — Ambulatory Visit: Admitting: Physical Therapy

## 2024-05-08 ENCOUNTER — Telehealth: Payer: Self-pay | Admitting: Neurosurgery

## 2024-05-08 NOTE — Telephone Encounter (Signed)
 C3-5 ACDF on 04/03/24   Patient is calling to see about increasing his Baclofen  and Gabapentin . Please advise.

## 2024-05-08 NOTE — Telephone Encounter (Signed)
 His medication list has the following medications that could be related to surgery:  Tylenol  650 every 6 hours Baclofen  20mg  TID Gabapentin  400mg  daily Norco 5-325mg  every 4 hours Methocarbamol  500mg  every 6 hours  I would like to speak with him to find out where his pain is/what his symptoms are and exactly what he is taking and how often he is taking each medication. I left a message requesting he call me back.

## 2024-05-09 NOTE — Telephone Encounter (Signed)
 Attempted to reach patient by phone. Went straight to [employer].   Advised patient to call back. Todays hours and call back number provided.

## 2024-05-09 NOTE — Telephone Encounter (Signed)
 Patient is requesting an increase in dosage of Robaxin  and baclofen  due to ongoing muscle spasms. He denies increased pain. 1/10 pain level that is sharp for just a few seconds when he moves a certain way. He says that this can occur anywhere in the body but most commonly his back or shoulders. He states that the muscle spasms and stiffness are impacting his mobility.   Patient is currently taking...   Baclofen  20mg  TID  Gabapentin  400mg  daily  Methocarbamol  500mg  every 6 hours PRN   Patient uses [facility] in [place], [place].

## 2024-05-09 NOTE — Telephone Encounter (Signed)
 Reviewed with Dr. Mont Antis. He should discuss this with PMR - he has f/u with Dr. Lovorn on Monday. Recommend he call their office today about medications.

## 2024-05-09 NOTE — Telephone Encounter (Signed)
 Patient notified that he should contact PMR regarding these medications or Discuss with Dr. Lovorn at his appointment on Monday.

## 2024-05-12 ENCOUNTER — Other Ambulatory Visit: Payer: Self-pay

## 2024-05-12 ENCOUNTER — Encounter: Admitting: Physical Medicine and Rehabilitation

## 2024-05-12 DIAGNOSIS — G959 Disease of spinal cord, unspecified: Secondary | ICD-10-CM

## 2024-05-13 ENCOUNTER — Ambulatory Visit
Admission: RE | Admit: 2024-05-13 | Discharge: 2024-05-13 | Disposition: A | Discharge status: Home / Self Care | Source: Ambulatory Visit | Attending: Neurosurgery | Admitting: Neurosurgery

## 2024-05-13 ENCOUNTER — Encounter: Payer: Self-pay | Admitting: Neurosurgery

## 2024-05-13 ENCOUNTER — Ambulatory Visit (INDEPENDENT_AMBULATORY_CARE_PROVIDER_SITE_OTHER): Admitting: Neurosurgery

## 2024-05-13 ENCOUNTER — Ambulatory Visit
Admission: RE | Admit: 2024-05-13 | Discharge: 2024-05-13 | Disposition: A | Discharge status: Home / Self Care | Attending: Neurosurgery | Admitting: Neurosurgery

## 2024-05-13 VITALS — BP 128/80 | Temp 99.7°F | Ht 69.0 in | Wt 211.0 lb

## 2024-05-13 DIAGNOSIS — G959 Disease of spinal cord, unspecified: Secondary | ICD-10-CM | POA: Insufficient documentation

## 2024-05-13 DIAGNOSIS — Z09 Encounter for follow-up examination after completed treatment for conditions other than malignant neoplasm: Secondary | ICD-10-CM

## 2024-05-13 NOTE — Progress Notes (Signed)
   DOS: 04/03/2024 (ACDF C3-5)  HISTORY OF PRESENT ILLNESS: 05/13/2024 [name] is status post ACDF for rapidly progressive myelopathy.  He was sent to inpatient rehab.  He has made improvements.  He is now walking with a walker most of the time but sometimes without a walker.  He is seeing Dr. Lovorn at physical medicine and rehabilitation.     PHYSICAL EXAMINATION:   Vitals:   05/13/24 1409  BP: 128/80  Temp: 99.7 F (37.6 C)   General: Patient is well developed, well nourished, calm, collected, and in no apparent distress.  NEUROLOGICAL:  General: In no acute distress.  Awake, alert, oriented to person, place, and time. Pupils equal round and reactive to light.   Strength: Side Biceps Triceps Deltoid Interossei Grip Wrist Ext. Wrist Flex.  R 4+ 4+ 4+ 4 4 4+ 4+  L 4 4 4  4- 3 4- 4-   Incision c/d/i   ROS (Neurologic): Negative except as noted above  IMAGING: No complications noted  ASSESSMENT/PLAN:  Nathaniel Hobbs is doing well after anterior cervical discectomy and fusion for rapidly progressive myelopathy.  He has made significant improvements.  I think will continue to improve.  He is working with therapy.  I have given him exercises for his neck.  Will see him back in approximately 6 weeks.  I spent a total of 10 minutes in this patient's care today. This time was spent reviewing pertinent records including imaging studies, obtaining and confirming history, performing a directed evaluation, formulating and discussing my recommendations, and documenting the visit within the medical record.    Jodeen Munch MD, [facility] Department of Neurosurgery

## 2024-05-14 ENCOUNTER — Encounter: Attending: Physical Medicine and Rehabilitation | Admitting: Physical Medicine and Rehabilitation

## 2024-05-14 ENCOUNTER — Encounter: Admitting: Physical Therapy

## 2024-05-14 ENCOUNTER — Encounter: Payer: Self-pay | Admitting: Physical Medicine and Rehabilitation

## 2024-05-14 VITALS — BP 128/81 | HR 56 | Ht 69.0 in | Wt 205.4 lb

## 2024-05-14 DIAGNOSIS — N521 Erectile dysfunction due to diseases classified elsewhere: Secondary | ICD-10-CM

## 2024-05-14 DIAGNOSIS — R252 Cramp and spasm: Secondary | ICD-10-CM

## 2024-05-14 DIAGNOSIS — G959 Disease of spinal cord, unspecified: Secondary | ICD-10-CM | POA: Diagnosis not present

## 2024-05-14 DIAGNOSIS — R269 Unspecified abnormalities of gait and mobility: Secondary | ICD-10-CM | POA: Diagnosis present

## 2024-05-14 DIAGNOSIS — E669 Obesity, unspecified: Secondary | ICD-10-CM | POA: Diagnosis not present

## 2024-05-14 MED ORDER — SILDENAFIL CITRATE 100 MG PO TABS: 100.0000 mg | ORAL_TABLET | ORAL | 5 refills | Status: AC | PRN

## 2024-05-14 MED ORDER — BACLOFEN 20 MG PO TABS: 30.0000 mg | ORAL_TABLET | Freq: Four times a day (QID) | ORAL | 5 refills | Status: AC

## 2024-05-14 NOTE — Progress Notes (Signed)
 Subjective:    Patient ID: Nathaniel Hobbs, male    DOB: 17-Oct-1961, 63 y.o.   MRN: [id number]  HPI  Pt is a 63 yr old male with hx of cervical stenosis s/p ACDF C3-5 04/03/24.  Pt also has mild neurogenic bowel  with intermittent constipation- and bladder with urgency; Spasticity; and HLD, HTN and Class 1 obesity; as well as CAD s/p stenting in 2013-  Here is here for hospital f/u on SCI  So stiff- all over-  is his spasticity.  Last 4 days, came on abruptly in back- giving him gas.  And it rained.  Easing up today that can move   Also having in back- hard to get out of seats/chairs, esp if low.  Didn't realize whole house is low seating.    Taking Baclofen  20 mg TID- "Turned into candy"- doesn't see any relief.   No pain- max 1-2/10- more from stiffness/spasticity not actual pain from neck. Nose has been running.  Thinks has allergies going on.  Rain changes barometric pressure- can also trigger more spasticity.   Takes Senna, so having BM's every day and not really hard.  Not sleepy- even after baclofen .   Wants to go up on spasticity meds.  Was walking fine and then spasticity came out of nowhere.    [facility]- Dr Jeris Montes said Xrays look great- so to f/u in 6 weeks.   Walking with RW outside the home-  but AD in home- doing showering, cooking, cleaning, and doing well.  Cannot put socks on- but hasn't given it a good try.  Dogs keep getting in the way  Missed appt last week- ride couldn't come. Walked 1000 ft with RW aND 500FT WITHOUT RW.  [facility]ing [facility]pt [facility] at [facility] til 8[facility]11   Pain Inventory Average Pain 1 Pain Right Now 1 My pain is dull and stabbing  LOCATION OF PAIN  Back  BOWEL Number of stools per week: 5 Oral laxative use Yes  Type of laxative Senna  Enema or suppository use No  History of colostomy No  Incontinent No   BLADDER Normal In and out cath, frequency No Able to self cath N/A Bladder incontinence No  Frequent urination Yes   Leakage with coughing No  Difficulty starting stream Yes  Incomplete bladder emptying Yes    Mobility use a walker, not currently working, able to climb stairs with assistance  Function not employed: date last employed Currently not employeed  Neuro/Psych numbness spasms  Prior Studies Any changes since last visit?  no  Physicians involved in your care Primary care Dr. [place] Amor Neurologist Dr. Jeris Montes   No family history on file. Social History   Socioeconomic History   Marital status: Married    Spouse name: Not on file   Number of children: Not on file   Years of education: Not on file   Highest education level: Not on file  Occupational History   Not on file  Tobacco Use   Smoking status: Never   Smokeless tobacco: Never  Vaping Use   Vaping status: Never Used  Substance and Sexual Activity   Alcohol use: Never   Drug use: Never   Sexual activity: Not on file  Other Topics Concern   Not on file  Social History Narrative   Not on file   Social Drivers of Health   Financial Resource Strain: Low Risk  (03/07/2024)   Received from [facility]   Overall Financial Resource Strain (CARDIA)  Difficulty of Paying Living Expenses: Not very hard  Food Insecurity: No Food Insecurity (04/04/2024)   Hunger Vital Sign    Worried About Running Out of Food in the Last Year: Never true    Ran Out of Food in the Last Year: Never true  Transportation Needs: No Transportation Needs (04/04/2024)   PRAPARE - Administrator, Civil Service (Medical): No    Lack of Transportation (Non-Medical): No  Physical Activity: Not on file  Stress: Not on file  Social Connections: Not on file   Past Surgical History:  Procedure Laterality Date   ANTERIOR CERVICAL DECOMP/DISCECTOMY FUSION N/A 04/03/2024   Procedure: ANTERIOR CERVICAL DECOMPRESSION/DISCECTOMY FUSION 2 LEVELS;  Surgeon: Jodeen Munch, MD;  Location: [facility] ORS;  Service:  Neurosurgery;  Laterality: N/A;   Past Medical History:  Diagnosis Date   High cholesterol    Hypertension    BP 128/81 (Cuff Size: Large)   Pulse (!) 56   Ht 5\' 9"  (1.753 m)   Wt 205 lb 6.4 oz (93.2 kg)   SpO2 98%   BMI 30.33 kg/m   Opioid Risk Score:   Fall Risk Score:  `1  Depression screen [facility] 2/9     05/14/2024    8:46 AM  Depression screen [facility] 2/9  Decreased Interest 0  Down, Depressed, Hopeless 0  PHQ - 2 Score 0      Review of Systems  Musculoskeletal:  Positive for back pain.       Back pain with spasms  Neurological:  Positive for numbness.       Spinal surgery in April 2025 Numbness bilateral arms  All other systems reviewed and are negative.      Objective:   Physical Exam  Awake, alert appropriate; using RW, NAD MSK:  RUE- Biceps 5-/5; WE 5-/5; Triceps 5-/5 Grip 4/5 FA3+/5 LUE- Biceps, WE, triceps 5-/5; Grip 3+ to 4-/5 and FA 3/5  RLE- HF 4/5; KE/KF 4+/5; DF and PF 4+/5 LLE- HF 4-/5; KE/KF 4+/5 DF and PF 4+ /5 Osgood Schlotters of B/L knees- large bony prominence B/L  Neuro: Decreased to LT at L L3, but no other location in dermatomes DTR's-RUE 2+; LUE 3+/5 LE- 2+ B/L RUE MAS of 1 with Hoffman's LUE_ MAS of 2 with Brisk hoffman's  RLE-MAS of 1 to 1+ in RLE- with no clonus LLE_ MAS of 2 esp at knee and ankle- with 5-6 beats of clonus  Tremor/clonus of L hand/wrist noted as well     Assessment & Plan:   Pt is a 63 yr old male with hx of cervical stenosis s/p ACDF C3-5 04/03/24.  Pt also has mild neurogenic bowel and bladder with urgency; Spasticity; and HLD, HTN and Class 1 obesity; as well as CAD s/p stenting in 2013-  Here is here for hospital f/u on SCI   [facility] support group- [address]- last Thursday of the month- 6-7 pm-  1st floor conference room-    2. Call Dr Jeris Montes about driving- - not in collar- I think strength is ok to drive- I think your spasticity needs to get better before we have you drive.    3. Increase  your Baclofen  to 30 mg 3x/day-  but can take  up to 40 mg 3x/day if needed- based on the way I wrote the Rx.   4. Call me in ~ 2 weeks  to let me know if this is working or not-   And can add Dantrolene. Vs Zanaflex/Tizanidine  5. Might need to increase you Senna- depending on Symptoms.    6. 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 Does progress for 1-2 years-    7. The more you stretch, the more you will be looser- for ~ 4-6 hours.  Treats spasticity- temporarily. With meds, will be more effective.   8.   For ED, will give Rx for Viagra- can usually get for ~$20 for  at [facility]- with good Rx.    9. F/U - 3 months - on spasticity- due to cervical myelopathy   I spent a total of  40  minutes on total care today- >50% coordination of care- due to prolonged education on spasticity erectile dysfunction and other meds can use- decide don Baclofen  increase for now-

## 2024-05-14 NOTE — Patient Instructions (Signed)
 Pt is a 63 yr old male with hx of cervical stenosis s/p ACDF C3-5 04/03/24.  Pt also has mild neurogenic bowel and bladder with urgency; Spasticity; and HLD, HTN and Class 1 obesity; as well as CAD s/p stenting in 2013-  Here is here for hospital f/u on SCI   [facility] support group- [address]- last Thursday of the month- 6-7 pm-  1st floor conference room-    2. Call Dr Jeris Montes about driving- - not in collar- I think strength is ok to drive- I think your spasticity needs to get better before we have you drive.    3. Increase your Baclofen  to 30 mg 3x/day-  but can take  up to 40 mg 3x/day if needed- based on the way I wrote the Rx.   4. Call me in ~ 2 weeks  to let me know if this is working or not-   And can add Dantrolene. Vs Zanaflex/Tizanidine    5. Might need to increase you Senna- depending on Symptoms.    6. 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 Does progress for 1-2 years-    7. The more you stretch, the more you will be looser- for ~ 4-6 hours.  Treats spasticity- temporarily. With meds, will be more effective.   8.   For ED, will give Rx for Viagra- can usually get for ~$20 for  at [facility]- with good Rx.    9. F/U - 3 months - on spasticity- due to cervical myelopathy

## 2024-05-21 ENCOUNTER — Encounter: Admitting: Physical Therapy

## 2024-05-26 ENCOUNTER — Encounter: Admitting: Physical Therapy

## 2024-05-28 ENCOUNTER — Encounter: Admitting: Physical Therapy

## 2024-06-02 ENCOUNTER — Encounter: Admitting: Physical Therapy

## 2024-06-04 ENCOUNTER — Encounter: Admitting: Physical Therapy

## 2024-06-06 ENCOUNTER — Telehealth: Payer: Self-pay

## 2024-06-06 NOTE — Telephone Encounter (Signed)
 Requesting Referral to physical therapy [facility] [address] [place], [place] ph# [phone].

## 2024-06-09 ENCOUNTER — Encounter: Admitting: Physical Therapy

## 2024-06-10 ENCOUNTER — Telehealth: Payer: Self-pay

## 2024-06-10 NOTE — Telephone Encounter (Signed)
 Pt stated that he was referred to physical therapy and wanted to know how was it going? He explained that he has not received a call yet for his appointment.

## 2024-06-11 ENCOUNTER — Encounter: Admitting: Physical Therapy

## 2024-06-13 ENCOUNTER — Other Ambulatory Visit: Payer: Self-pay | Admitting: Orthopedic Surgery

## 2024-06-13 DIAGNOSIS — Z981 Arthrodesis status: Secondary | ICD-10-CM

## 2024-06-13 DIAGNOSIS — G959 Disease of spinal cord, unspecified: Secondary | ICD-10-CM

## 2024-06-13 NOTE — Progress Notes (Signed)
   DOS: 04/03/2024  ACDF C3-C5 for rapidly progressive myelopathy  HISTORY OF PRESENT ILLNESS: He is seeing Dr. Cornelio with PMR. He was doing better at his last visit with Dr. Clois.   He is doing much better. His walking has improved- he was walker today but has been using a cane. He feels like strength is improving in both hands/arms. He can shave with left hand!  He is done with formal PT, but has been doing HEP. He continues to see Dr. Cornelio- has follow up in September.   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 He will let me know if we need to push this back.    PHYSICAL EXAMINATION:   Vitals:   06/24/24 1442  BP: 134/84  Temp: 99 F (37.2 C)    General: Patient is well developed, well nourished, calm, collected, and in no apparent distress.  NEUROLOGICAL:  General: In no acute distress.  Awake, alert, oriented to person, place, and time. Pupils equal round and reactive to light.   Strength: Side Biceps Triceps Deltoid Interossei Grip Wrist Ext. Wrist Flex.  R 5 4+ 5 5 4+ 5 5  L 5 5 5  4- 4 4- 5   Incision well healed  ROS (Neurologic): Negative except as noted above  IMAGING: none  ASSESSMENT/PLAN:  Nathaniel Hobbs is doing well after anterior cervical discectomy and fusion for rapidly progressive myelopathy.  His mobility and strength has improved.   Treatment options reviewed with patient and following plan made:   - Continue with activity as tolerated.  - He will continue with HEP from PT. Will let me know if he wants to reorder PT at [facility] in [place].  - Continue to follow up with Dr. Lovorn as scheduled.  - He is schedule to return to work 07/14/24. Will let me know if we need to push this back another 4-6 weeks.  - He will get cervical xrays tomorrow. I will send him a message after I review them.  - Follow up with Dr. Clois in 6 months and prn.   Glade Boys PA-C Department  of Neurosurgery

## 2024-06-16 ENCOUNTER — Encounter: Admitting: Physical Therapy

## 2024-06-18 ENCOUNTER — Encounter: Admitting: Physical Therapy

## 2024-06-20 ENCOUNTER — Other Ambulatory Visit: Payer: Self-pay

## 2024-06-20 DIAGNOSIS — G959 Disease of spinal cord, unspecified: Secondary | ICD-10-CM

## 2024-06-20 NOTE — Progress Notes (Signed)
 ERROR

## 2024-06-23 ENCOUNTER — Encounter: Admitting: Physical Therapy

## 2024-06-24 ENCOUNTER — Encounter: Admitting: Neurosurgery

## 2024-06-24 ENCOUNTER — Ambulatory Visit (INDEPENDENT_AMBULATORY_CARE_PROVIDER_SITE_OTHER): Admitting: Orthopedic Surgery

## 2024-06-24 ENCOUNTER — Encounter: Payer: Self-pay | Admitting: Orthopedic Surgery

## 2024-06-24 VITALS — BP 134/84 | Temp 99.0°F | Ht 69.0 in | Wt 205.0 lb

## 2024-06-24 DIAGNOSIS — Z981 Arthrodesis status: Secondary | ICD-10-CM

## 2024-06-24 DIAGNOSIS — G959 Disease of spinal cord, unspecified: Secondary | ICD-10-CM

## 2024-06-25 ENCOUNTER — Ambulatory Visit
Admission: RE | Admit: 2024-06-25 | Discharge: 2024-06-25 | Disposition: A | Discharge status: Home / Self Care | Attending: Orthopedic Surgery | Admitting: Orthopedic Surgery

## 2024-06-25 ENCOUNTER — Ambulatory Visit
Admission: RE | Admit: 2024-06-25 | Discharge: 2024-06-25 | Disposition: A | Discharge status: Home / Self Care | Source: Ambulatory Visit | Attending: Orthopedic Surgery | Admitting: Orthopedic Surgery

## 2024-06-25 ENCOUNTER — Encounter: Admitting: Physical Therapy

## 2024-06-25 ENCOUNTER — Encounter: Payer: Self-pay | Admitting: Orthopedic Surgery

## 2024-06-25 DIAGNOSIS — G959 Disease of spinal cord, unspecified: Secondary | ICD-10-CM

## 2024-06-25 DIAGNOSIS — Z981 Arthrodesis status: Secondary | ICD-10-CM

## 2024-06-25 NOTE — Telephone Encounter (Signed)
 Cervical xrays dated 06/25/24:  No complications noted.   No report yet for above xrays.   Message sent to patient.

## 2024-06-30 NOTE — Telephone Encounter (Signed)
 L/m for pt on voicemail that per the chart he was going to be in PT til 8/11, so I'm confused about the PT- am I missing something?  I didn't see anything in my note- to suggest that I was writing for more therapy.   Waiting to hear back from pt.

## 2024-07-18 ENCOUNTER — Encounter: Payer: Self-pay | Admitting: Neurosurgery

## 2024-07-25 ENCOUNTER — Other Ambulatory Visit: Payer: Self-pay

## 2024-07-25 MED ORDER — HYDROCODONE-ACETAMINOPHEN 5-325 MG PO TABS: 1.0000 | ORAL_TABLET | Freq: Three times a day (TID) | ORAL | 0 refills | Status: AC | PRN

## 2024-08-29 ENCOUNTER — Telehealth: Payer: Self-pay | Admitting: Physical Medicine and Rehabilitation

## 2024-08-29 ENCOUNTER — Encounter: Attending: Physical Medicine and Rehabilitation | Admitting: Physical Medicine and Rehabilitation

## 2024-08-29 ENCOUNTER — Encounter: Payer: Self-pay | Admitting: Physical Medicine and Rehabilitation

## 2024-08-29 VITALS — BP 160/90 | HR 53 | Ht 69.0 in | Wt 197.8 lb

## 2024-08-29 DIAGNOSIS — G959 Disease of spinal cord, unspecified: Secondary | ICD-10-CM | POA: Insufficient documentation

## 2024-08-29 DIAGNOSIS — R531 Weakness: Secondary | ICD-10-CM | POA: Diagnosis not present

## 2024-08-29 DIAGNOSIS — R252 Cramp and spasm: Secondary | ICD-10-CM | POA: Insufficient documentation

## 2024-08-29 DIAGNOSIS — R269 Unspecified abnormalities of gait and mobility: Secondary | ICD-10-CM | POA: Insufficient documentation

## 2024-08-29 MED ORDER — BACLOFEN 10 MG PO TABS: 10.0000 mg | ORAL_TABLET | Freq: Four times a day (QID) | ORAL | 1 refills | Status: AC

## 2024-08-29 NOTE — Progress Notes (Signed)
 Nathaniel Hobbs

## 2024-08-29 NOTE — Patient Instructions (Signed)
 Pt is a 63 yr old male with hx of cervical stenosis s/p ACDF C3-5 04/03/24.  Pt also has mild neurogenic bowel  with intermittent constipation- and bladder with urgency; Spasticity; and HLD, HTN and Class 1 obesity; as well as CAD s/p stenting in 2013-  Here is here for  f/u on SCI    Will decrease baclofen   to 10 mg 4x/day-  for spasticity, but won't decrease dose below 20 mg 3x/day-  since might need more if gets sick/has an infection.   -sending in [facility] Rx for 10 mg 3x/day plus 1x/day for colds as needed   2. Suggest getting a  L knee sleeve/brace to help support the knee.   3. Stretching daily is KEY to improving spasticity  esp if not due  for meds.    4. Decreasing the dose of Baclofen  will decrease constipation to some extent  5. Still has mild Neurogenic bowel -taking PO meds for this. I agree with his regimen.   6. Didn't need Viagra - so will only give if needs in future.   7. F/U in 3 months double appt- SCI   8. Stretching 1-2x/day- I would rather you do less stretching more often.

## 2024-08-29 NOTE — Progress Notes (Signed)
 Subjective:    Patient ID: Nathaniel Hobbs, male    DOB: [birth year], 63 y.o.   MRN: [id number]  HPI  Pt is a 63 yr old L handed male with hx of cervical stenosis s/p ACDF C3-5 04/03/24.  Pt also has mild neurogenic bowel  with intermittent constipation- and bladder with urgency; Spasticity; and HLD, HTN and Class 1 obesity; as well as CAD s/p stenting in 2013-  Here is here for  f/u on SCI  BP elevated to 160/90 this AM- will recheck-   Going back to work 9/22-  back to traveling and doing jobs again- hopefully.  Didn't get OK from [facility] yet-   Finished with therapy 2 months ago- insurance wouldn't let him go more than 6 weeks,  Works on doing HEP- doing every other day- seeing improvements- not fast but better.    Using Quad cane for long distances- nervousness- in crowds- doesn't want to get bumped and fall- doesn't need on  flat surfaces- or in home-  Doing steps at home- 30 x on each leg- 60x total-  some days cannot do all of the them due to back tightness- occ hamstring tightness as well.  Could feel a catch.    We increased baclofen  but made him go asleep  immediately on 30 mg TID.  And when you take 20 mg- 1 hour later, limbs feel gooey-  Too loosey- goosey- and walking is weird- has to balance himself to look where he's stepping- like legs owuld give if not careful. Only taking 1x/day.   Took 1x yesterday and was stiff this AM.   Although feels stiff, not as stiff as used to be.  Can actually bend to put socks on and touch feet! Couldn't for 5 years.   Knows if has cold, it makes spasticity SO much worse! Magnifies the Sx's- he's noticed- had a cold last week.    Pain- still has Norco- only 4 tabs since got it 07/25/24- Occ tiny sharp pains in neck-  Takes once in awhile.   Bowel/bladder-  If haven't had a BM one day, then goes the next day- with Senna- - and sometimes will take 3 tabs if needed.    LLE sometimes gives way-  is hyperextends the L knee-   Not  stretching every day And notices it!  Can now eat, write with L hand now and cook and cut with it now.  Both hands becoming less numb- but L slower than R>  Peeing fine!  Pain Inventory Average Pain 2 Pain Right Now 2 My pain is dull  In the last 24 hours, has pain interfered with the following? General activity 1 Relation with others 0 Enjoyment of life 0 What TIME of day is your pain at its worst? varies Sleep (in general) Fair  Pain is worse with: bending Pain improves with: therapy/exercise Relief from Meds: 5  No family history on file. Social History   Socioeconomic History   Marital status: Married    Spouse name: Not on file   Number of children: Not on file   Years of education: Not on file   Highest education level: Not on file  Occupational History   Not on file  Tobacco Use   Smoking status: Never   Smokeless tobacco: Never  Vaping Use   Vaping status: Never Used  Substance and Sexual Activity   Alcohol use: Never   Drug use: Never   Sexual activity: Not on file  Other Topics  Concern   Not on file  Social History Narrative   Not on file   Social Drivers of Health   Financial Resource Strain: Low Risk  (03/07/2024)   Received from [facility]   Overall Financial Resource Strain (CARDIA)    Difficulty of Paying Living Expenses: Not very hard  Food Insecurity: No Food Insecurity (04/04/2024)   Hunger Vital Sign    Worried About Running Out of Food in the Last Year: Never true    Ran Out of Food in the Last Year: Never true  Transportation Needs: No Transportation Needs (04/04/2024)   PRAPARE - Administrator, Civil Service (Medical): No    Lack of Transportation (Non-Medical): No  Physical Activity: Not on file  Stress: Not on file  Social Connections: Not on file   Past Surgical History:  Procedure Laterality Date   ANTERIOR CERVICAL DECOMP/DISCECTOMY FUSION N/A 04/03/2024   Procedure: ANTERIOR CERVICAL  DECOMPRESSION/DISCECTOMY FUSION 2 LEVELS;  Surgeon: Clois Fret, MD;  Location: [facility] ORS;  Service: Neurosurgery;  Laterality: N/A;   Past Surgical History:  Procedure Laterality Date   ANTERIOR CERVICAL DECOMP/DISCECTOMY FUSION N/A 04/03/2024   Procedure: ANTERIOR CERVICAL DECOMPRESSION/DISCECTOMY FUSION 2 LEVELS;  Surgeon: Clois Fret, MD;  Location: [facility] ORS;  Service: Neurosurgery;  Laterality: N/A;   Past Medical History:  Diagnosis Date   High cholesterol    Hypertension    BP (!) 166/96   Pulse (!) 53   Ht 5' 9 (1.753 m)   Wt 197 lb 12.8 oz (89.7 kg)   SpO2 98%   BMI 29.21 kg/m   Opioid Risk Score:   Fall Risk Score:  `1  Depression screen [facility] 2/9     08/29/2024    9:08 AM 05/14/2024    8:46 AM  Depression screen PH[facility] 2/9  Decreased Interest 1 0  Down, Depressed, Hopeless 1 0  PHQ - 2 Score 2 0     Review of Systems  Musculoskeletal:  Positive for arthralgias, back pain and gait problem.  All other systems reviewed and are negative.      Objective:   Physical Exam Awake, alert, appropriate, using quad cane, NAD  MSK: RUE- 5-/5 in deltoid, biceps, triceps; WE 5-/5 and grip 5-/5 and FA 4+/5 LUE- deltoid 5-/5; Biceps/triceps also 5-/5 but better than R; WE 5/5; grip 5-/5 slightly better FA 4/5 RLE- 4+/5 throughout LLE- 4/5 in HF; otherwise 4+/5  Neuro: MAS of 1 in RUE; and 2 in L shoulder, 1+ in elbow; 2 in L wrist and hand Hoffman's B/L UEs- L more brisk MAS  1+ IN LE's- B/L- throughout.  NO clonus B/L         Assessment & Plan:   Pt is a 63 yr old male with hx of cervical stenosis s/p ACDF C3-5 04/03/24.  Pt also has mild neurogenic bowel  with intermittent constipation- and bladder with urgency; Spasticity; and HLD, HTN and Class 1 obesity; as well as CAD s/p stenting in 2013-  Here is here for  f/u on SCI    Will decrease baclofen   to 10 mg 4x/day-  for spasticity, but won't decrease dose below 20 mg 3x/day-  since might need more  if gets sick/has an infection.   -sending in [facility] Rx for 10 mg 3x/day plus 1x/day for colds as needed   2. Suggest getting a  L knee sleeve/brace to help support the knee.   3. Stretching daily is KEY to improving spasticity  esp if not due  for meds.    4. Decreasing the dose of Baclofen  will decrease constipation to some extent  5. Still has mild Neurogenic bowel -taking PO meds for this. I agree with his regimen.   6. Didn't need Viagra - so will only give if needs in future.   7. F/U in 3 months double appt- SCI   8. Stretching 1-2x/day- I would rather you do less stretching more often.    I spent a total of  31  minutes on total care today- >50% coordination of care- due to d/w and demonstration of ROM-/stretching; also review of how to address knee buckling and other meds

## 2024-11-28 ENCOUNTER — Encounter: Payer: Self-pay | Attending: Physical Medicine and Rehabilitation | Admitting: Physical Medicine and Rehabilitation

## 2024-11-28 DIAGNOSIS — R269 Unspecified abnormalities of gait and mobility: Secondary | ICD-10-CM | POA: Insufficient documentation

## 2024-11-28 DIAGNOSIS — R252 Cramp and spasm: Secondary | ICD-10-CM | POA: Insufficient documentation

## 2024-11-28 DIAGNOSIS — G959 Disease of spinal cord, unspecified: Secondary | ICD-10-CM | POA: Insufficient documentation

## 2024-11-28 DIAGNOSIS — R531 Weakness: Secondary | ICD-10-CM | POA: Insufficient documentation

## 2024-12-16 ENCOUNTER — Other Ambulatory Visit: Payer: Self-pay | Admitting: Neurosurgery

## 2024-12-16 DIAGNOSIS — G959 Disease of spinal cord, unspecified: Secondary | ICD-10-CM

## 2024-12-30 ENCOUNTER — Ambulatory Visit: Admitting: Neurosurgery

## 2024-12-30 ENCOUNTER — Other Ambulatory Visit
